# Patient Record
Sex: Female | Born: 1950
Health system: Southern US, Community
[De-identification: ages and names within clinical notes are randomized; demographics above are authoritative.]

## PROBLEM LIST (undated history)

## (undated) DIAGNOSIS — F419 Anxiety disorder, unspecified: Secondary | ICD-10-CM

## (undated) DIAGNOSIS — I10 Essential (primary) hypertension: Secondary | ICD-10-CM

## (undated) DIAGNOSIS — E785 Hyperlipidemia, unspecified: Secondary | ICD-10-CM

## (undated) DIAGNOSIS — F32A Depression, unspecified: Secondary | ICD-10-CM

## (undated) DIAGNOSIS — T7840XA Allergy, unspecified, initial encounter: Secondary | ICD-10-CM

## (undated) DIAGNOSIS — G5601 Carpal tunnel syndrome, right upper limb: Secondary | ICD-10-CM

## (undated) DIAGNOSIS — J302 Other seasonal allergic rhinitis: Secondary | ICD-10-CM

## (undated) DIAGNOSIS — M1990 Unspecified osteoarthritis, unspecified site: Secondary | ICD-10-CM

## (undated) DIAGNOSIS — K635 Polyp of colon: Secondary | ICD-10-CM

## (undated) DIAGNOSIS — Z86010 Personal history of colonic polyps: Secondary | ICD-10-CM

## (undated) DIAGNOSIS — K219 Gastro-esophageal reflux disease without esophagitis: Secondary | ICD-10-CM

## (undated) HISTORY — PX: OVARIAN CYST REMOVAL: SHX89

## (undated) HISTORY — PX: OOPHORECTOMY: SHX86

## (undated) HISTORY — DX: Personal history of colonic polyps: Z86.010

## (undated) HISTORY — DX: Polyp of colon: K63.5

## (undated) HISTORY — DX: Allergy, unspecified, initial encounter: T78.40XA

## (undated) HISTORY — PX: CHOLECYSTECTOMY: SHX55

## (undated) HISTORY — PX: COLONOSCOPY: SHX174

## (undated) HISTORY — PX: TONSILLECTOMY: SUR1361

## (undated) HISTORY — PX: TUBAL LIGATION: SHX77

## (undated) HISTORY — DX: Depression, unspecified: F32.A

## (undated) HISTORY — PX: ABDOMINAL HYSTERECTOMY: SHX81

## (undated) HISTORY — PX: COSMETIC SURGERY: SHX468

## (undated) HISTORY — PX: BREAST SURGERY: SHX581

## (undated) HISTORY — PX: EYE SURGERY: SHX253

## (undated) HISTORY — PX: POLYPECTOMY: SHX149

## (undated) HISTORY — DX: Essential (primary) hypertension: I10

## (undated) HISTORY — DX: Hyperlipidemia, unspecified: E78.5

## (undated) HISTORY — PX: WISDOM TOOTH EXTRACTION: SHX21

## (undated) HISTORY — DX: Gastro-esophageal reflux disease without esophagitis: K21.9

## (undated) HISTORY — PX: APPENDECTOMY: SHX54

---

## 1995-02-17 ENCOUNTER — Encounter: Payer: Self-pay | Admitting: Physician Assistant

## 1998-04-28 ENCOUNTER — Ambulatory Visit (HOSPITAL_COMMUNITY): Admission: RE | Admit: 1998-04-28 | Discharge: 1998-04-28 | Payer: Self-pay | Admitting: *Deleted

## 1998-07-30 ENCOUNTER — Other Ambulatory Visit: Admission: RE | Admit: 1998-07-30 | Discharge: 1998-07-30 | Payer: Self-pay | Admitting: *Deleted

## 1999-02-19 ENCOUNTER — Other Ambulatory Visit: Admission: RE | Admit: 1999-02-19 | Discharge: 1999-02-19 | Payer: Self-pay | Admitting: *Deleted

## 1999-04-07 ENCOUNTER — Inpatient Hospital Stay (HOSPITAL_COMMUNITY): Admission: RE | Admit: 1999-04-07 | Discharge: 1999-04-09 | Payer: Self-pay | Admitting: *Deleted

## 1999-05-20 ENCOUNTER — Encounter: Payer: Self-pay | Admitting: *Deleted

## 1999-05-20 ENCOUNTER — Ambulatory Visit (HOSPITAL_COMMUNITY): Admission: RE | Admit: 1999-05-20 | Discharge: 1999-05-20 | Payer: Self-pay | Admitting: *Deleted

## 1999-06-09 ENCOUNTER — Encounter: Payer: Self-pay | Admitting: *Deleted

## 1999-06-09 ENCOUNTER — Ambulatory Visit (HOSPITAL_COMMUNITY): Admission: RE | Admit: 1999-06-09 | Discharge: 1999-06-09 | Payer: Self-pay | Admitting: *Deleted

## 1999-09-08 ENCOUNTER — Other Ambulatory Visit: Admission: RE | Admit: 1999-09-08 | Discharge: 1999-09-08 | Payer: Self-pay | Admitting: *Deleted

## 2000-06-19 ENCOUNTER — Ambulatory Visit (HOSPITAL_COMMUNITY): Admission: RE | Admit: 2000-06-19 | Discharge: 2000-06-19 | Payer: Self-pay | Admitting: *Deleted

## 2000-06-19 ENCOUNTER — Encounter: Payer: Self-pay | Admitting: *Deleted

## 2000-09-12 ENCOUNTER — Other Ambulatory Visit: Admission: RE | Admit: 2000-09-12 | Discharge: 2000-09-12 | Payer: Self-pay | Admitting: *Deleted

## 2001-07-04 ENCOUNTER — Encounter: Payer: Self-pay | Admitting: *Deleted

## 2001-07-04 ENCOUNTER — Ambulatory Visit (HOSPITAL_COMMUNITY): Admission: RE | Admit: 2001-07-04 | Discharge: 2001-07-04 | Payer: Self-pay | Admitting: *Deleted

## 2001-09-10 ENCOUNTER — Other Ambulatory Visit: Admission: RE | Admit: 2001-09-10 | Discharge: 2001-09-10 | Payer: Self-pay | Admitting: *Deleted

## 2002-07-17 ENCOUNTER — Encounter: Payer: Self-pay | Admitting: *Deleted

## 2002-07-17 ENCOUNTER — Ambulatory Visit (HOSPITAL_COMMUNITY): Admission: RE | Admit: 2002-07-17 | Discharge: 2002-07-17 | Payer: Self-pay | Admitting: *Deleted

## 2002-09-09 ENCOUNTER — Other Ambulatory Visit: Admission: RE | Admit: 2002-09-09 | Discharge: 2002-09-09 | Payer: Self-pay | Admitting: *Deleted

## 2003-07-23 ENCOUNTER — Encounter: Payer: Self-pay | Admitting: *Deleted

## 2003-07-23 ENCOUNTER — Ambulatory Visit (HOSPITAL_COMMUNITY): Admission: RE | Admit: 2003-07-23 | Discharge: 2003-07-23 | Payer: Self-pay | Admitting: *Deleted

## 2003-09-22 ENCOUNTER — Other Ambulatory Visit: Admission: RE | Admit: 2003-09-22 | Discharge: 2003-09-22 | Payer: Self-pay | Admitting: *Deleted

## 2004-07-23 ENCOUNTER — Ambulatory Visit (HOSPITAL_COMMUNITY): Admission: RE | Admit: 2004-07-23 | Discharge: 2004-07-23 | Payer: Self-pay | Admitting: *Deleted

## 2004-09-22 ENCOUNTER — Other Ambulatory Visit: Admission: RE | Admit: 2004-09-22 | Discharge: 2004-09-22 | Payer: Self-pay | Admitting: *Deleted

## 2005-07-25 ENCOUNTER — Ambulatory Visit (HOSPITAL_COMMUNITY): Admission: RE | Admit: 2005-07-25 | Discharge: 2005-07-25 | Payer: Self-pay | Admitting: *Deleted

## 2005-09-22 ENCOUNTER — Other Ambulatory Visit: Admission: RE | Admit: 2005-09-22 | Discharge: 2005-09-22 | Payer: Self-pay | Admitting: *Deleted

## 2006-08-14 ENCOUNTER — Ambulatory Visit (HOSPITAL_COMMUNITY): Admission: RE | Admit: 2006-08-14 | Discharge: 2006-08-14 | Payer: Self-pay | Admitting: *Deleted

## 2006-09-26 ENCOUNTER — Other Ambulatory Visit: Admission: RE | Admit: 2006-09-26 | Discharge: 2006-09-26 | Payer: Self-pay | Admitting: *Deleted

## 2006-12-12 ENCOUNTER — Ambulatory Visit: Payer: Self-pay | Admitting: Internal Medicine

## 2006-12-13 LAB — CONVERTED CEMR LAB
ALT: 25 units/L (ref 0–40)
AST: 29 units/L (ref 0–37)
Basophils Relative: 0.3 % (ref 0.0–1.0)
Bilirubin Urine: NEGATIVE
Eosinophils Relative: 3.3 % (ref 0.0–5.0)
GFR calc Af Amer: 84 mL/min
HDL: 45 mg/dL (ref >39.0)
Hemoglobin, Urine: NEGATIVE
Hemoglobin: 14.3 g/dL (ref 12.0–15.0)
Leukocytes, UA: NEGATIVE
MCHC: 32.8 g/dL (ref 30.0–36.0)
Monocytes Absolute: 0.7 10*3/uL (ref 0.2–0.7)
Neutrophils Relative %: 41.7 % — ABNORMAL LOW (ref 43.0–77.0)
Potassium: 4 meq/L (ref 3.5–5.1)
RBC: 4.46 M/uL (ref 3.87–5.11)
RDW: 12.3 % (ref 11.5–14.6)
Sodium: 141 meq/L (ref 135–145)
Total Bilirubin: 1 mg/dL (ref 0.3–1.2)
Total Protein, Urine: NEGATIVE mg/dL
Total Protein: 7.1 g/dL (ref 6.0–8.3)
Urine Glucose: NEGATIVE mg/dL
Urobilinogen, UA: 0.2 (ref 0.0–1.0)
WBC: 5.6 10*3/uL (ref 4.5–10.5)
pH: 6.5 (ref 5.0–8.0)

## 2006-12-29 ENCOUNTER — Ambulatory Visit: Payer: Self-pay | Admitting: Internal Medicine

## 2007-02-05 ENCOUNTER — Ambulatory Visit: Payer: Self-pay | Admitting: Internal Medicine

## 2007-02-05 LAB — CONVERTED CEMR LAB
ALT: 25 units/L (ref 0–40)
BUN: 7 mg/dL (ref 6–23)
Bilirubin, Direct: 0.2 mg/dL (ref 0.0–0.3)
CO2: 29 meq/L (ref 19–32)
Cholesterol: 190 mg/dL (ref 0–200)
Direct LDL: 121.8 mg/dL
GFR calc non Af Amer: 92 mL/min
HDL: 47.7 mg/dL (ref >39.0)
Potassium: 3.9 meq/L (ref 3.5–5.1)
Total CK: 293 units/L (ref 7–177)
VLDL: 20 mg/dL (ref 0–40)

## 2007-02-09 ENCOUNTER — Ambulatory Visit: Payer: Self-pay | Admitting: Internal Medicine

## 2007-05-15 ENCOUNTER — Ambulatory Visit: Payer: Self-pay | Admitting: Internal Medicine

## 2007-05-15 LAB — CONVERTED CEMR LAB
Cholesterol: 240 mg/dL (ref 0–200)
VLDL: 27 mg/dL (ref 0–40)

## 2007-05-18 ENCOUNTER — Ambulatory Visit: Payer: Self-pay | Admitting: Internal Medicine

## 2007-08-17 ENCOUNTER — Ambulatory Visit (HOSPITAL_COMMUNITY): Admission: RE | Admit: 2007-08-17 | Discharge: 2007-08-17 | Payer: Self-pay | Admitting: *Deleted

## 2007-09-03 ENCOUNTER — Other Ambulatory Visit: Admission: RE | Admit: 2007-09-03 | Discharge: 2007-09-03 | Payer: Self-pay | Admitting: *Deleted

## 2007-11-06 IMAGING — MG MM DIGITAL SCREENING BILAT
3 series · 3 of 3 positions shown · non-contrast
Comparison: none

DG SCREEN MAMMOGRAM BILATERAL
Bilateral CC and MLO view(s) were taken.
Prior study comparison: July 23, 2004, bilateral screening mammogram.

DIGITAL SCREENING MAMMOGRAM WITH CAD:
The breast tissue is almost entirely fatty.  There is no dominant mass, architectural distortion or
calcification to suggest malignancy.

[R MLO]
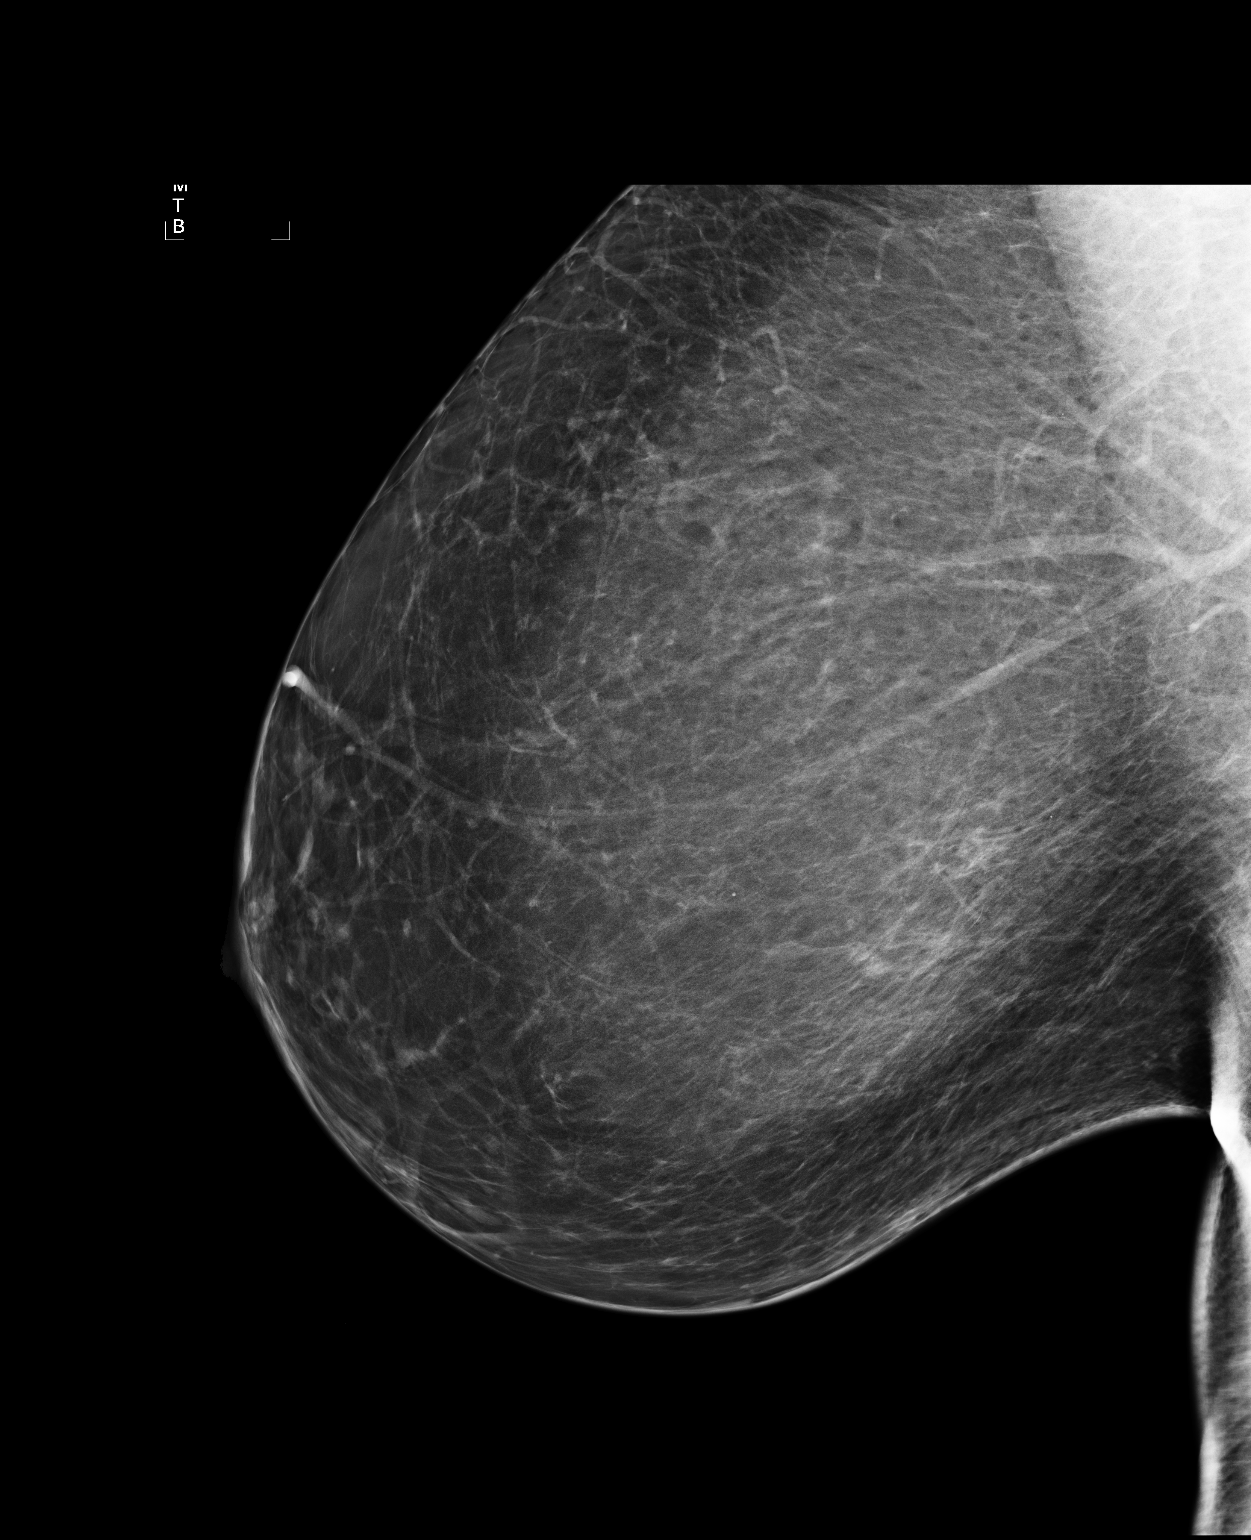

[L CC]
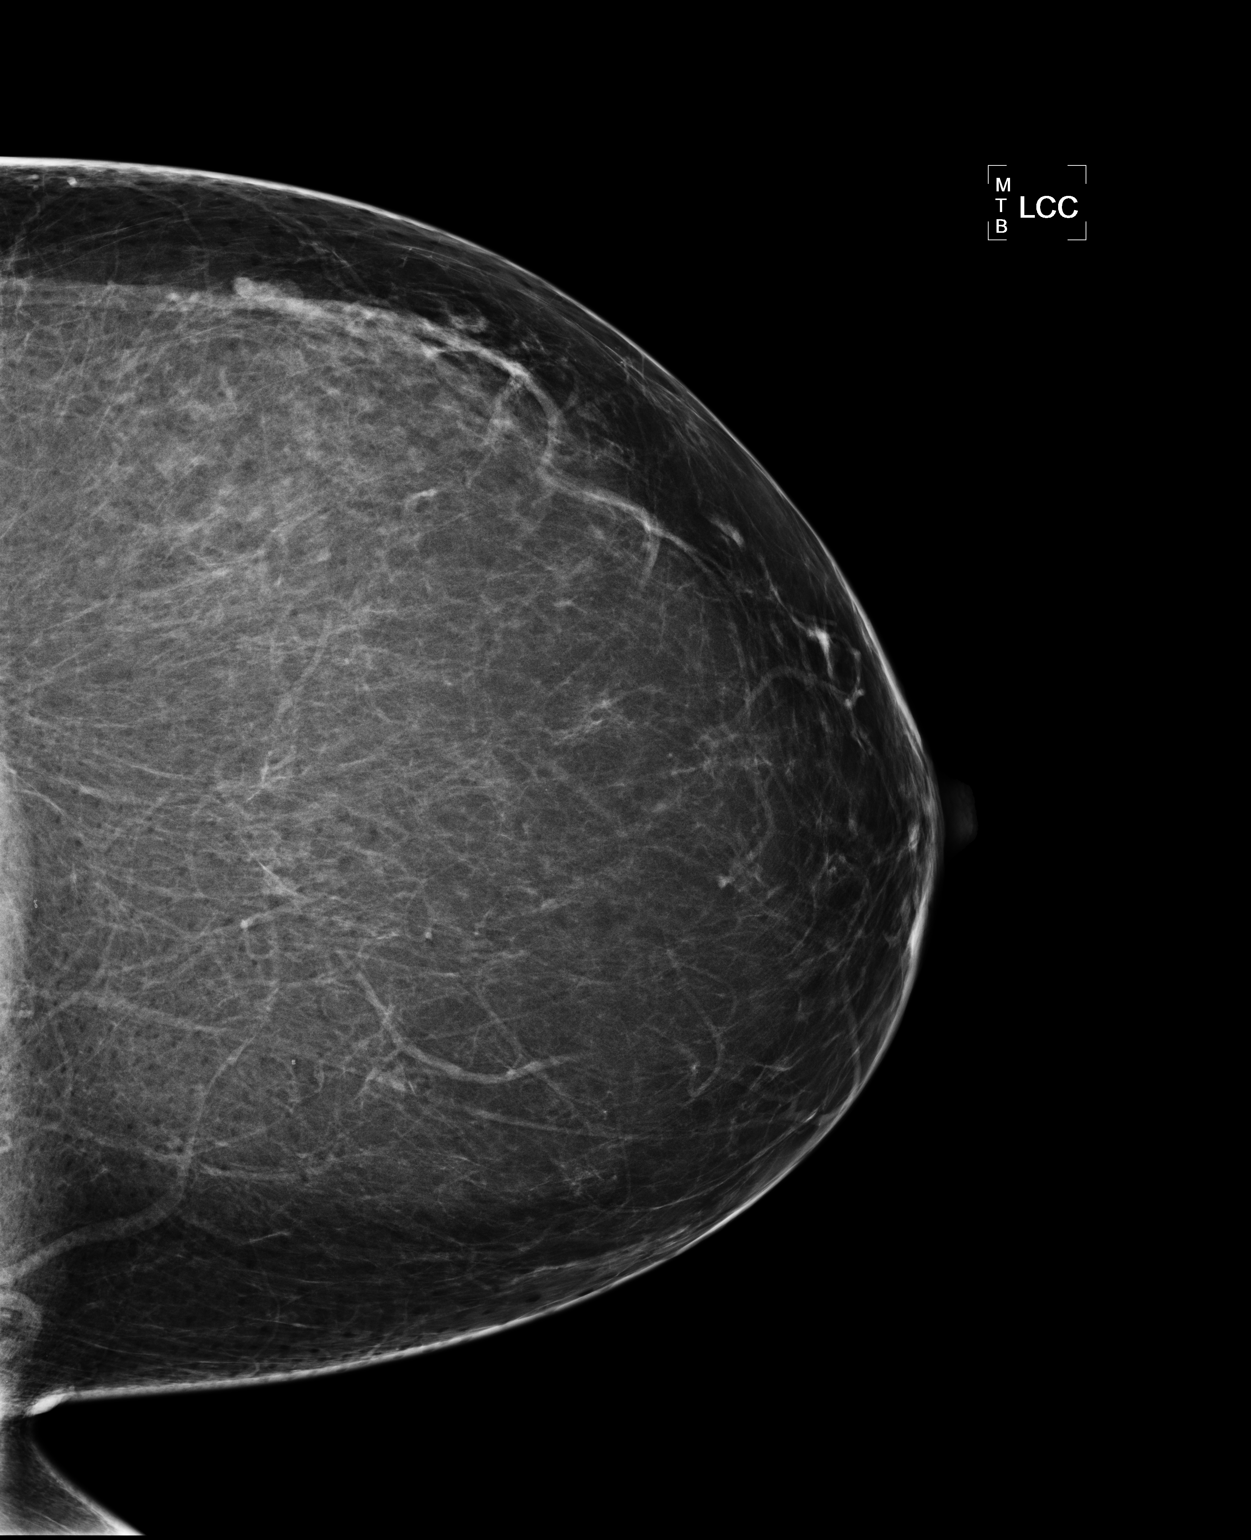

[L MLO]
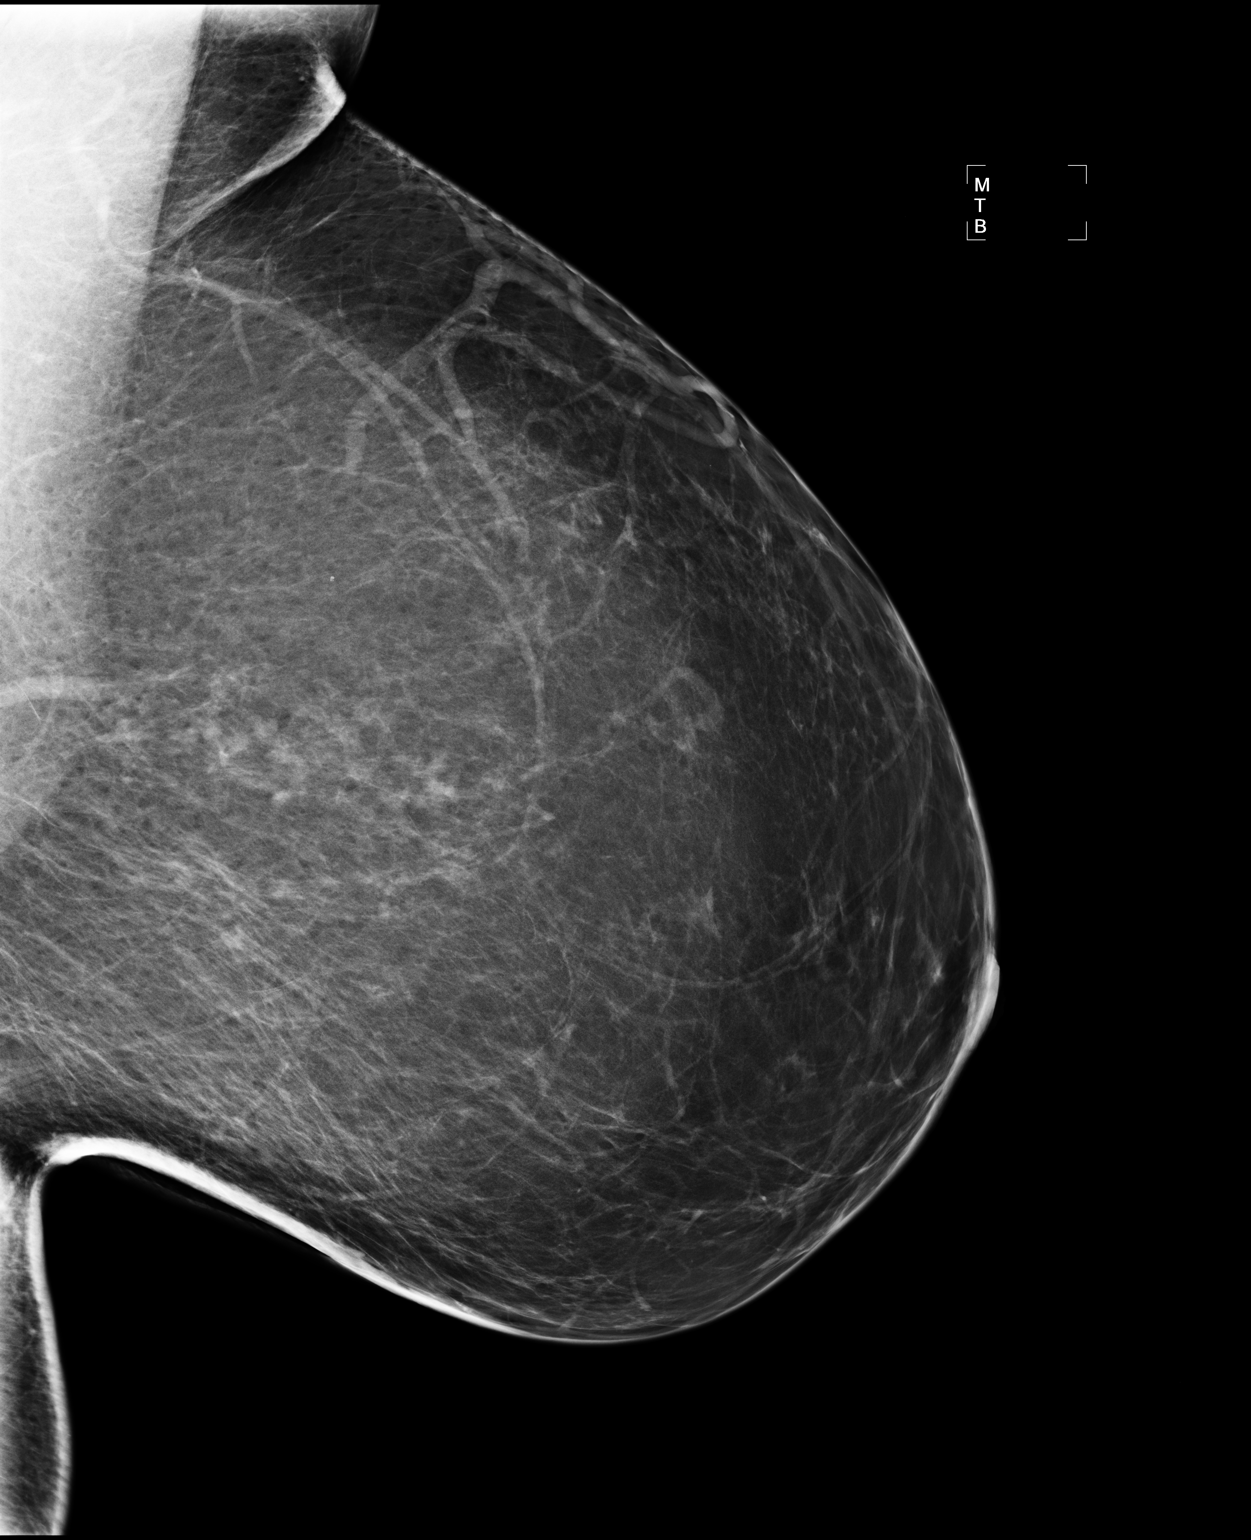

[3 of 3 positions shown; findings below may reference images not displayed]

IMPRESSION: No mammographic evidence of malignancy.  Suggest yearly screening mammography.

ASSESSMENT: Negative - BI-RADS 1

Screening mammogram in 1 year.
ANALYZED BY COMPUTER AIDED DETECTION. , THIS PROCEDURE WAS A DIGITAL MAMMOGRAM.

## 2008-02-08 ENCOUNTER — Encounter (INDEPENDENT_AMBULATORY_CARE_PROVIDER_SITE_OTHER): Payer: Self-pay | Admitting: *Deleted

## 2008-02-08 ENCOUNTER — Ambulatory Visit: Payer: Self-pay | Admitting: Internal Medicine

## 2008-04-22 ENCOUNTER — Encounter: Payer: Self-pay | Admitting: Internal Medicine

## 2008-05-09 ENCOUNTER — Encounter: Payer: Self-pay | Admitting: Internal Medicine

## 2008-06-13 ENCOUNTER — Ambulatory Visit (HOSPITAL_BASED_OUTPATIENT_CLINIC_OR_DEPARTMENT_OTHER): Admission: RE | Admit: 2008-06-13 | Discharge: 2008-06-13 | Payer: Self-pay | Admitting: Internal Medicine

## 2008-06-13 ENCOUNTER — Ambulatory Visit: Payer: Self-pay | Admitting: Internal Medicine

## 2008-08-07 ENCOUNTER — Telehealth: Payer: Self-pay | Admitting: Internal Medicine

## 2008-08-13 ENCOUNTER — Ambulatory Visit: Payer: Self-pay | Admitting: Internal Medicine

## 2008-08-19 ENCOUNTER — Ambulatory Visit (HOSPITAL_COMMUNITY): Admission: RE | Admit: 2008-08-19 | Discharge: 2008-08-19 | Payer: Self-pay | Admitting: Gynecology

## 2008-09-02 ENCOUNTER — Other Ambulatory Visit: Admission: RE | Admit: 2008-09-02 | Discharge: 2008-09-02 | Payer: Self-pay | Admitting: Gynecology

## 2008-09-12 ENCOUNTER — Ambulatory Visit: Payer: Self-pay | Admitting: Internal Medicine

## 2008-09-12 ENCOUNTER — Ambulatory Visit (HOSPITAL_BASED_OUTPATIENT_CLINIC_OR_DEPARTMENT_OTHER): Admission: RE | Admit: 2008-09-12 | Discharge: 2008-09-12 | Payer: Self-pay | Admitting: Internal Medicine

## 2008-09-12 DIAGNOSIS — E049 Nontoxic goiter, unspecified: Secondary | ICD-10-CM | POA: Insufficient documentation

## 2008-09-12 DIAGNOSIS — E010 Iodine-deficiency related diffuse (endemic) goiter: Secondary | ICD-10-CM | POA: Insufficient documentation

## 2008-09-12 LAB — CONVERTED CEMR LAB: TSH: 0.44 microintl units/mL (ref 0.35–5.50)

## 2008-09-14 ENCOUNTER — Telehealth: Payer: Self-pay | Admitting: Internal Medicine

## 2008-09-22 LAB — CONVERTED CEMR LAB: Pap Smear: NORMAL

## 2008-11-08 IMAGING — MG MM DIGITAL SCREENING BILAT
4 series · 4 of 4 positions shown · non-contrast
Comparison: none

DG SCREEN MAMMOGRAM BILATERAL
Bilateral CC and MLO view(s) were taken.

DIGITAL SCREENING MAMMOGRAM WITH CAD:
There are scattered fibroglandular densities.  No masses or malignant type calcifications are 
identified.  Compared with prior studies.

[R CC]
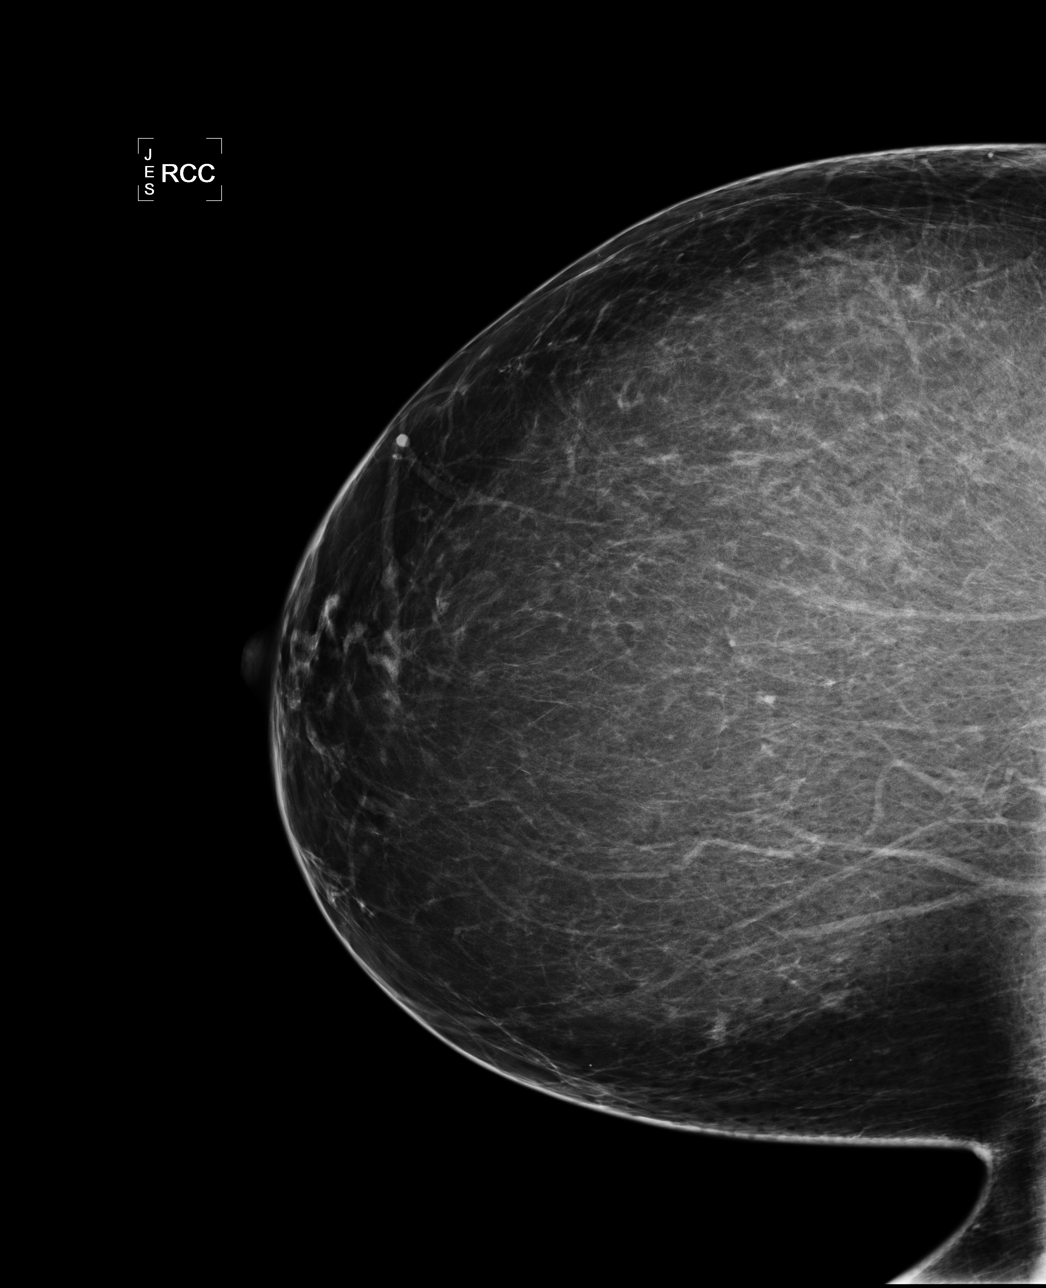

[R MLO]
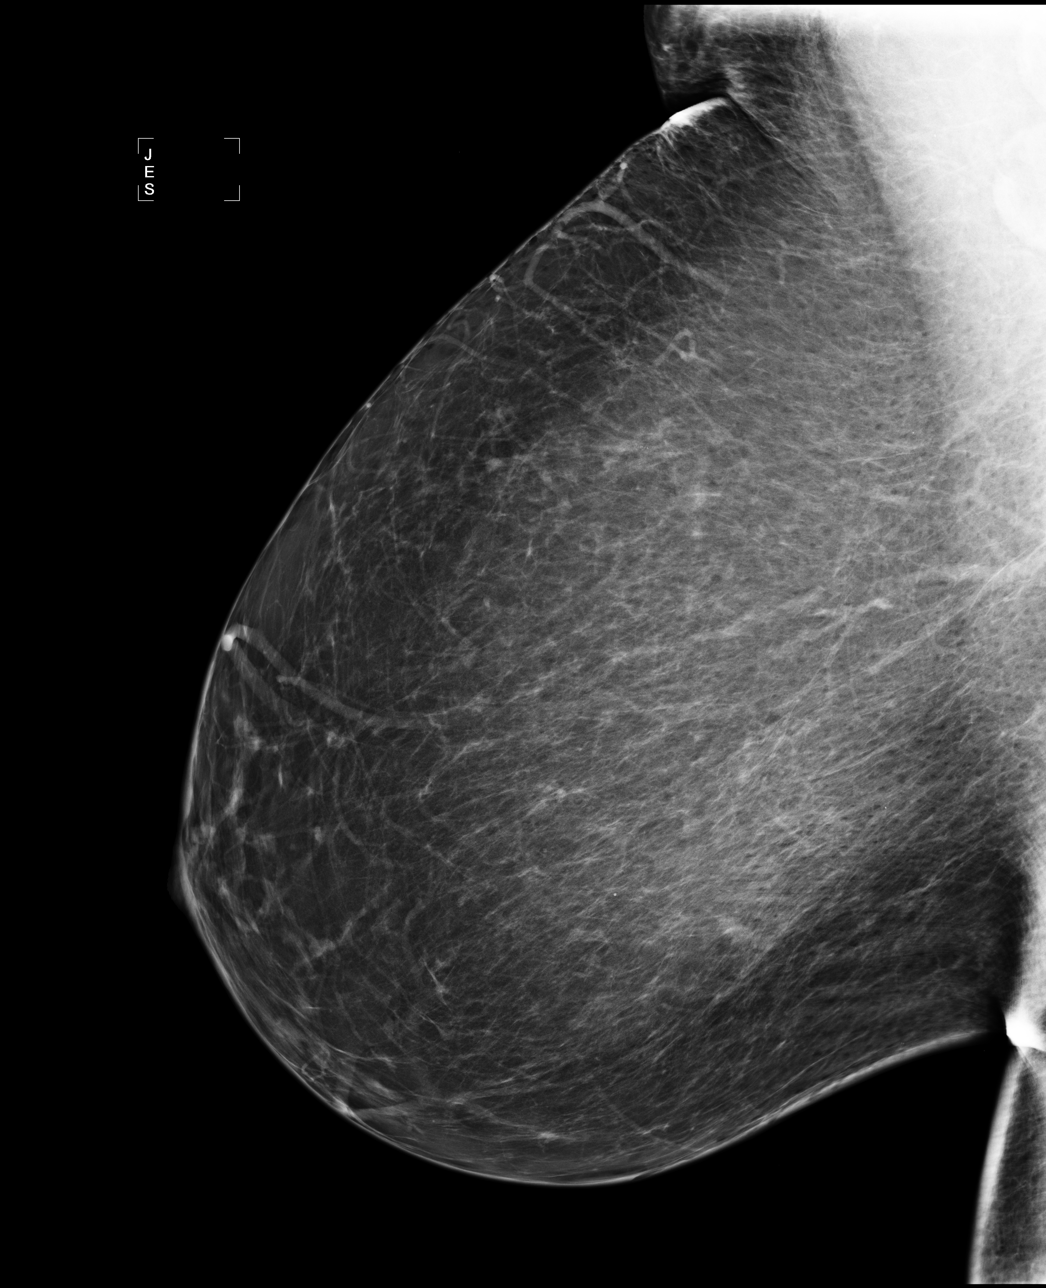

[L CC]
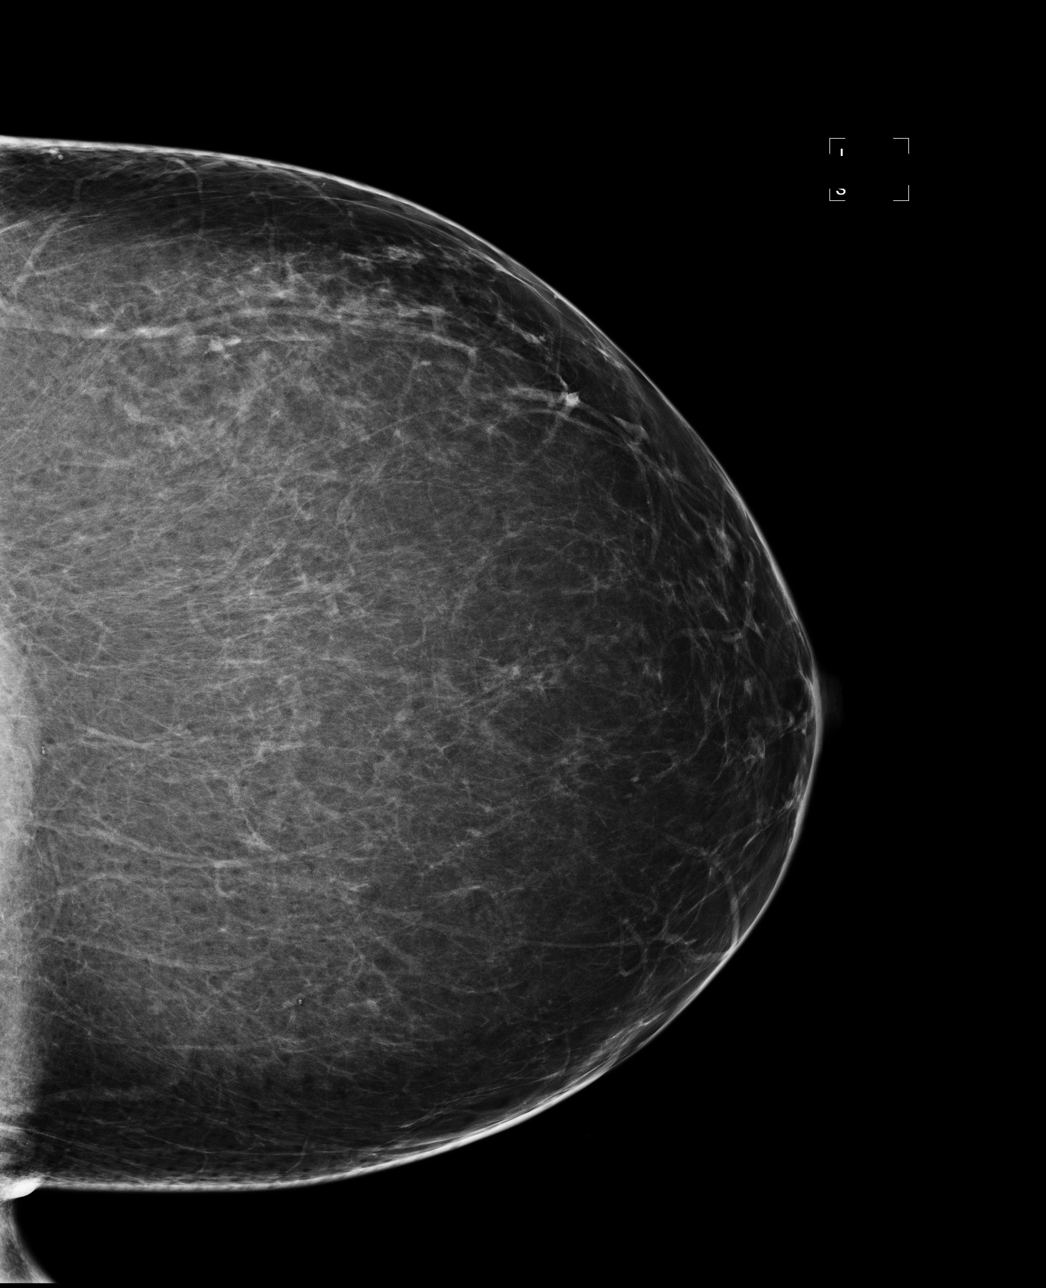

[L MLO]
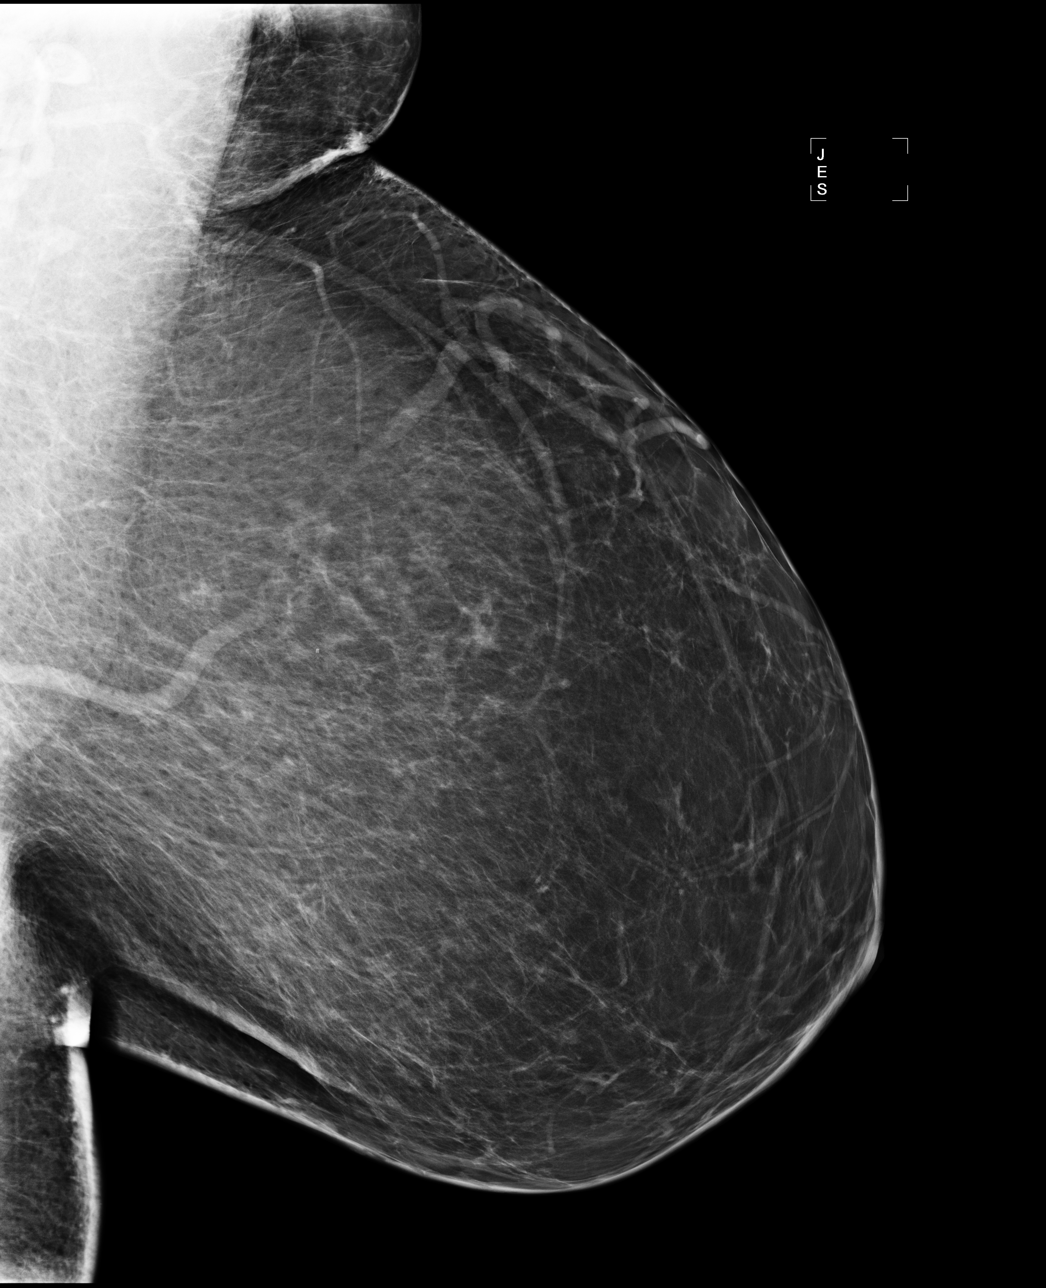

[4 of 4 positions shown; findings below may reference images not displayed]

IMPRESSION: No specific mammographic evidence of malignancy.  Next screening mammogram is recommended in one 
year.

ASSESSMENT: Negative - BI-RADS 1

Screening mammogram in 1 year.
ANALYZED BY COMPUTER AIDED DETECTION. , THIS PROCEDURE WAS A DIGITAL MAMMOGRAM.

## 2008-11-28 DIAGNOSIS — Z860101 Personal history of adenomatous and serrated colon polyps: Secondary | ICD-10-CM

## 2008-11-28 DIAGNOSIS — Z86010 Personal history of colonic polyps: Secondary | ICD-10-CM

## 2008-11-28 HISTORY — DX: Personal history of adenomatous and serrated colon polyps: Z86.0101

## 2008-11-28 HISTORY — DX: Personal history of colonic polyps: Z86.010

## 2008-12-08 ENCOUNTER — Ambulatory Visit: Payer: Self-pay | Admitting: Internal Medicine

## 2008-12-08 LAB — CONVERTED CEMR LAB
CO2: 29 meq/L (ref 19–32)
Creatinine, Ser: 0.7 mg/dL (ref 0.4–1.2)
GFR calc non Af Amer: 92 mL/min
Glucose, Bld: 94 mg/dL (ref 70–99)
Hgb A1c MFr Bld: 6.2 % — ABNORMAL HIGH (ref 4.6–6.0)
Sodium: 138 meq/L (ref 135–145)

## 2008-12-15 ENCOUNTER — Ambulatory Visit: Payer: Self-pay | Admitting: Internal Medicine

## 2008-12-15 DIAGNOSIS — N951 Menopausal and female climacteric states: Secondary | ICD-10-CM | POA: Insufficient documentation

## 2009-03-05 ENCOUNTER — Ambulatory Visit: Payer: Self-pay | Admitting: Internal Medicine

## 2009-03-05 LAB — CONVERTED CEMR LAB
Cholesterol: 224 mg/dL — ABNORMAL HIGH (ref 0–200)
HDL: 45.4 mg/dL (ref >39.00)
Total CHOL/HDL Ratio: 5

## 2009-03-13 ENCOUNTER — Ambulatory Visit: Payer: Self-pay | Admitting: Internal Medicine

## 2009-06-23 ENCOUNTER — Ambulatory Visit: Payer: Self-pay | Admitting: Gastroenterology

## 2009-06-23 DIAGNOSIS — E785 Hyperlipidemia, unspecified: Secondary | ICD-10-CM | POA: Insufficient documentation

## 2009-06-23 DIAGNOSIS — I10 Essential (primary) hypertension: Secondary | ICD-10-CM | POA: Insufficient documentation

## 2009-06-23 DIAGNOSIS — K219 Gastro-esophageal reflux disease without esophagitis: Secondary | ICD-10-CM | POA: Insufficient documentation

## 2009-06-25 LAB — CONVERTED CEMR LAB
AST: 27 units/L (ref 0–37)
Alkaline Phosphatase: 87 units/L (ref 39–117)
Basophils Absolute: 0 10*3/uL (ref 0.0–0.1)
Basophils Relative: 0.4 % (ref 0.0–3.0)
Calcium: 9.3 mg/dL (ref 8.4–10.5)
Creatinine, Ser: 0.7 mg/dL (ref 0.4–1.2)
Glucose, Bld: 93 mg/dL (ref 70–99)
Hemoglobin: 13.6 g/dL (ref 12.0–15.0)
Lymphocytes Relative: 23.8 % (ref 12.0–46.0)
Lymphs Abs: 2.7 10*3/uL (ref 0.7–4.0)
MCV: 95.8 fL (ref 78.0–100.0)
Monocytes Absolute: 0.4 10*3/uL (ref 0.1–1.0)
Monocytes Relative: 3.9 % (ref 3.0–12.0)
Neutro Abs: 6.9 10*3/uL (ref 1.4–7.7)
Neutrophils Relative %: 60.2 % (ref 43.0–77.0)
Platelets: 222 10*3/uL (ref 150.0–400.0)
Potassium: 3.4 meq/L — ABNORMAL LOW (ref 3.5–5.1)
Total Protein: 7.3 g/dL (ref 6.0–8.3)
WBC: 11.3 10*3/uL — ABNORMAL HIGH (ref 4.5–10.5)

## 2009-07-10 ENCOUNTER — Ambulatory Visit: Payer: Self-pay | Admitting: Internal Medicine

## 2009-07-10 LAB — CONVERTED CEMR LAB
BUN: 9 mg/dL (ref 6–23)
Calcium: 9.1 mg/dL (ref 8.4–10.5)
Cholesterol: 247 mg/dL — ABNORMAL HIGH (ref 0–200)
Glucose, Bld: 93 mg/dL (ref 70–99)
HDL: 42.5 mg/dL (ref >39.00)
Sodium: 142 meq/L (ref 135–145)
Total CHOL/HDL Ratio: 6
Triglycerides: 101 mg/dL (ref 0.0–149.0)
VLDL: 20.2 mg/dL (ref 0.0–40.0)

## 2009-07-15 ENCOUNTER — Encounter: Payer: Self-pay | Admitting: Internal Medicine

## 2009-07-17 ENCOUNTER — Telehealth: Payer: Self-pay | Admitting: Internal Medicine

## 2009-07-17 ENCOUNTER — Ambulatory Visit: Payer: Self-pay | Admitting: Internal Medicine

## 2009-07-17 DIAGNOSIS — R002 Palpitations: Secondary | ICD-10-CM | POA: Insufficient documentation

## 2009-07-17 LAB — CONVERTED CEMR LAB
Free T4: 0.86 ng/dL (ref 0.80–1.80)
TSH: 0.923 microintl units/mL (ref 0.350–4.500)

## 2009-07-20 ENCOUNTER — Ambulatory Visit (HOSPITAL_COMMUNITY): Admission: RE | Admit: 2009-07-20 | Discharge: 2009-07-20 | Payer: Self-pay | Admitting: Internal Medicine

## 2009-07-20 ENCOUNTER — Ambulatory Visit: Payer: Self-pay | Admitting: Internal Medicine

## 2009-07-20 ENCOUNTER — Encounter: Payer: Self-pay | Admitting: Internal Medicine

## 2009-07-20 DIAGNOSIS — Z86010 Personal history of colonic polyps: Secondary | ICD-10-CM | POA: Insufficient documentation

## 2009-07-20 DIAGNOSIS — Z860101 Personal history of adenomatous and serrated colon polyps: Secondary | ICD-10-CM | POA: Insufficient documentation

## 2009-07-26 ENCOUNTER — Encounter: Payer: Self-pay | Admitting: Internal Medicine

## 2009-08-19 ENCOUNTER — Telehealth: Payer: Self-pay | Admitting: Internal Medicine

## 2009-08-20 ENCOUNTER — Ambulatory Visit (HOSPITAL_COMMUNITY): Admission: RE | Admit: 2009-08-20 | Discharge: 2009-08-20 | Payer: Self-pay | Admitting: Gynecology

## 2009-09-05 IMAGING — CR DG CHEST 2V
2 series · 2 of 2 positions shown · non-contrast
Comparison: None

CLINICAL DATA: Cough

CHEST - 2 VIEW

[w chest pa]
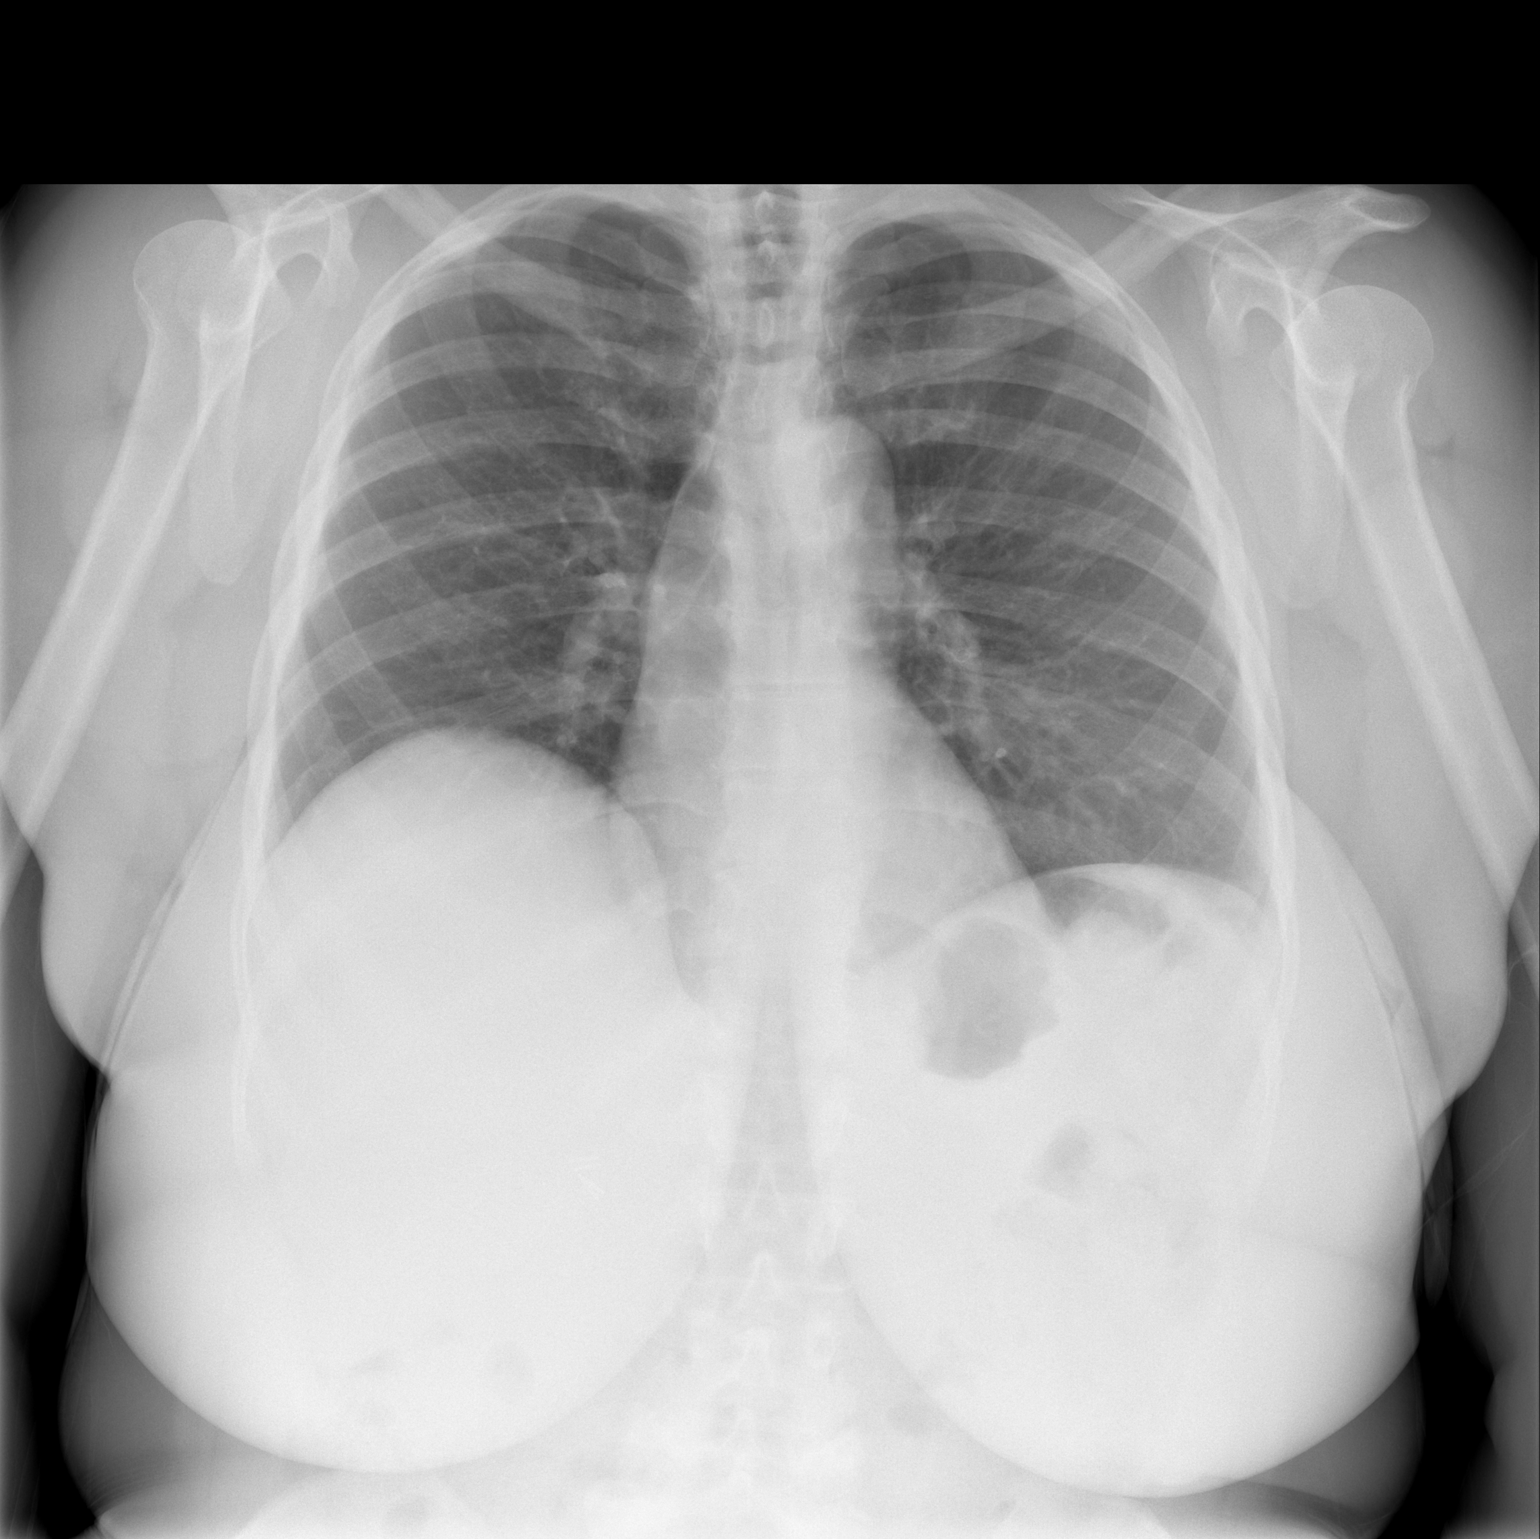

[w chest lat]
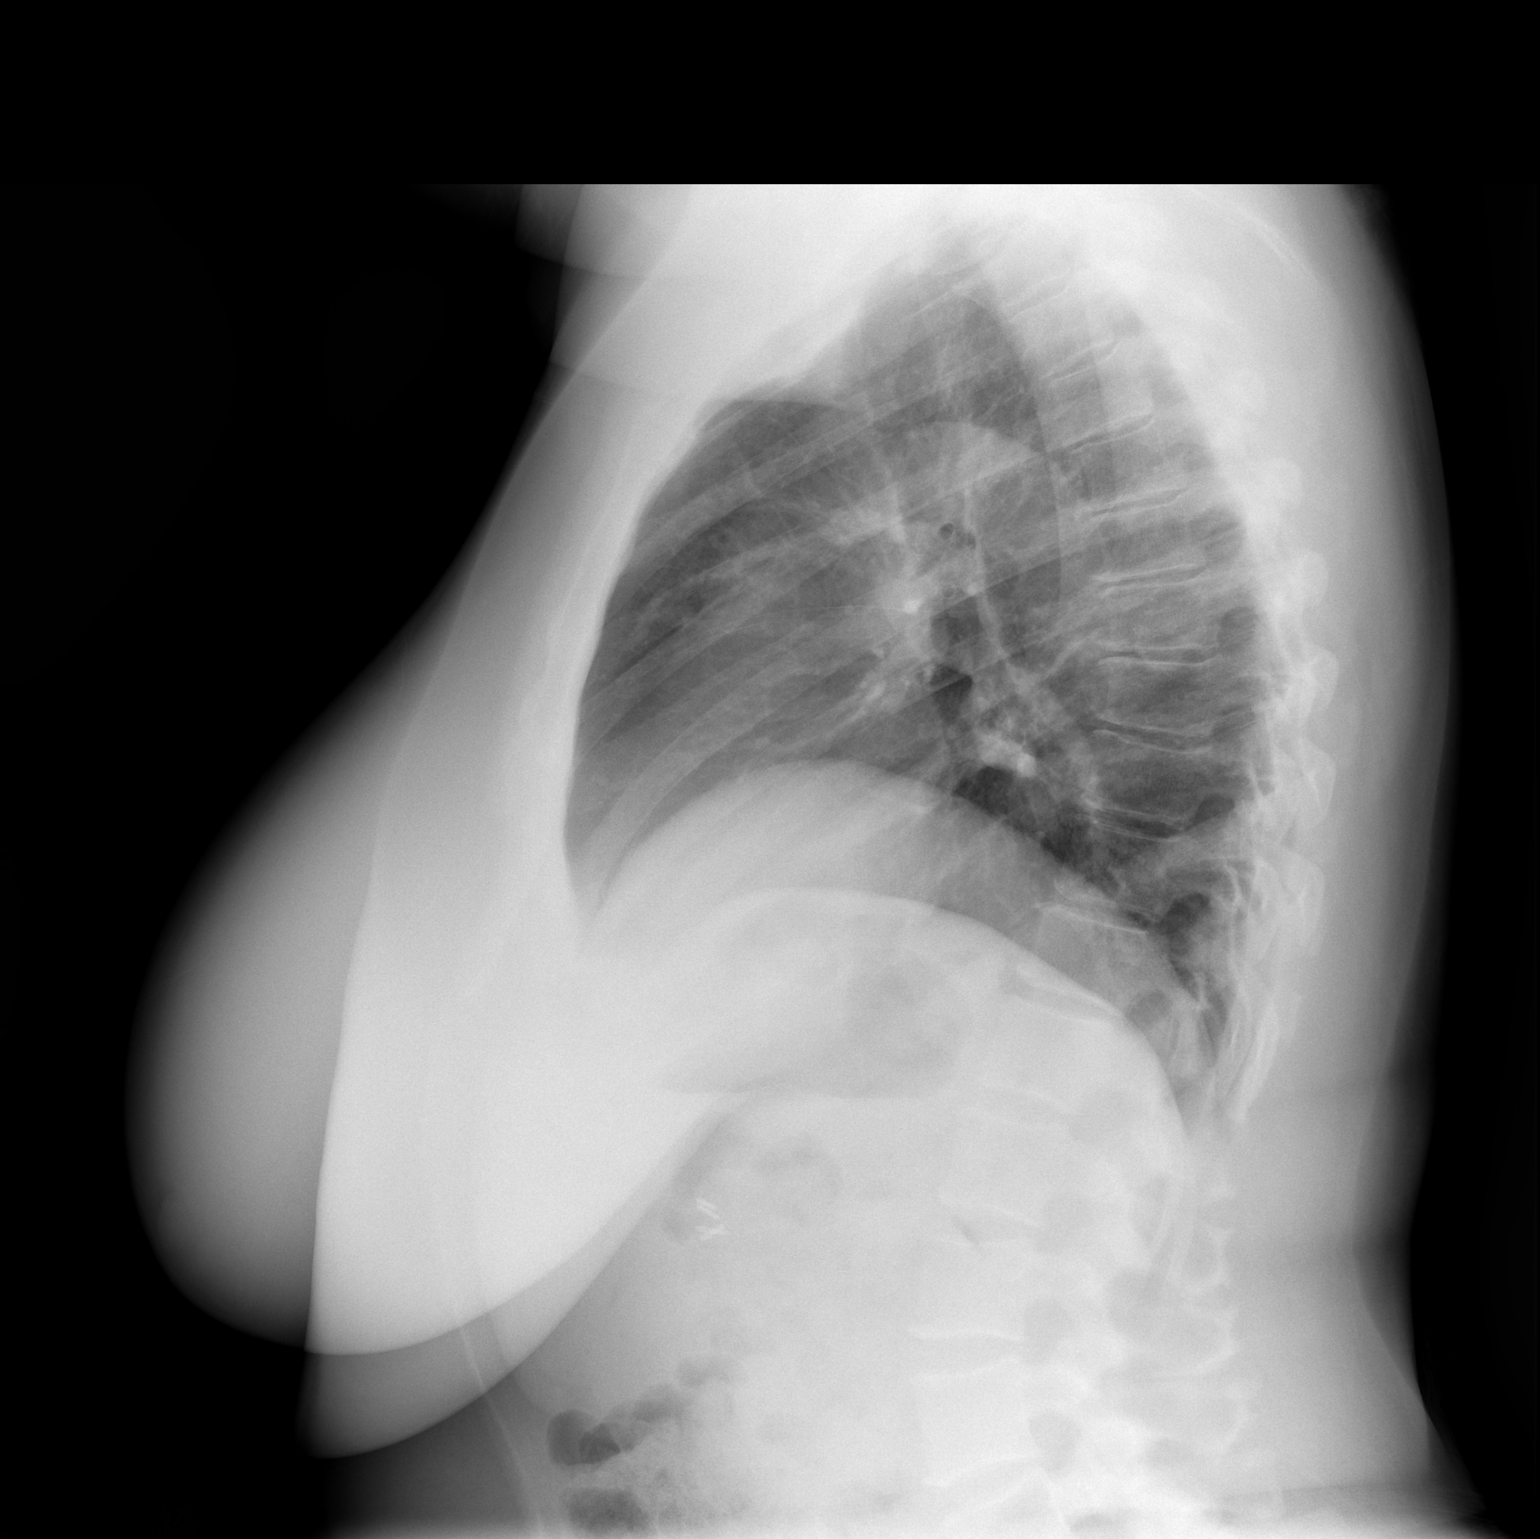

[2 of 2 positions shown; findings below may reference images not displayed]

FINDINGS: The cardiomediastinal silhouette is unremarkable.  No
acute infiltrate or pleural effusion.  No pulmonary edema.  Mild
elevation of the right hemidiaphragm.
IMPRESSION: No acute infiltrate or edema.  Mild elevation of the right
hemidiaphragm.

## 2009-10-07 ENCOUNTER — Ambulatory Visit: Payer: Self-pay | Admitting: Internal Medicine

## 2009-10-07 LAB — CONVERTED CEMR LAB
BUN: 7 mg/dL (ref 6–23)
CO2: 29 meq/L (ref 19–32)
Calcium: 9.4 mg/dL (ref 8.4–10.5)
GFR calc non Af Amer: 94.82 mL/min (ref >60)
Sodium: 142 meq/L (ref 135–145)

## 2009-10-16 ENCOUNTER — Ambulatory Visit: Payer: Self-pay | Admitting: Internal Medicine

## 2009-10-16 DIAGNOSIS — F4321 Adjustment disorder with depressed mood: Secondary | ICD-10-CM | POA: Insufficient documentation

## 2009-10-16 DIAGNOSIS — F4320 Adjustment disorder, unspecified: Secondary | ICD-10-CM | POA: Insufficient documentation

## 2009-11-11 IMAGING — MG MM DIGITAL SCREENING BILAT
4 series · 4 of 4 positions shown · non-contrast
Comparison: none

DG SCREEN MAMMOGRAM BILATERAL
Bilateral CC and MLO view(s) were taken.
Technologist: Arejola, Hengwa.([facility])(M)

DIGITAL SCREENING MAMMOGRAM WITH CAD:
The breast tissue is almost entirely fatty.  No masses or malignant type calcifications are 
identified.  Compared with prior studies.

[R CC]
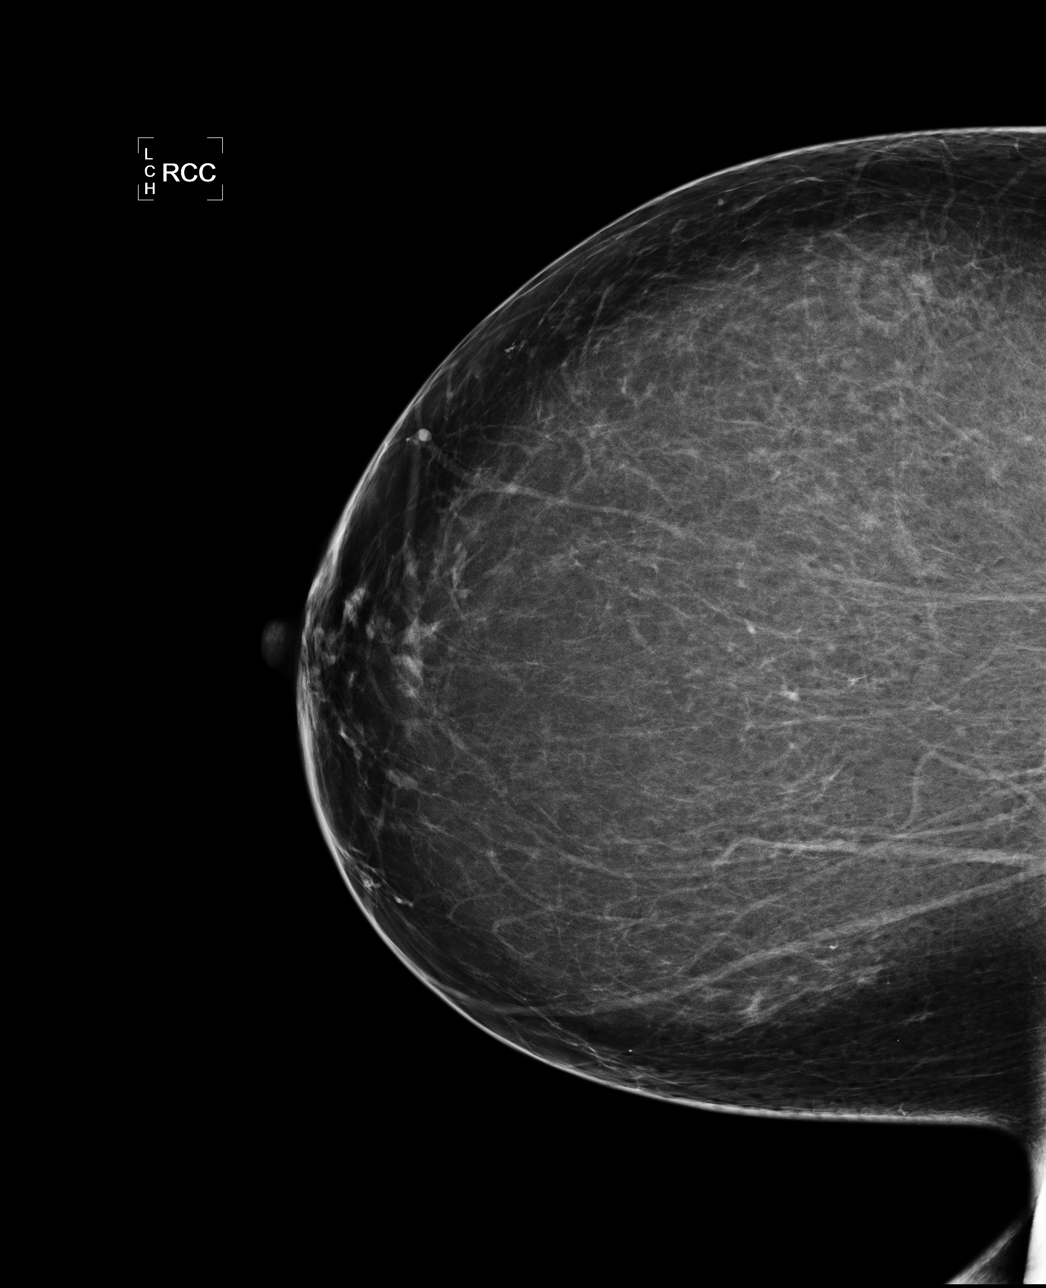

[R MLO]
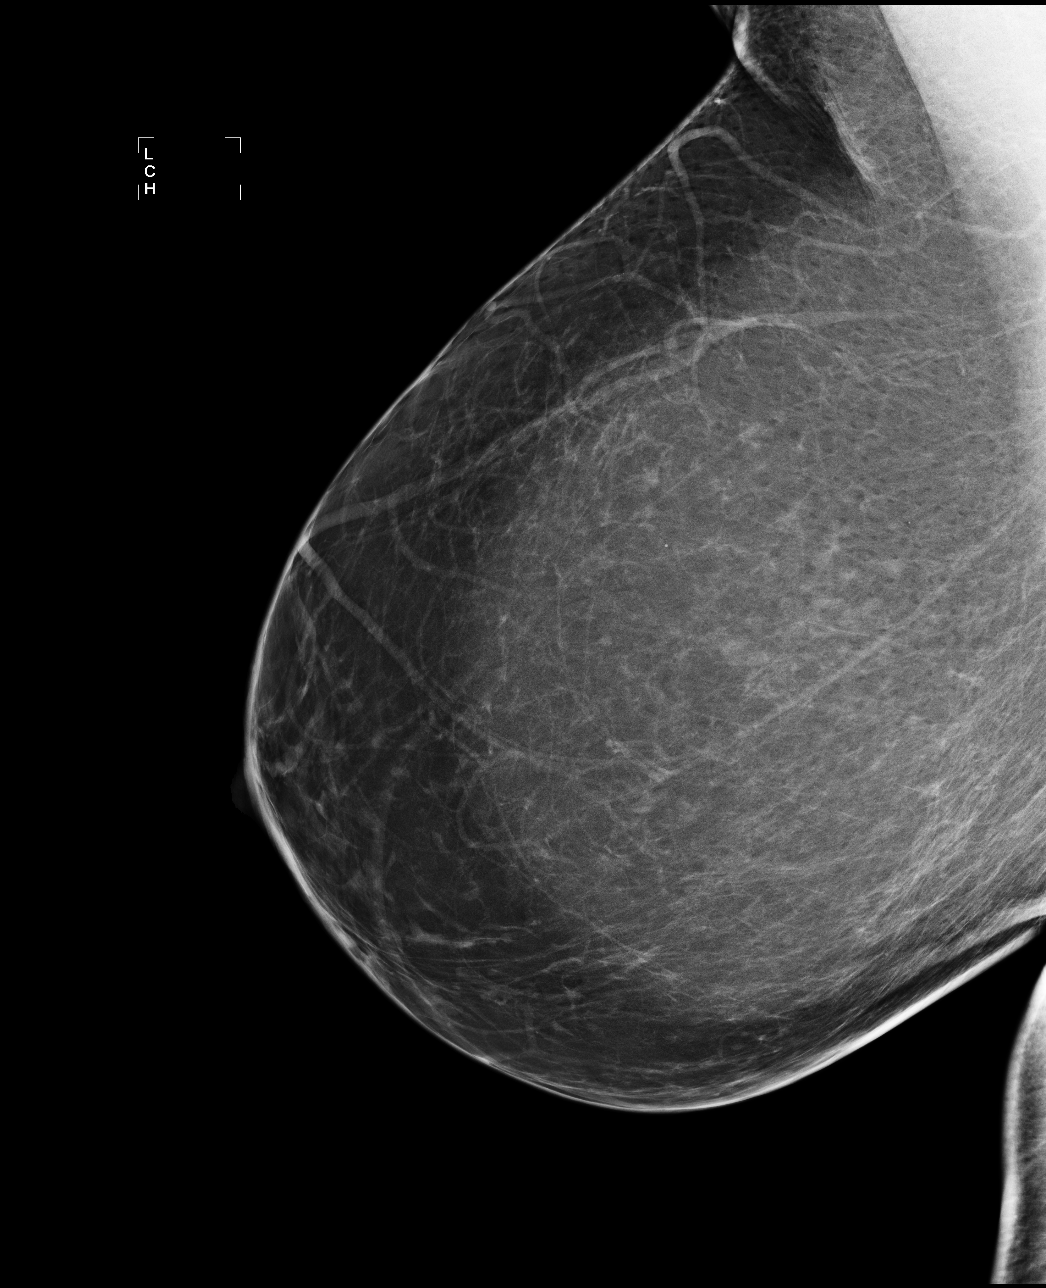

[L CC]
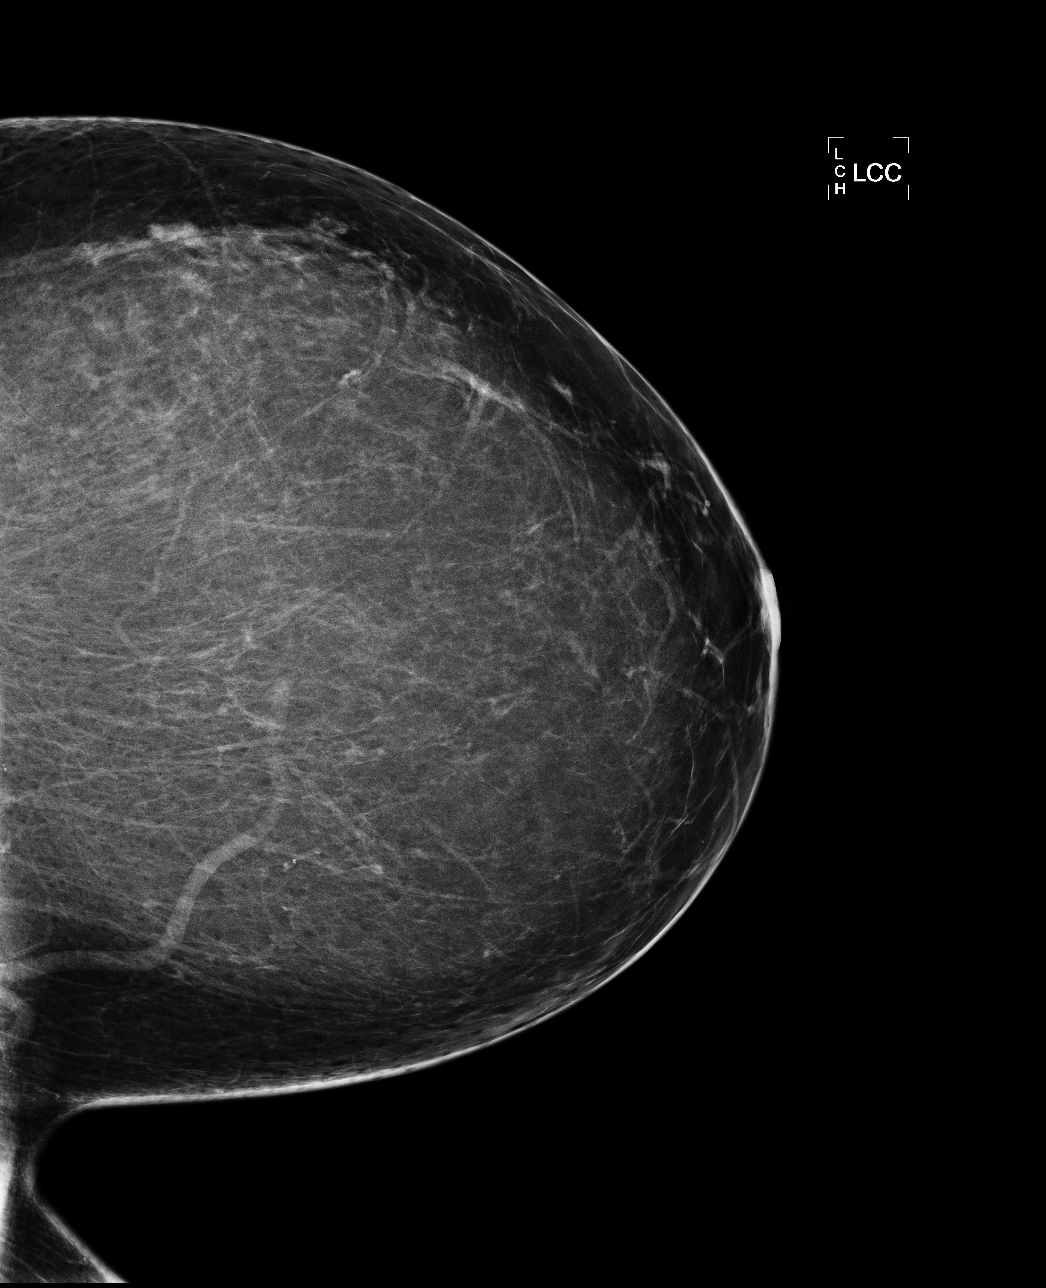

[L MLO]
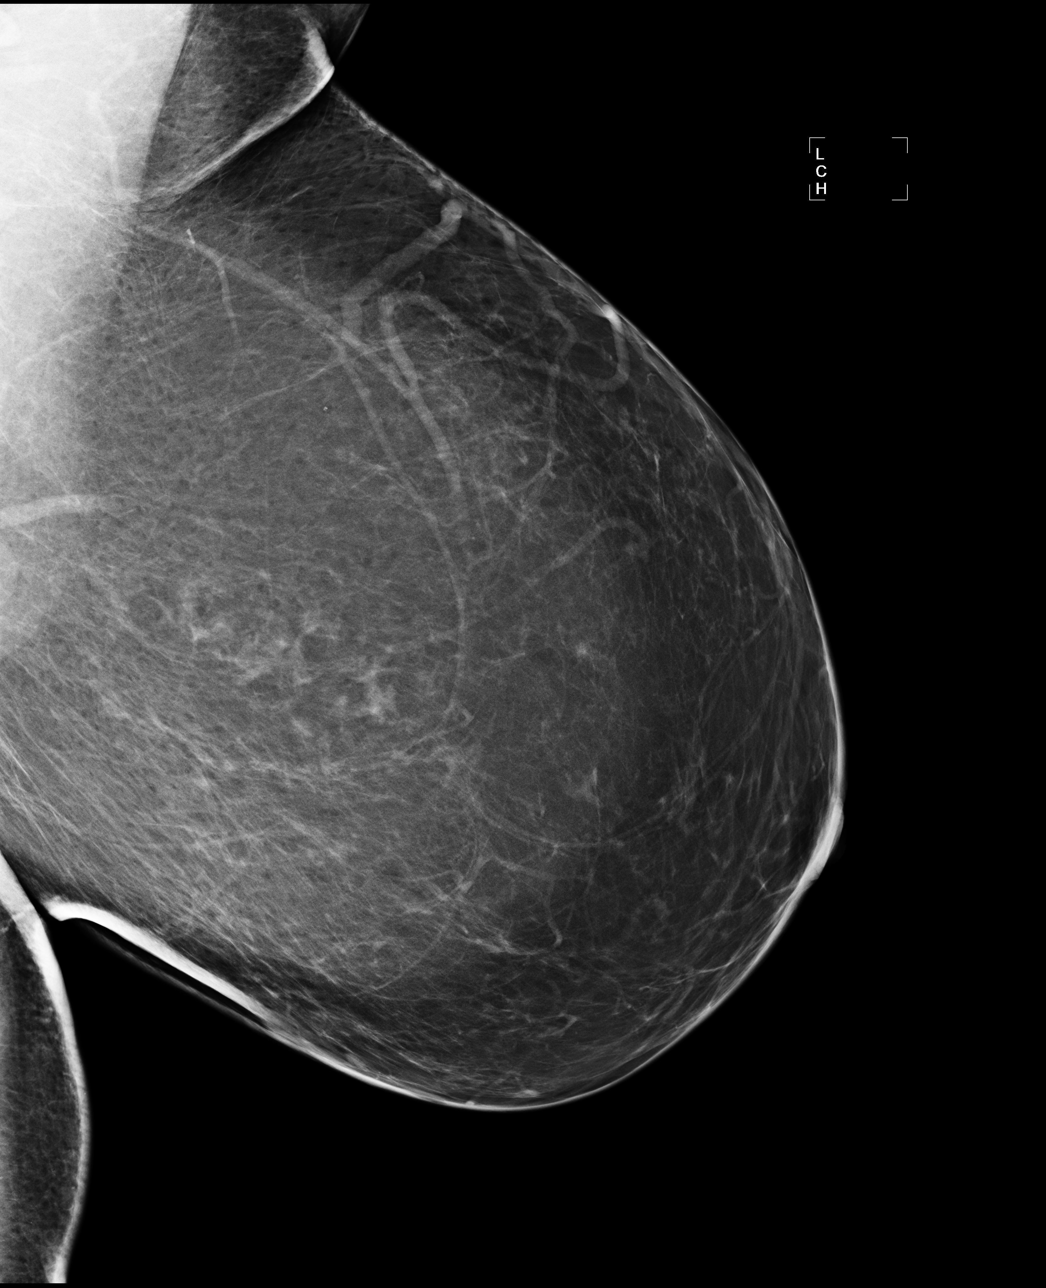

[4 of 4 positions shown; findings below may reference images not displayed]

IMPRESSION: No specific mammographic evidence of malignancy.  Next screening mammogram is recommended in one 
year.

ASSESSMENT: Negative - BI-RADS 1

Screening mammogram in 1 year.
ANALYZED BY COMPUTER AIDED DETECTION. , THIS PROCEDURE WAS A DIGITAL MAMMOGRAM.

## 2009-12-05 IMAGING — US US SOFT TISSUE HEAD/NECK
1 series · 14 of 25 positions shown · non-contrast
Comparison: None

CLINICAL DATA: Thyromegaly

THYROID ULTRASOUND
TECHNIQUE: Ultrasound examination of the thyroid gland and
adjacent soft tissues was performed.

[Series 1: us soft tissue head/neck · 0.07mm/px · 25 acquisitions, 14 frames shown]
[im 1/25]
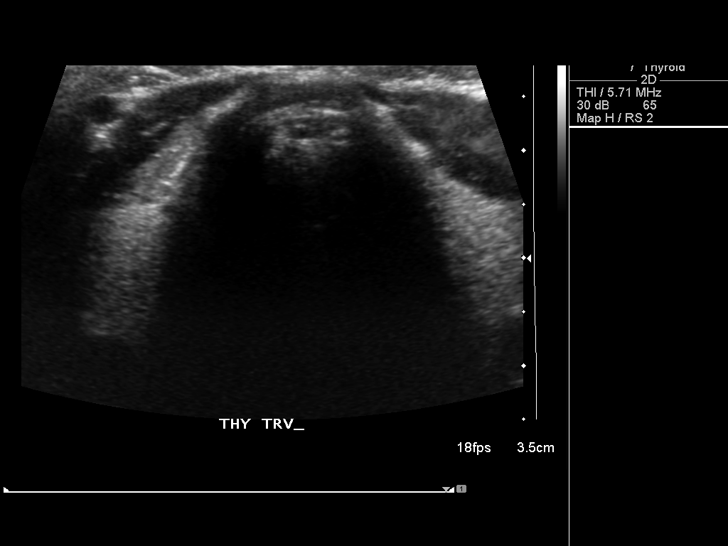
[im 3/25]
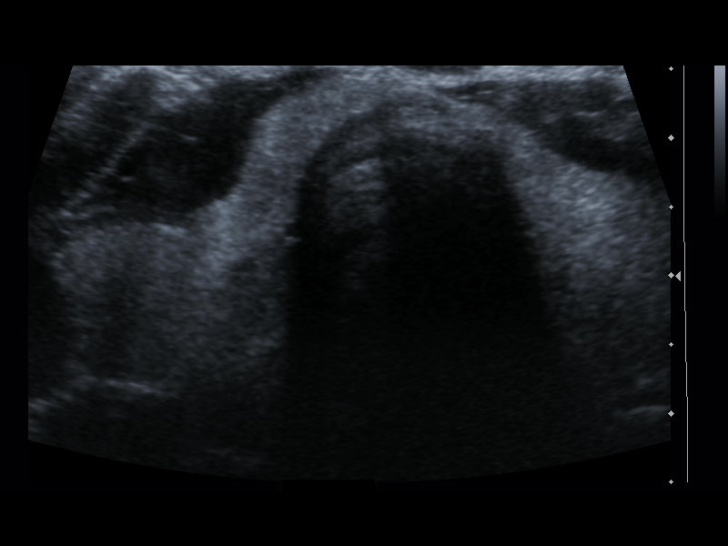
[im 5/25]
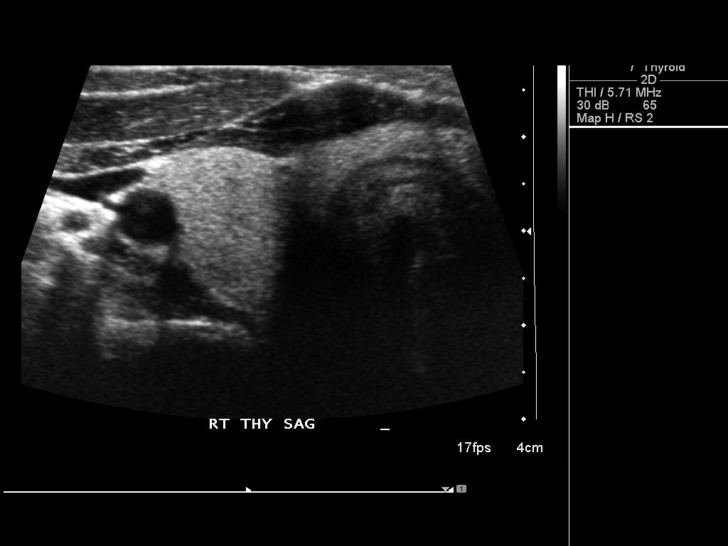
[im 7/25]
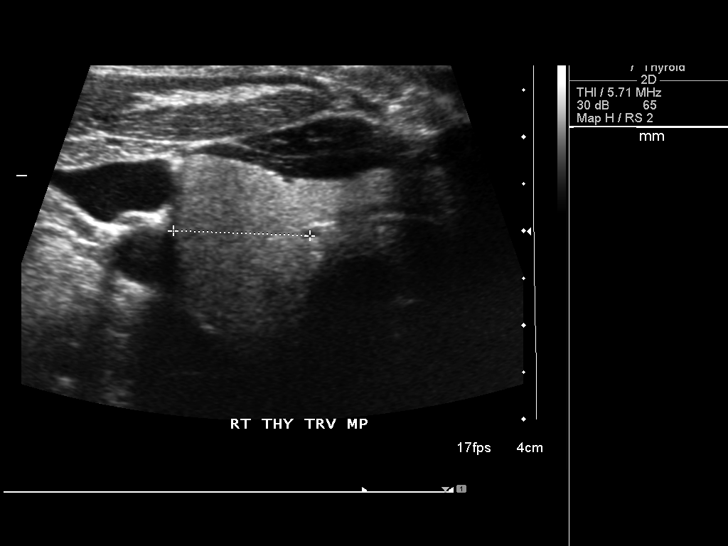
[im 9/25]
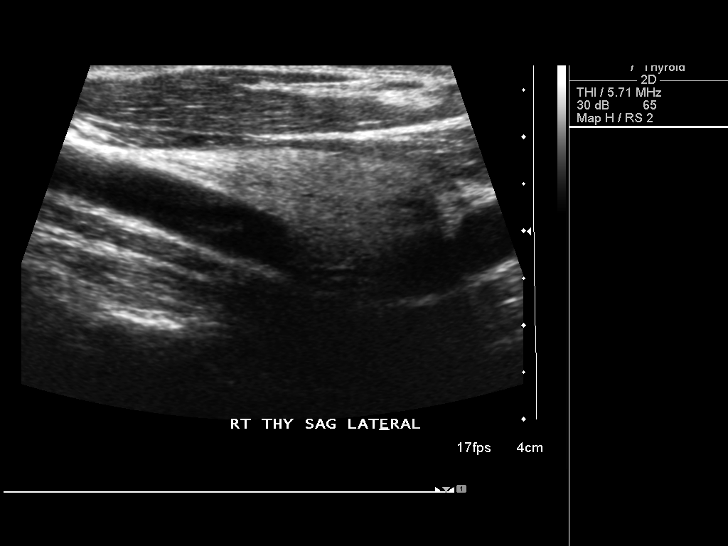
[im 10/25]
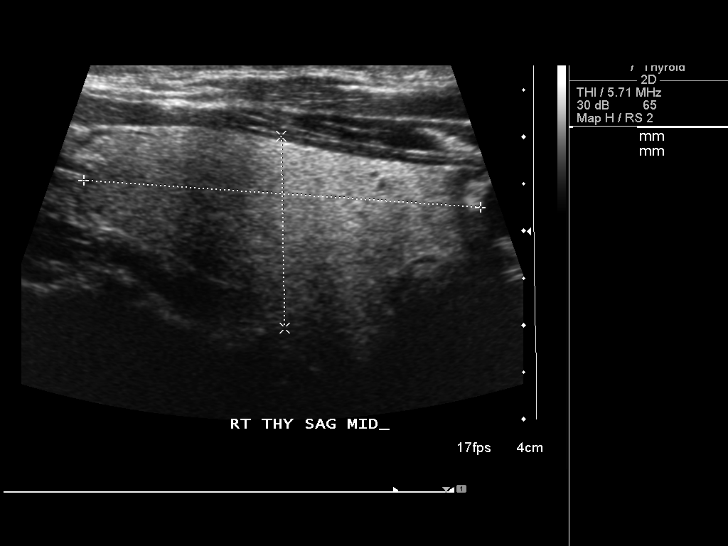
[im 12/25]
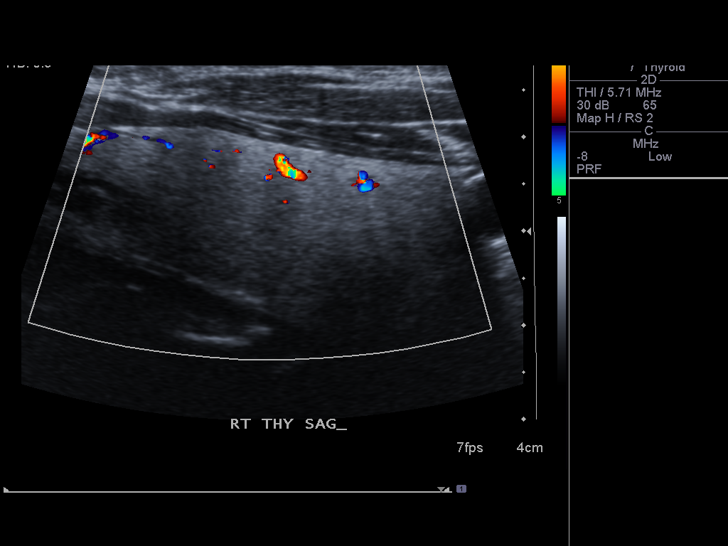
[im 14/25]
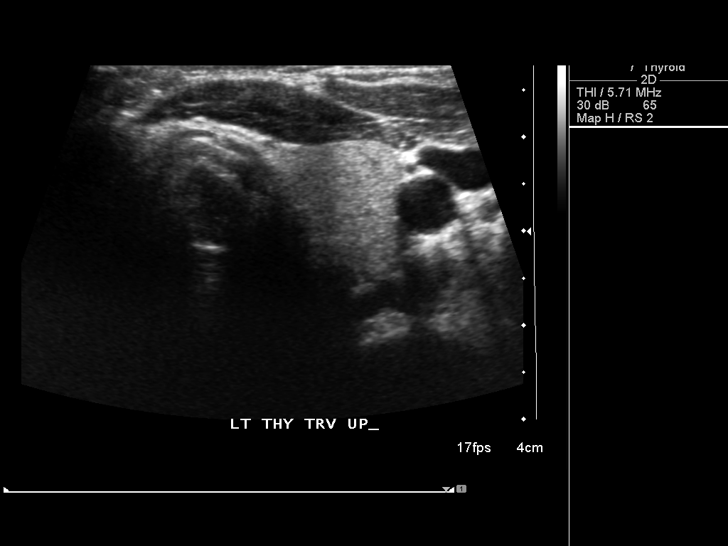
[im 16/25]
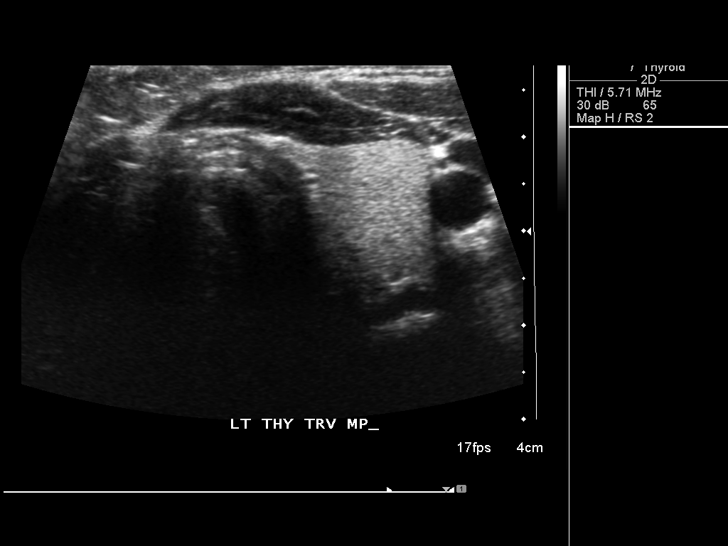
[im 17/25]
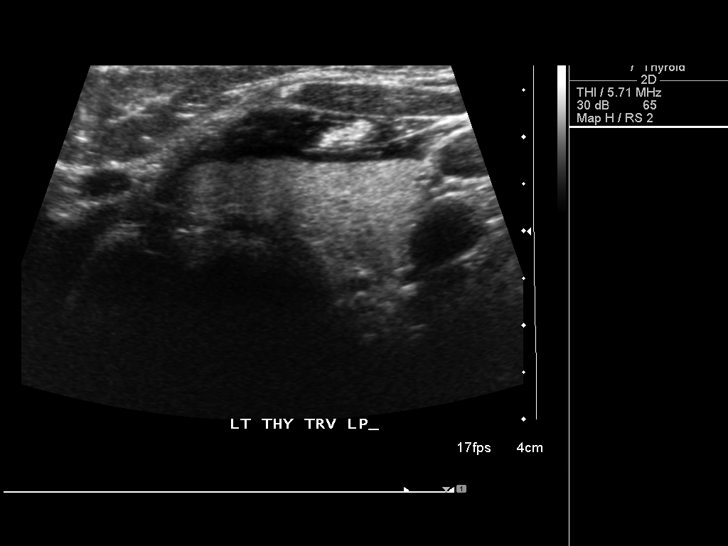
[im 19/25]
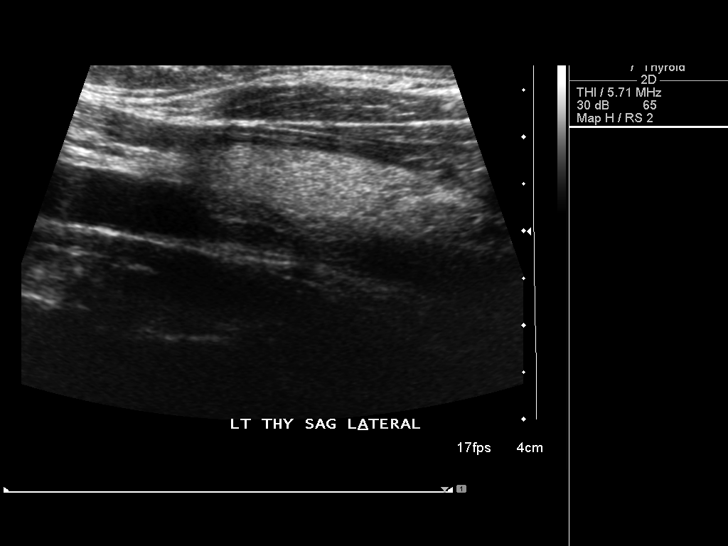
[im 21/25]
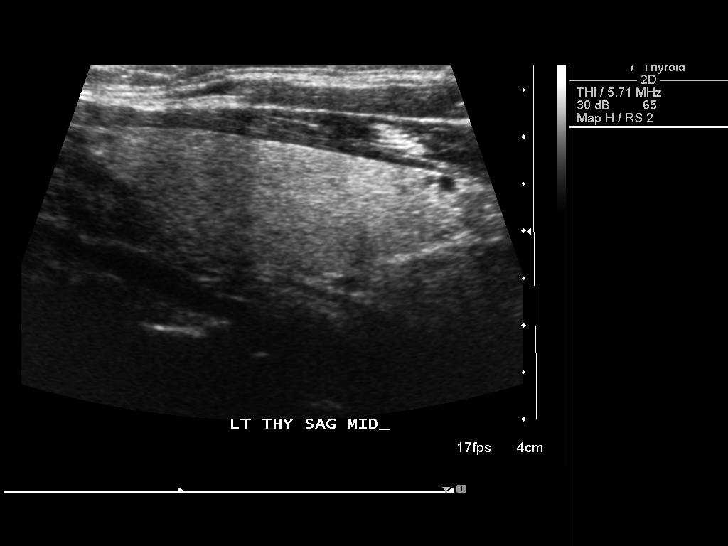
[im 23/25]
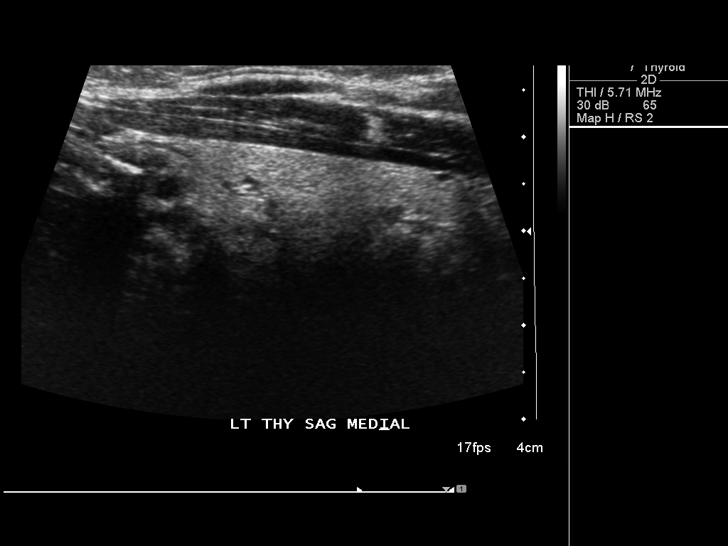
[im 25/25]
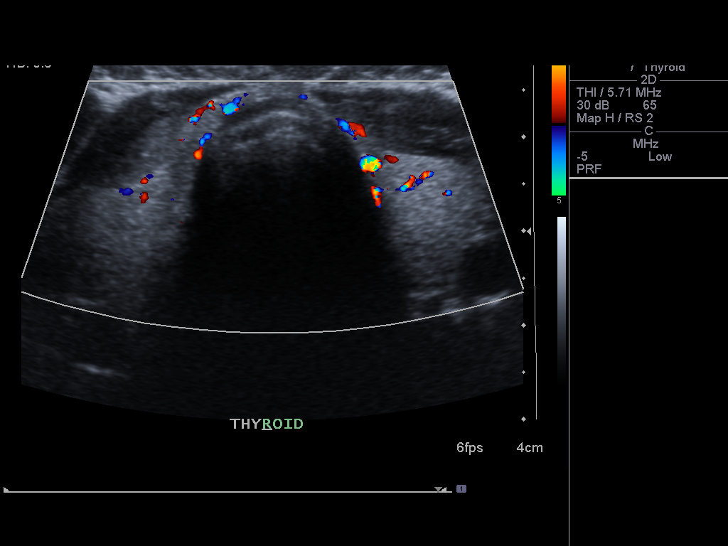

[14 of 25 positions shown; findings below may reference images not displayed]

FINDINGS: Right thyroid lobe measures 4.2 cm in length and 2.0 x
1.4 cm in transverse dimensions.  The left lobe measures 4.2 cm in
length and 1.4 x 1.2 cm transversely.  The thyroid isthmus measures
0.32 cm in thickness.  Diffusely homogeneous thyroid echotexture.
No focal lesions.  No nodules.
IMPRESSION: Normal thyroid ultrasound.

## 2010-04-02 ENCOUNTER — Ambulatory Visit: Payer: Self-pay | Admitting: Internal Medicine

## 2010-04-02 LAB — CONVERTED CEMR LAB
Calcium: 9.3 mg/dL (ref 8.4–10.5)
Potassium: 4.1 meq/L (ref 3.5–5.1)

## 2010-04-09 ENCOUNTER — Ambulatory Visit: Payer: Self-pay | Admitting: Internal Medicine

## 2010-08-26 ENCOUNTER — Ambulatory Visit: Payer: Self-pay | Admitting: Internal Medicine

## 2010-08-26 LAB — CONVERTED CEMR LAB: Sodium: 140 meq/L (ref 135–145)

## 2010-08-27 ENCOUNTER — Encounter: Payer: Self-pay | Admitting: Internal Medicine

## 2010-09-03 ENCOUNTER — Ambulatory Visit: Payer: Self-pay | Admitting: Licensed Clinical Social Worker

## 2010-09-07 ENCOUNTER — Telehealth: Payer: Self-pay | Admitting: Internal Medicine

## 2010-09-13 ENCOUNTER — Telehealth (INDEPENDENT_AMBULATORY_CARE_PROVIDER_SITE_OTHER): Payer: Self-pay

## 2010-09-24 ENCOUNTER — Ambulatory Visit: Payer: Self-pay | Admitting: Internal Medicine

## 2010-09-24 DIAGNOSIS — J309 Allergic rhinitis, unspecified: Secondary | ICD-10-CM | POA: Insufficient documentation

## 2010-10-11 ENCOUNTER — Ambulatory Visit: Payer: Self-pay | Admitting: Internal Medicine

## 2010-11-12 IMAGING — MG MM DIGITAL SCREENING BILAT
4 series · 4 of 4 positions shown · non-contrast
Comparison: none

DG SCREEN MAMMOGRAM BILATERAL
Bilateral CC and MLO view(s) were taken.

DIGITAL SCREENING MAMMOGRAM WITH CAD:
The breast tissue is almost entirely fatty.  No masses or malignant type calcifications are 
identified.  Compared with prior studies.
Images were processed with CAD.

[R CC]
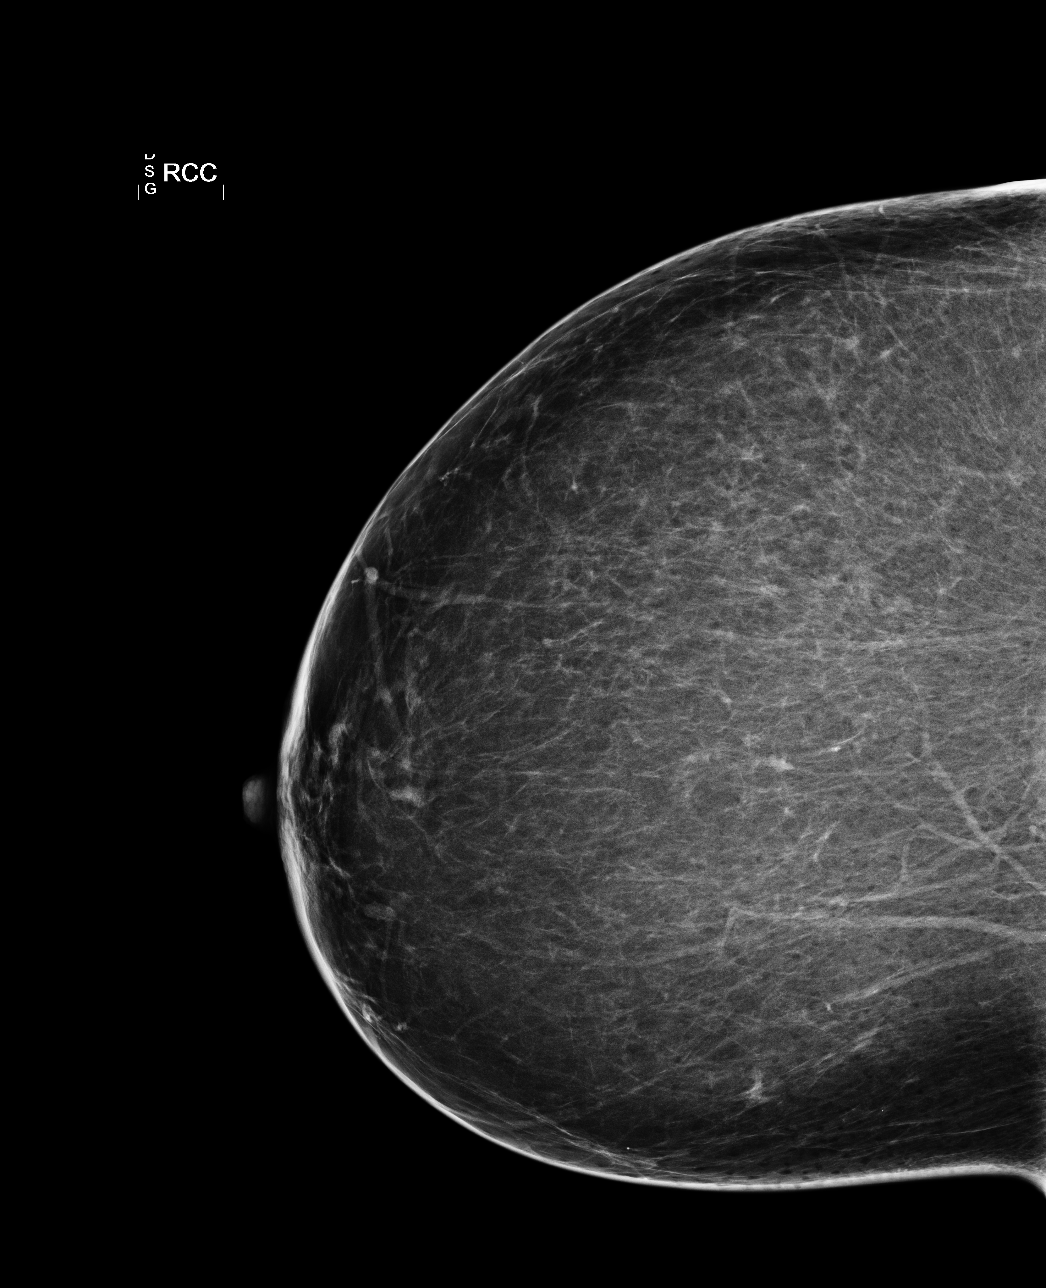

[R MLO]
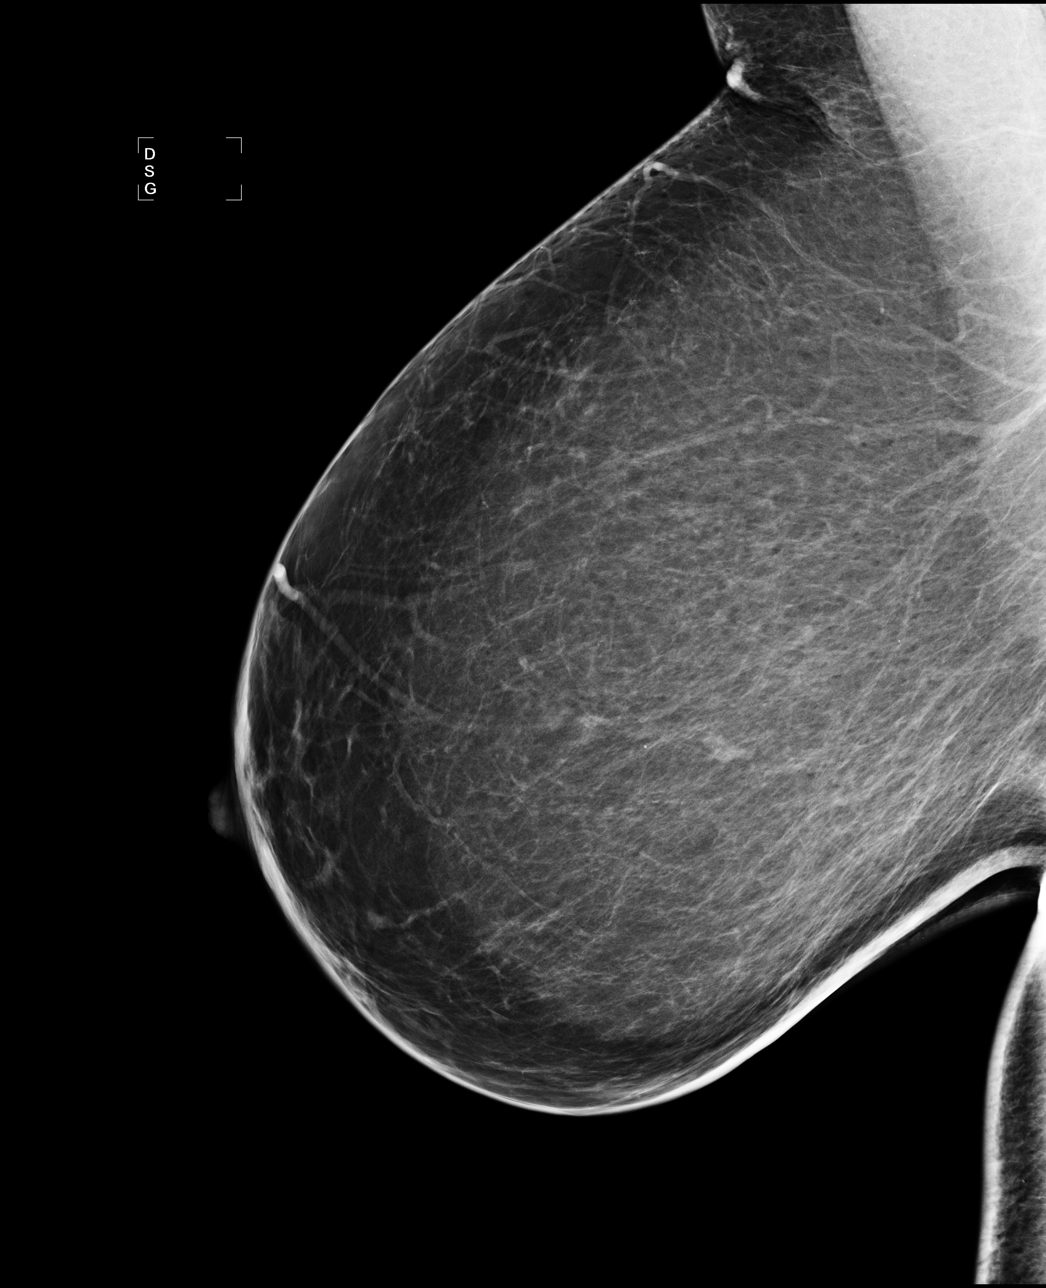

[L CC]
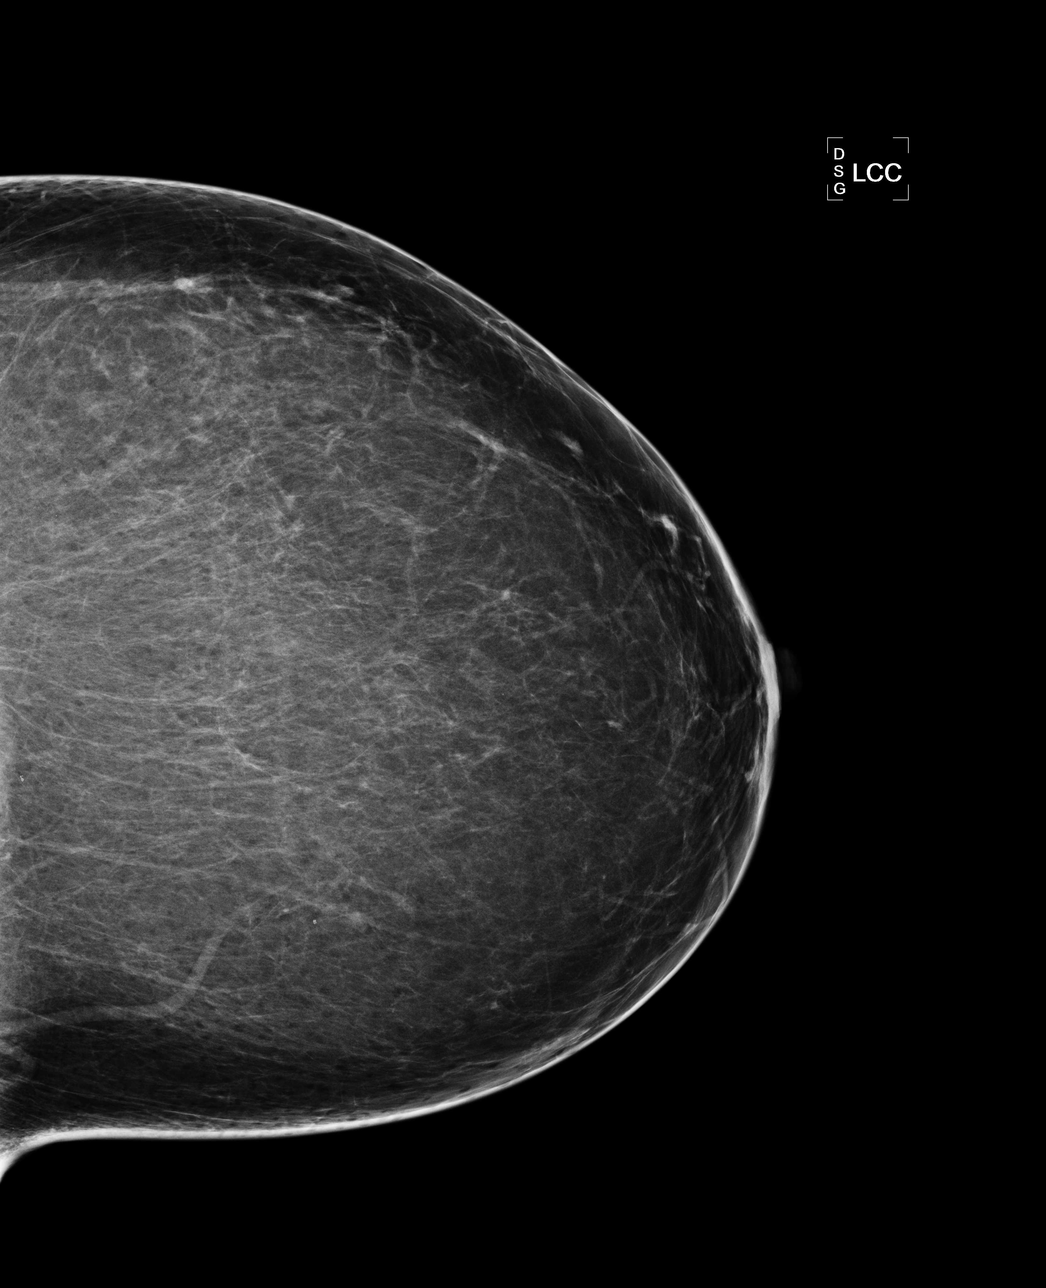

[L MLO]
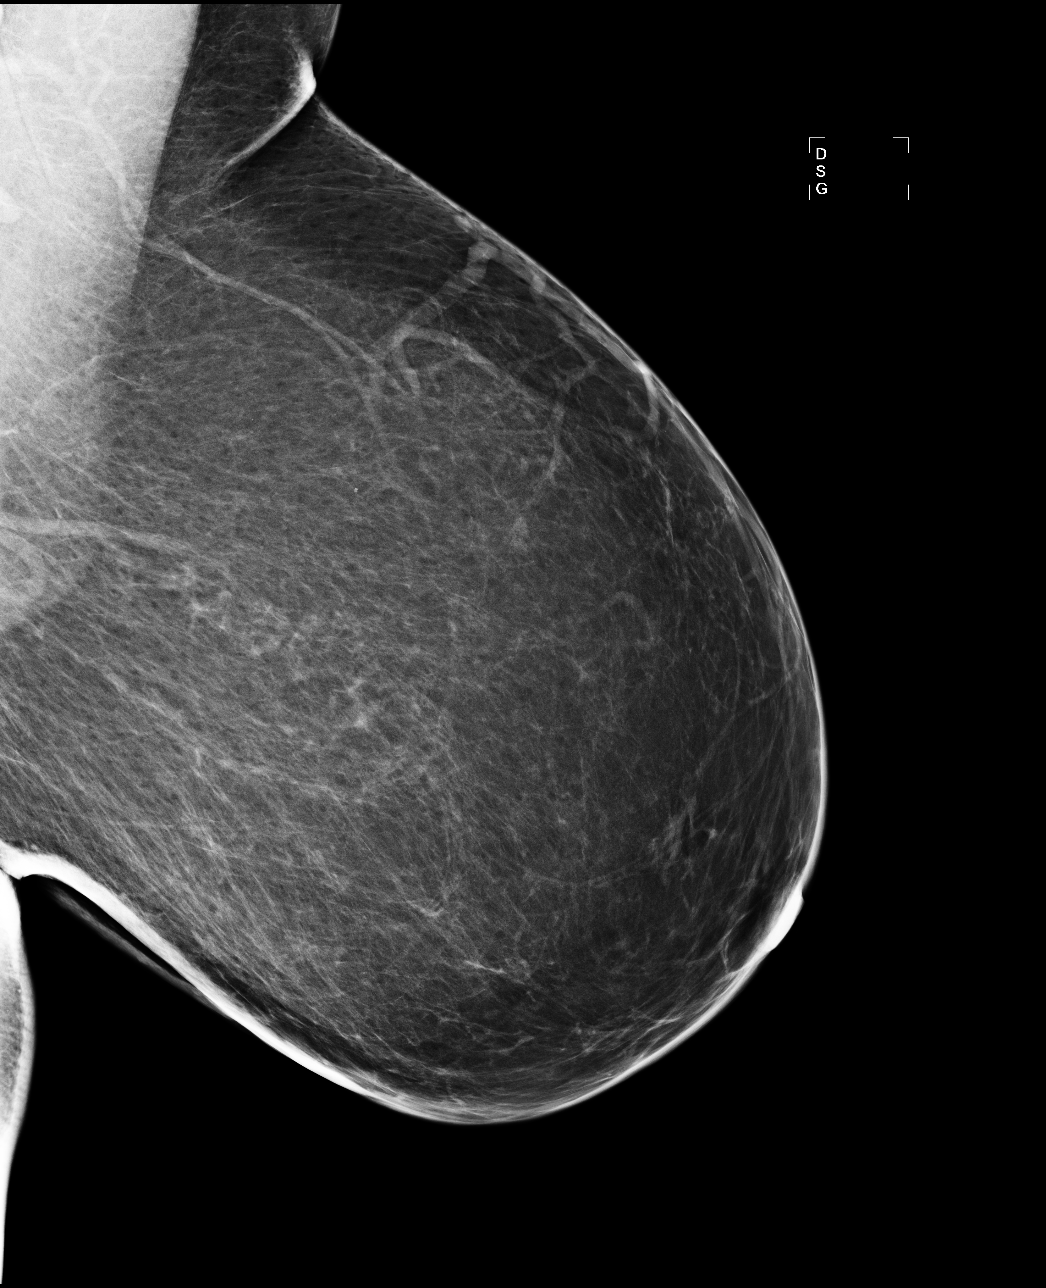

[4 of 4 positions shown; findings below may reference images not displayed]

IMPRESSION: No specific mammographic evidence of malignancy.  Next screening mammogram is recommended in one 
year.

A result letter of this screening mammogram will be mailed directly to the patient.

ASSESSMENT: Negative - BI-RADS 1

Screening mammogram in 1 year.
,

## 2010-12-28 NOTE — Letter (Signed)
   [place] at [facility] [address] [place], [place]  [address]  [place] Phone: [phone]      August 27, 2010   [facility] [address] [place], [place] [address]  RE:  LAB RESULTS  Dear  [name],  The following is an interpretation of your most recent lab tests.  Please take note of any instructions provided or changes to medications that have resulted from your lab work.  ELECTROLYTES:  Good - no changes needed  KIDNEY FUNCTION TESTS:  Good - no changes needed    DIABETIC STUDIES:  Good - no changes needed Blood Glucose: 85   HgbA1C: 5.8          Sincerely Yours,    Dr. Thomos Lemons  Appended Document:  mailed

## 2010-12-28 NOTE — Assessment & Plan Note (Signed)
Summary: upper respiratory issues lo grade fever/dt   Vital Signs:  Patient profile:   60 year old female Weight:      156 pounds BMI:     28.64 O2 Sat:      96 % on Room air Temp:     98.3 degrees F oral Pulse rate:   85 / minute Pulse rhythm:   regular Resp:     20 per minute BP sitting:   122 / 82  (right arm) Cuff size:   regular  Vitals Entered By: Glendell Docker CMA (August 26, 2010 3:44 PM)  O2 Flow:  Room air CC: URI Is Patient Diabetic? No Pain Assessment Patient in pain? no        Primary Care Provider:  Dondra Spry DO  CC:  URI.  History of Present Illness: 60 y/o AA female for f/u  sinus congestion x 1 week today woke up laryngitis no fever dry cough no shortness of breath sick contacts at work  several anxiety attacks since prev visit one every q weeks,  bp elevated,  short of breath no chest pain. feels like lungs are not getting enough air symptoms started before anniversary of son's death "dealing with it" taking ambien for sleep during the week tried melatonin - still poor sleep    Preventive Screening-Counseling & Management  Alcohol-Tobacco     Smoking Status: quit  Allergies: 1)  ! * Statins 2)  ! Pcn 3)  ! Codeine 4)  ! Metformin Hcl (Metformin Hcl)  Past History:  Past Medical History: Hypertension Hyperlipidemia  Elevated CPK and myalgia secondary to statins   (Lipitor, Crestor, Pravastatin) History of tobacco use       HX GERD  DM II borderline  Past Surgical History: Appendectomy Cholecystectomy Hysterectomy/bso   History of ovarian cyst        Social History: Smoking Status:  quit  Physical Exam  General:  alert, well-developed, and well-nourished.   Lungs:  normal respiratory effort.  slight coarse breath sounds Heart:  normal rate, regular rhythm, and no gallop.   Extremities:  No lower extremity edema  Psych:  normally interactive, good eye contact, not anxious appearing, and not depressed  appearing.     Impression & Recommendations:  Problem # 1:  ACUTE BRONCHITIS (ICD-466.0)  Her updated medication list for this problem includes:    Azithromycin 250 Mg Tabs (Azithromycin) .Marland Kitchen... 2 tabs on day one, then one by mouth once daily x 4 days  Take antibiotics and other medications as directed. Encouraged to push clear liquids, get enough rest, and take acetaminophen as needed. To be seen in 5-7 days if no improvement, sooner if worse.  Problem # 2:  DIABETES MELLITUS, TYPE II, BORDERLINE (ICD-790.29) Assessment: Unchanged  Her updated medication list for this problem includes:    Januvia 100 Mg Tabs (Sitagliptin phosphate) ..... One by mouth once daily  Orders: T-Basic Metabolic Panel [id number]) T- Hemoglobin A1C [phone])  Labs Reviewed: Creat: 0.8 (04/02/2010)     Problem # 3:  GRIEF REACTION (ICD-309.0) pt still having difficulty getting over son's death.  start sertraline.  refer for counseling Orders: Psychology Referral (Psychology)  Complete Medication List: 1)  Exforge 5-160 Mg Tabs (Amlodipine besylate-valsartan) .... Take 1 tablet by mouth once a day 2)  Fish Oil 1000 Mg Caps (Omega-3 fatty acids) .... Take 1 tablet by mouth once a day 3)  Black Cohosh 40 Mg Caps (Black cohosh) .... Take 1 capsule by mouth at  bedtime 4)  Januvia 100 Mg Tabs (Sitagliptin phosphate) .... One by mouth once daily 5)  Zolpidem Tartrate 10 Mg Tabs (Zolpidem tartrate) .... One by mouth at bedtime prn 6)  Multivitamins Tabs (Multiple vitamin) .... Take 1 tablet by mouth once a day 7)  Sertraline Hcl 25 Mg Tabs (Sertraline hcl) .... 1/2 by mouth once daily x 7 days, then one by mouth once daily 8)  Azithromycin 250 Mg Tabs (Azithromycin) .... 2 tabs on day one, then one by mouth once daily x 4 days  Patient Instructions: 1)  Please schedule a follow-up appointment in 1 month. Prescriptions: ZOLPIDEM TARTRATE 10 MG TABS (ZOLPIDEM TARTRATE) one by mouth at bedtime prn  #30 x  3   Entered and Authorized by:   D. Thomos Lemons DO   Signed by:   D. Thomos Lemons DO on 08/26/2010   Method used:   Print then Give to Patient   RxID:   [id number] AZITHROMYCIN 250 MG TABS (AZITHROMYCIN) 2 tabs on day one, then one by mouth once daily x 4 days  #6 x 0   Entered and Authorized by:   D. Thomos Lemons DO   Signed by:   D. Thomos Lemons DO on 08/26/2010   Method used:   Electronically to        [facility]* (retail)       [address]       [address] Shipping/mailing       [place], [place]  [address]       Ph: [phone]       Fax: [phone]   RxID:   [id number] SERTRALINE HCL 25 MG TABS (SERTRALINE HCL) 1/2 by mouth once daily x 7 days, then one by mouth once daily  #30 x 2   Entered and Authorized by:   D. Thomos Lemons DO   Signed by:   D. Thomos Lemons DO on 08/26/2010   Method used:   Electronically to        [facility]* (retail)       [address]       [address] Shipping/mailing       [place], [place]  [address]       Ph: [phone]       Fax: [phone]   RxID:   [id number]    Orders Added: 1)  T-Basic Metabolic Panel [phone] 2)  T- Hemoglobin A1C [[address]23375] 3)  Psychology Referral [Psychology] 4)  Est. Patient Level III [[address]]   Current Allergies (reviewed today): ! * STATINS ! PCN ! CODEINE ! METFORMIN HCL (METFORMIN HCL)

## 2010-12-28 NOTE — Progress Notes (Signed)
Summary: Thrush  Phone Note Call from Patient Call back at Work Phone [phone]   Caller: Patient Call For: D. Thomos Lemons DO Summary of Call: patient called and left voice message states she was seen by Dr Artist Pais and was given some antibiotics. She states she had Amy Esterwood look at her mouth. Del states she has devloped thrush on her tongue and would like to know if Dr Artist Pais would prescribe microstatin oral suspension for her. Initial call taken by: Glendell Docker CMA,  September 07, 2010 3:22 PM  Follow-up for Phone Call        call placed to patient at [phone], husband Greggory Stallion stated patient had not arrived home yet. He was asked to inform patient that rx has been sent to [facility] pharmacy. He has verblaized understanding and agrees as instructed Follow-up by: Glendell Docker CMA,  September 07, 2010 5:23 PM    New/Updated Medications: NYSTATIN 100000 UNIT/ML SUSP (NYSTATIN) 5 ml qid x 2 - 4 weeks Prescriptions: NYSTATIN 100000 UNIT/ML SUSP (NYSTATIN) 5 ml qid x 2 - 4 weeks  #140 ml x 1   Entered and Authorized by:   D. Thomos Lemons DO   Signed by:   D. Thomos Lemons DO on 09/07/2010   Method used:   Electronically to        [facility]* (retail)       [address]       [address] [place] Shipping[address]mailing       [place], [place]  [address]       Ph: [phone]       Fax: [phone]   RxID:   [id number]

## 2010-12-28 NOTE — Assessment & Plan Note (Signed)
Summary: 6 month follow up/mhf   Vital Signs:  Patient profile:   60 year old female Height:      62 inches Weight:      154.50 pounds BMI:     28.36 O2 Sat:      97 % on Room air Temp:     98.2 degrees F oral Pulse rate:   78 / minute Pulse rhythm:   regular Resp:     16 per minute BP sitting:   114 / 80  (right arm) Cuff size:   regular  Vitals Entered By: Glendell Docker CMA (Apr 09, 2010 9:24 AM)  O2 Flow:  Room air CC: Rm 2- 6 Month Follow up disease management Comments Ambien Refill   Primary Care Provider:  Dondra Spry DO  CC:  Rm 2- 6 Month Follow up disease management.  History of Present Illness:  Hypertension Follow-Up      This is a 60 year old woman who presents for Hypertension follow-up.  The patient denies lightheadedness, headaches, and edema.  The patient denies the following associated symptoms: chest pain.  Compliance with medications (by patient report) has been near 100%.  The patient reports that dietary compliance has been fair.    son murdered then father passed away in Dec 03, 2023 denies depressive symptoms but has trouble sleeping   Allergies: 1)  ! * Statins 2)  ! Pcn 3)  ! Codeine 4)  ! 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  she has not been able to walk over last several months. knee is feeling better.  she received cortisone injection   Physical Exam  General:  alert, well-developed, and well-nourished.   Lungs:  normal respiratory effort, no dullness, no crackles, and no wheezes.   Heart:  normal rate, regular rhythm, and no gallop.   Extremities:  No lower extremity edema  Neurologic:  cranial nerves II-XII intact and gait normal.     Impression & Recommendations:  Problem # 1:  HYPERTENSION (ICD-401.9) well controlled. Maintain current medication regimen.  Her updated medication list for this problem includes:    Exforge 5-160 Mg Tabs (Amlodipine besylate-valsartan) .Marland Kitchen... Take 1 tablet by mouth once a day  BP today: 114/80 Prior BP: 110/54 (10/16/2009)  Labs Reviewed: K+: 4.1 (04/02/2010) Creat: : 0.8 (04/02/2010)   Chol: 247 (07/10/2009)   HDL: 42.50 (07/10/2009)   LDL: DEL (05/15/2007)   TG: 101.0 (07/10/2009)  Problem # 2:  GRIEF REACTION (ICD-309.0) she denies depressive symptoms but is having chronic insomnia.  we discussed non pharmocologic ways to encourage sleep.  use ambien sparingly.  try to avoid long term  use  Complete Medication List: 1)  Exforge 5-160 Mg Tabs (Amlodipine besylate-valsartan) .... Take 1 tablet by mouth once a day 2)  Fish Oil 1000 Mg Caps (Omega-3 fatty acids) .... Take 1 tablet by mouth once a day 3)  Black Cohosh 40 Mg Caps (Black cohosh) .... Take 1 capsule by mouth at bedtime 4)  Januvia 100 Mg Tabs (Sitagliptin phosphate) .... One by mouth once daily 5)  Zolpidem Tartrate 10 Mg Tabs (Zolpidem tartrate) .... One by mouth at bedtime prn 6)  Multivitamins Tabs (Multiple vitamin) .... Take 1 tablet by mouth once a day  Patient Instructions: 1)  You can stop Januvia is you are able to  exercise daily 2)  Please schedule a follow-up appointment in 6 months. 3)  BMP prior to visit, ICD-9: 401.9 4)  HbgA1C prior to  visit, ICD-9: 790.29 5)  Please return for lab work one (1) week before your next appointment.  Prescriptions: EXFORGE 5-160 MG  TABS (AMLODIPINE BESYLATE-VALSARTAN) Take 1 tablet by mouth once a day  #90 x 3   Entered and Authorized by:   D. Thomos Lemons DO   Signed by:   D. Thomos Lemons DO on 04/09/2010   Method used:   Electronically to        [facility]* (retail)       [address]       [address] Shipping/mailing       [place], [place]  [address]       Ph: [phone]       Fax: [phone]   RxID:   [id number] EXFORGE 5-160 MG  TABS (AMLODIPINE BESYLATE-VALSARTAN) Take 1 tablet by mouth once a day  #90 x 3   Entered and Authorized by:   D. Thomos Lemons DO   Signed by:   D. Thomos Lemons DO on 04/09/2010   Method used:   Print then Give to Patient   RxID:   [id number] ZOLPIDEM TARTRATE 10 MG TABS (ZOLPIDEM TARTRATE) one by mouth at bedtime prn  #30 x 3   Entered and Authorized by:   D. Thomos Lemons DO   Signed by:   D. Thomos Lemons DO on 04/09/2010   Method used:   Print then Give to Patient   RxID:   [id number]   Current Allergies (reviewed today): ! * STATINS ! PCN ! CODEINE ! METFORMIN HCL (METFORMIN HCL)   Preventive Care Screening  Pap Smear:    Date:  09/24/2009    Results:  normal   Mammogram:    Date:  08/10/2009    Results:  normal

## 2010-12-28 NOTE — Assessment & Plan Note (Signed)
Summary: 1 month fu/dt   Vital Signs:  Patient profile:   59 year old female Height:      62 inches Weight:      156 pounds BMI:     28.64 O2 Sat:      96 % on Room air Temp:     98.0 degrees F oral Pulse rate:   78 / minute BP sitting:   122 / 84  (left arm) Cuff size:   regular  Vitals Entered By: Payton Spark CMA (September 24, 2010 9:11 AM)  O2 Flow:  Room air CC: 1 mo f/u    Primary Care Provider:  Dondra Spry DO  CC:  1 mo f/u .  History of Present Illness: 84 y/o AA female for f/u grief rxn - met with susan bond feeling better.  did not feel SSRI needed sleep also improved.  only used ambien 1 x this month  still has intermittent dry cough worse with laying some nasal congestion  htn - stable  Current Medications (verified): 1)  Exforge 5-160 Mg  Tabs (Amlodipine Besylate-Valsartan) .... Take 1 Tablet By Mouth Once A Day 2)  Fish Oil 1000 Mg  Caps (Omega-3 Fatty Acids) .... Take 1 Tablet By Mouth Once A Day 3)  Black Cohosh 40 Mg Caps (Black Cohosh) .... Take 1 Capsule By Mouth At Bedtime 4)  Januvia 100 Mg Tabs (Sitagliptin Phosphate) .... One By Mouth Once Daily 5)  Zolpidem Tartrate 10 Mg Tabs (Zolpidem Tartrate) .... One By Mouth At Bedtime Prn 6)  Multivitamins  Tabs (Multiple Vitamin) .... Take 1 Tablet By Mouth Once A Day 7)  Sertraline Hcl 25 Mg Tabs (Sertraline Hcl) .... 1/2 By Mouth Once Daily X 7 Days, Then One By Mouth Once Daily  Allergies (verified): 1)  ! * Statins 2)  ! Pcn 3)  ! Codeine 4)  ! Metformin Hcl (Metformin Hcl)  Past History:  Past Medical History: Hypertension Hyperlipidemia  Elevated CPK and myalgia secondary to statins   (Lipitor, Crestor, Pravastatin) History of tobacco use       HX GERD  DM II borderline   GYN - Dr. 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   Eyes:  pupils equal, pupils round, and pupils reactive to light.   Nose:  nasal dischargemucosal pallor and mucosal edema.  L > R Mouth:  pharynx pink and moist.   Lungs:  normal respiratory effort, normal breath sounds, no crackles, and no wheezes.   Heart:  normal rate, regular rhythm, and no gallop.     Impression & Recommendations:  Problem # 1:  GRIEF REACTION (ICD-309.0) Assessment Improved pt met with Judithe Modest. feeling better with counseling SSRI not needed using ambien less often  Problem # 2:  ALLERGIC RHINITIS (ICD-477.9) pt notes intermittent dry cough.  worse with laying I suspect cough from post nasal gtt. chest is clear use otc zyrtec and generic flonase if cough does not improve, we discussed obtainging CXR  Problem # 3:  HYPERTENSION (ICD-401.9) Assessment: Unchanged  Her updated medication list for this problem includes:    Exforge 5-160 Mg Tabs (Amlodipine besylate-valsartan) .Marland Kitchen... Take 1 tablet by mouth once a day  BP today: 122/84 Prior BP: 122/82 (08/26/2010)  Labs Reviewed: K+: 4.3 (08/26/2010) Creat: : 0.85 (08/26/2010)   Chol: 247 (07/10/2009)   HDL: 42.50 (07/10/2009)   LDL: DEL (05/15/2007)   TG: 101.0 (07/10/2009)  Complete Medication List: 1)  Exforge 5-160 Mg Tabs (Amlodipine besylate-valsartan) .... Take 1 tablet by mouth once a day 2)  Fish Oil 1000 Mg Caps (Omega-3 fatty acids) .... Take 1 tablet by mouth once a day 3)  Black Cohosh 40 Mg Caps (Black cohosh) .... Take 1 capsule by mouth at bedtime 4)  Januvia 100 Mg Tabs (Sitagliptin phosphate) .... One by mouth once daily 5)  Zolpidem Tartrate 10 Mg Tabs (Zolpidem tartrate) .... One by mouth at bedtime  prn 6)  Multivitamins Tabs (Multiple vitamin) .... Take 1 tablet by mouth once a day  Patient Instructions: 1)  Please schedule a follow-up appointment in 4 months. 2)  BMP prior to visit, ICD-9:  401.9 3)  Hepatic Panel prior to visit, ICD-9:  272.4 4)  HbgA1C prior to visit, ICD-9:  790.29 5)  Please return for lab work one (1) week before your next appointment.  Prescriptions: JANUVIA 100 MG TABS (SITAGLIPTIN PHOSPHATE) one by mouth once daily  #90 Tablet x 1   Entered and Authorized by:   D. Thomos Lemons DO   Signed by:   D. Thomos Lemons DO on 09/24/2010   Method used:   Electronically to        [facility]* (retail)       [address]       [address] Shipping/mailing       [place], [place]  [address]       Ph: [phone]       Fax: [phone]   RxID:   [id number]    Orders Added: 1)  Est. Patient Level III [84132]  Appended Document: 1 month fu/dt

## 2010-12-28 NOTE — Progress Notes (Signed)
Summary: Thrush  Medications Added DIFLUCAN 100 MG TABS (FLUCONAZOLE) 1 by mouth once daily       Phone Note Other Incoming Call back at [facility] Phone [phone]   Caller: [place] Summary of Call: Per Mike Gip PA [facility], patient with oral thrush.  Needs rx for Diflucan 100 mg by mouth once daily X10 Initial call taken by: Darcey Nora RN, CGRN,  September 13, 2010 12:14 PM    New/Updated Medications: DIFLUCAN 100 MG TABS (FLUCONAZOLE) 1 by mouth once daily Prescriptions: DIFLUCAN 100 MG TABS (FLUCONAZOLE) 1 by mouth once daily  #10 x 0   Entered by:   Darcey Nora RN, CGRN   Authorized by:   Sammuel Cooper PA-c   Signed by:   Darcey Nora RN, CGRN on 09/13/2010   Method used:   Electronically to        [facility]* (retail)       [address]       [address] Shipping/mailing       [place], [place]  [address]       Ph: [phone]       Fax: [phone]   RxID:   [id number]

## 2011-01-14 ENCOUNTER — Encounter (INDEPENDENT_AMBULATORY_CARE_PROVIDER_SITE_OTHER): Payer: Self-pay | Admitting: *Deleted

## 2011-01-14 ENCOUNTER — Other Ambulatory Visit: Payer: Self-pay

## 2011-01-14 ENCOUNTER — Other Ambulatory Visit: Payer: Self-pay | Admitting: Internal Medicine

## 2011-01-14 DIAGNOSIS — I10 Essential (primary) hypertension: Secondary | ICD-10-CM

## 2011-01-14 DIAGNOSIS — R7309 Other abnormal glucose: Secondary | ICD-10-CM

## 2011-01-14 DIAGNOSIS — E785 Hyperlipidemia, unspecified: Secondary | ICD-10-CM

## 2011-01-14 LAB — HEPATIC FUNCTION PANEL
AST: 27 U/L (ref 0–37)
Albumin: 3.6 g/dL (ref 3.5–5.2)
Alkaline Phosphatase: 75 U/L (ref 39–117)
Total Bilirubin: 0.8 mg/dL (ref 0.3–1.2)
Total Protein: 6.6 g/dL (ref 6.0–8.3)

## 2011-01-14 LAB — BASIC METABOLIC PANEL
CO2: 29 mEq/L (ref 19–32)
Calcium: 9.1 mg/dL (ref 8.4–10.5)
Chloride: 107 mEq/L (ref 96–112)
Creatinine, Ser: 0.7 mg/dL (ref 0.4–1.2)
GFR: 108.35 mL/min (ref >60.00)
Glucose, Bld: 79 mg/dL (ref 70–99)
Sodium: 141 mEq/L (ref 135–145)

## 2011-01-21 ENCOUNTER — Ambulatory Visit: Payer: Self-pay | Admitting: Internal Medicine

## 2011-01-21 ENCOUNTER — Ambulatory Visit (INDEPENDENT_AMBULATORY_CARE_PROVIDER_SITE_OTHER): Payer: Commercial Managed Care - PPO | Admitting: Internal Medicine

## 2011-01-21 ENCOUNTER — Encounter: Payer: Self-pay | Admitting: Internal Medicine

## 2011-01-21 DIAGNOSIS — R7301 Impaired fasting glucose: Secondary | ICD-10-CM | POA: Insufficient documentation

## 2011-01-21 DIAGNOSIS — I10 Essential (primary) hypertension: Secondary | ICD-10-CM

## 2011-01-21 DIAGNOSIS — F4321 Adjustment disorder with depressed mood: Secondary | ICD-10-CM

## 2011-01-21 DIAGNOSIS — E119 Type 2 diabetes mellitus without complications: Secondary | ICD-10-CM

## 2011-01-21 DIAGNOSIS — E785 Hyperlipidemia, unspecified: Secondary | ICD-10-CM

## 2011-01-25 NOTE — Assessment & Plan Note (Signed)
Summary: NEW/ UMR /SWITCH FROM DR Artist Pais Natale Milch #   Vital Signs:  Patient profile:   60 year old female Weight:      157.12 pounds (71.42 kg) O2 Sat:      95 % on Room air Temp:     98.8 degrees F (37.11 degrees C) oral Pulse rate:   84 / minute BP sitting:   122 / 72  (left arm) Cuff size:   regular  Vitals Entered By: Orlan Leavens RMA (January 21, 2011 9:49 AM)  O2 Flow:  Room air CC: New patient- Transferring from Dr. Artist Pais Is Patient Diabetic? Yes Did you bring your meter with you today? No Pain Assessment Patient in pain? no        Primary Care Provider:  Dondra Spry DO  CC:  New patient- Transferring from Dr. Artist Pais.  History of Present Illness: pt known to our practice and division -  prev followed with dr. Artist Pais, now closer to home desired (works at [employer] office)  reviewed chronic med issues: hx grief rxn  summer 2010 due to unexpected death of son met with [facility] feeling better.  did not feel daily SSRI needed but pt uses "as needed" sleep also improved.  only used ambien 1 x this month  htn - reports compliance with ongoing medical treatment and no changes in medication dose or frequency. denies adverse side effects related to current therapy.   dm2 - hx intol to metformin (gasrtic upset)- reports compliance with ongoing medical treatment and no changes in medication dose or frequency. denies adverse side effects related to current therapy. does not check cbgs regualry  dyslipidemia - intol of statin tx in past - uses fishoil and otc tx - working on diet and exercise  Preventive Screening-Counseling & Management  Alcohol-Tobacco     Alcohol drinks/day: 0     Alcohol Counseling: not indicated; patient does not drink     Smoking Status: quit     Year Started: 1982     Year Quit: 2005     Tobacco Counseling: to remain off tobacco products  Caffeine-Diet-Exercise     Caffeine use/day: 1-2  beverages daily     Caffeine Counseling: decrease use of  caffeine     Does Patient Exercise: yes     Type of exercise: walking     Times/week: 5  Safety-Violence-Falls     Firearm Counseling: not applicable  Clinical Review Panels:  Prevention   Last Mammogram:  normal (08/10/2009)   Last Pap Smear:  normal (09/24/2009)   Last Colonoscopy:  1) Mild diverticulosis in the sigmoid colon 2) 4 mm diminutive polyp in the mid transverse colon, removed TUBULAR ADENOMA 3) Otherwise normal examination, excellent prep (07/20/2009)  Immunizations   Last Tetanus Booster:  given (01/17/2005)   Last Flu Vaccine:  Historical (09/28/2010)   Last Pneumovax:  given (01/16/2006)  Lipid Management   Cholesterol:  247 (07/10/2009)   LDL (bad choesterol):  DEL (05/15/2007)   HDL (good cholesterol):  42.50 (07/10/2009)  Diabetes Management   HgBA1C:  5.8 (01/14/2011)   Creatinine:  0.7 (01/14/2011)   Last Flu Vaccine:  Historical (09/28/2010)   Last Pneumovax:  given (01/16/2006)  CBC   WBC:  11.3 (06/23/2009)   RBC:  4.05 (06/23/2009)   Hgb:  13.6 (06/23/2009)   Hct:  38.8 (06/23/2009)   Platelets:  222.0 (06/23/2009)   MCV  95.8 (06/23/2009)   MCHC  35.0 (06/23/2009)   RDW  12.5 (06/23/2009)  PMN:  60.2 (06/23/2009)   Lymphs:  23.8 (06/23/2009)   Monos:  3.9 (06/23/2009)   Eosinophils:  11.7 (06/23/2009)   Basophil:  0.4 (06/23/2009)  Complete Metabolic Panel   Glucose:  79 (01/14/2011)   Sodium:  141 (01/14/2011)   Potassium:  3.9 (01/14/2011)   Chloride:  107 (01/14/2011)   CO2:  29 (01/14/2011)   BUN:  15 (01/14/2011)   Creatinine:  0.7 (01/14/2011)   Albumin:  3.6 (01/14/2011)   Total Protein:  6.6 (01/14/2011)   Calcium:  9.1 (01/14/2011)   Total Bili:  0.8 (01/14/2011)   Alk Phos:  75 (01/14/2011)   SGPT (ALT):  26 (01/14/2011)   SGOT (AST):  27 (01/14/2011)   Current Medications (verified): 1)  Exforge 5-160 Mg  Tabs (Amlodipine Besylate-Valsartan) .... Take 1 Tablet By Mouth Once A Day 2)  Fish Oil 1000 Mg  Caps  (Omega-3 Fatty Acids) .... Take 1 Tablet By Mouth Once A Day 3)  Black Cohosh 40 Mg Caps (Black Cohosh) .... Take 1 Capsule By Mouth At Bedtime 4)  Januvia 100 Mg Tabs (Sitagliptin Phosphate) .... One By Mouth Once Daily 5)  Zolpidem Tartrate 10 Mg Tabs (Zolpidem Tartrate) .... One By Mouth At Bedtime Prn 6)  Multivitamins  Tabs (Multiple Vitamin) .... Take 1 Tablet By Mouth Once A Day 7)  Sertraline Hcl 25 Mg Tabs (Sertraline Hcl) .... Take 1 As Needed 8)  Biotin 1000 Mcg Tabs (Biotin) .... Take 1 By Mouth Once Daily 9)  Aleve 220 Mg Tabs (Naproxen Sodium) .... Take 2 By Mouth Once Daily  Allergies (verified): 1)  ! * Statins 2)  ! Pcn 3)  ! Codeine 4)  ! 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 Perlie Gold [facility]o - [facility]  Past Surgical History: Appendectomy Cholecystectomy Hysterectomy/bso    History of ovarian cyst        Family History: Mother deceased age 58 - CHF, DM Type II  Father expired age 67 - history of mini strokes and Alzheimers  Family History of Stomach Cancer:grandfather maternal No FH of Colon Cancer:      Social History: Occupation:  CMA for [employer]  Married, lives with spouse son murdered age 38y summer 2010 - one grown child remains Former Smoker quit 2005 (light smoker for 20 years) Alcohol use-no    Daily Caffeine Use  1 cup per day (Tea)     Review of Systems  The patient denies weight loss, chest pain, syncope, headaches, abdominal pain, muscle weakness, and suspicious skin lesions.    Physical Exam  General:  overweight-appearing.  alert, well-developed, well-nourished, and cooperative to examination.    Lungs:  normal respiratory effort, no intercostal retractions or use of accessory  muscles; normal breath sounds bilaterally - no crackles and no wheezes.    Heart:  normal rate, regular rhythm, no murmur, and no rub. BLE without edema.  Abdomen:  soft, non-tender, and normal bowel sounds.   Psych:  normally interactive, good eye contact, not anxious appearing, and not depressed appearing.     Impression & Recommendations:  Problem # 1:  DIABETES MELLITUS, TYPE II (ICD-250.00)  mild dz - a1c prev 6.3 at time of dx -  intol metformin due to GI upset now well controlled - tol januvia well discussed option of dc med but concern that pt would not be as well controlled advised weight loss and improved exercise/diet efforts (with better contol of arthritis -ongoing with voytek) reviewed recent labs done prior to OV  Her updated medication list for this problem includes:    Exforge 5-160 Mg Tabs (Amlodipine besylate-valsartan) .Marland Kitchen... Take 1 tablet by mouth once a day    Januvia 100 Mg Tabs (Sitagliptin phosphate) ..... One by mouth once daily  Labs Reviewed: Creat: 0.7 (01/14/2011)    Reviewed HgBA1c results: 5.8 (01/14/2011)  5.8 (08/26/2010)  Problem # 2:  HYPERTENSION (ICD-401.9)  Her updated medication list for this problem includes:    Exforge 5-160 Mg Tabs (Amlodipine besylate-valsartan) .Marland Kitchen... 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   continue to work on diet and exercise control of same  Labs Reviewed: SGOT: 27 (01/14/2011)   SGPT: 26 (01/14/2011)   HDL:42.50 (07/10/2009), 45.40 (03/05/2009)  LDL:DEL (05/15/2007), DEL (02/05/2007)  Chol:247 (07/10/2009), 224 (03/05/2009)  Trig:101.0 (07/10/2009), 108.0  (03/05/2009)  Problem # 4:  GRIEF REACTION (ICD-309.0) precipitated by unexpected death of son summer 2010 pt met with Judithe Modest feels better with counseling SSRI not needed using ambien less often  Time spent with patient 35 minutes, more than 50% of this time was spent counseling patient on diabetes and other medications, need for weight control and review of other medical hx and providers  Complete Medication List: 1)  Exforge 5-160 Mg Tabs (Amlodipine besylate-valsartan) .... Take 1 tablet by mouth once a day 2)  Fish Oil 1000 Mg Caps (Omega-3 fatty acids) .... Take 1 tablet by mouth once a day 3)  Black Cohosh 40 Mg Caps (Black cohosh) .... Take 1 capsule by mouth at bedtime 4)  Januvia 100 Mg Tabs (Sitagliptin phosphate) .... One by mouth once daily 5)  Zolpidem Tartrate 10 Mg Tabs (Zolpidem tartrate) .... One by mouth at bedtime prn 6)  Multivitamins Tabs (Multiple vitamin) .... Take 1 tablet by mouth once a day 7)  Sertraline Hcl 25 Mg Tabs (Sertraline hcl) .... Take 1 as needed 8)  Biotin 1000 Mcg Tabs (Biotin) .... Take 1 by mouth once daily 9)  Aleve 220 Mg Tabs (Naproxen sodium) .... Take 2 by mouth once daily  Patient Instructions: 1)  it was good to see you today. 2)  medications and history reviewed and labs today reviewed - no medication changes 3)  call if and when refiils needed 4)  work on weight loss and exercise so we can consider taking you off all diabetes medications! 5)  Please schedule a follow-up appointment in 6 months to monitor DM, blood pressure and other issues; call sooner if problems.    Orders Added: 1)  Est. Patient Level IV [81191]   Immunization History:  Influenza Immunization History:    Influenza:  historical (09/28/2010)   Immunization History:  Influenza Immunization History:    Influenza:  Historical (09/28/2010)

## 2011-01-26 ENCOUNTER — Other Ambulatory Visit (HOSPITAL_COMMUNITY): Payer: Self-pay | Admitting: Orthopedic Surgery

## 2011-01-26 DIAGNOSIS — M79601 Pain in right arm: Secondary | ICD-10-CM

## 2011-01-26 DIAGNOSIS — M542 Cervicalgia: Secondary | ICD-10-CM

## 2011-02-01 ENCOUNTER — Ambulatory Visit: Payer: Self-pay | Admitting: Internal Medicine

## 2011-02-02 ENCOUNTER — Other Ambulatory Visit (HOSPITAL_COMMUNITY): Payer: Commercial Managed Care - PPO

## 2011-02-07 ENCOUNTER — Other Ambulatory Visit (HOSPITAL_COMMUNITY): Payer: Commercial Managed Care - PPO

## 2011-02-07 ENCOUNTER — Ambulatory Visit (HOSPITAL_COMMUNITY)
Admission: RE | Admit: 2011-02-07 | Discharge: 2011-02-07 | Disposition: A | Discharge status: Home / Self Care | Payer: Commercial Managed Care - PPO | Source: Ambulatory Visit | Attending: Orthopedic Surgery | Admitting: Orthopedic Surgery

## 2011-02-07 DIAGNOSIS — M542 Cervicalgia: Secondary | ICD-10-CM | POA: Insufficient documentation

## 2011-02-07 DIAGNOSIS — M47812 Spondylosis without myelopathy or radiculopathy, cervical region: Secondary | ICD-10-CM | POA: Insufficient documentation

## 2011-02-07 DIAGNOSIS — R209 Unspecified disturbances of skin sensation: Secondary | ICD-10-CM | POA: Insufficient documentation

## 2011-02-07 DIAGNOSIS — M79609 Pain in unspecified limb: Secondary | ICD-10-CM | POA: Insufficient documentation

## 2011-02-07 DIAGNOSIS — M5030 Other cervical disc degeneration, unspecified cervical region: Secondary | ICD-10-CM | POA: Insufficient documentation

## 2011-02-07 DIAGNOSIS — M79601 Pain in right arm: Secondary | ICD-10-CM

## 2011-02-07 DIAGNOSIS — M5124 Other intervertebral disc displacement, thoracic region: Secondary | ICD-10-CM | POA: Insufficient documentation

## 2011-02-15 ENCOUNTER — Other Ambulatory Visit: Payer: Self-pay | Admitting: Orthopedic Surgery

## 2011-02-15 DIAGNOSIS — M542 Cervicalgia: Secondary | ICD-10-CM

## 2011-02-15 DIAGNOSIS — R200 Anesthesia of skin: Secondary | ICD-10-CM

## 2011-02-15 DIAGNOSIS — M79603 Pain in arm, unspecified: Secondary | ICD-10-CM

## 2011-02-18 ENCOUNTER — Ambulatory Visit
Admission: RE | Admit: 2011-02-18 | Discharge: 2011-02-18 | Disposition: A | Discharge status: Home / Self Care | Payer: Commercial Managed Care - PPO | Source: Ambulatory Visit | Attending: Orthopedic Surgery | Admitting: Orthopedic Surgery

## 2011-02-18 ENCOUNTER — Other Ambulatory Visit: Payer: Self-pay | Admitting: Orthopedic Surgery

## 2011-02-18 DIAGNOSIS — M542 Cervicalgia: Secondary | ICD-10-CM

## 2011-02-18 DIAGNOSIS — R200 Anesthesia of skin: Secondary | ICD-10-CM

## 2011-02-18 DIAGNOSIS — M79601 Pain in right arm: Secondary | ICD-10-CM

## 2011-02-18 DIAGNOSIS — M79603 Pain in arm, unspecified: Secondary | ICD-10-CM

## 2011-03-04 ENCOUNTER — Ambulatory Visit
Admission: RE | Admit: 2011-03-04 | Discharge: 2011-03-04 | Disposition: A | Discharge status: Home / Self Care | Payer: Commercial Managed Care - PPO | Source: Ambulatory Visit | Attending: Orthopedic Surgery | Admitting: Orthopedic Surgery

## 2011-03-04 DIAGNOSIS — M79601 Pain in right arm: Secondary | ICD-10-CM

## 2011-03-04 DIAGNOSIS — M542 Cervicalgia: Secondary | ICD-10-CM

## 2011-04-15 NOTE — Assessment & Plan Note (Signed)
[facility]                           PRIMARY CARE OFFICE NOTE   NAME:Gina Jefferson, Gina Jefferson                      MRN:          [id number]  DATE:12/12/2006                            DOB:          Jan 18, 1951    CHIEF COMPLAINT:  New patient to practice.   HISTORY OF PRESENT ILLNESS:  The patient is a 60 year old African-  American female, here to establish primary care.  She was formerly  followed by Dr. Clovis Riley at [facility].  She has been followed for  hypertension.  This has been ongoing for at least ten years.  She states  that she was previously on hydrochlorothiazide, now well-controlled and  switched to Diovan.  She does check her blood pressure on a regular  basis.  She works as a Clinical biochemist at [employer].  Her blood pressures at home  are 120s to 130s systolic and less than 80 diastolic.   She also has a history of high cholesterol, has been on two different  statins in the past, that ultimately were discontinued secondary to  complaints of myalgias, especially in the upper leg/thigh.  Currently,  she is on an over-the-counter supplement called CholestOff.  The patient  states that her cholesterol has dropped 30 points since taking this over-  the-counter supplement.  She also is noted to take flax seed oil 1000 mg  once a day, as well as Co-Enzyme Q.   She denies any history of stroke, of heart disease or peripheral  vascular disease.   CURRENT MEDICATIONS:  1. Diovan 160 mg once a day.  2. CholestOff OTC once a day.  3. Co-Enzyme Q10/30 one a day.  4. Flax seed oil 1000 mg once a day.  5. Vivelle-Dot transdermal 0.1 mg two times weekly.   ALLERGIES TO MEDICATIONS:  Include PENICILLIN, which causes hives, and  CODEINE, which causes hallucinations.   PAST MEDICAL HISTORY SUMMARY:  1. Hypertension.  2. Hypercholesterolemia.  3. Status post cholecystectomy in 1993.  4. Status post appendectomy in 1987.  5. Status post hysterectomy, year  2000.  6. History of ovarian cyst, 1996.   SOCIAL HISTORY:  The patient is married, has two children, ages 26 and  53, works as a Clinical biochemist.   FAMILY HISTORY:  Mother deceased at age 85, secondary to congestive  heart failure, type 2 diabetes.  Father is alive at age 73 and has had  mini strokes and also has Alzheimer's dementia.   HABITS:  She does not drink.  She quit tobacco two years ago.  She  smoked 20 years, approximately ten cigarettes a day.   PREVENTATIVE CARE HISTORY:  Her last colonoscopy was 2005.  No  abnormalities were noted.  She had her last Pap in November of 2007.  Her last mammogram was in August of 2007.   REVIEW OF SYSTEMS:  No fevers or chills.  No HEENT symptoms.  No history  of chest pain, shortness of breath.  Occasional heartburn.  No nausea or  vomiting, constipation or diarrhea.  No dark stools or blood in her  stool.  All other systems  negative.   PHYSICAL EXAMINATION:  VITAL SIGNS:  Height is 5 feet 2, weight is 174  pounds.  Temperature is 97.3, pulse is 84, BP is 126/88 in the left arm  in seated position with a manual cuff.  IN GENERAL:  The patient is a very pleasant, well-developed, well-  nourished, 60 year old African-American female, in no apparent distress.  HEENT:  Normocephalic, atraumatic.  Pupils were equal and reactive to  light bilaterally.  Extraocular motility was intact.  The patient was  anicteric.  Conjunctiva was within normal limits.  External auditory  canals and tympanic membranes were clear bilaterally.  Oropharyngeal  exam was unremarkable.  NECK:  The neck was supple, no adenopathy, carotid bruit or thyromegaly.  CHEST EXAM:  Normal respiratory effort.  Chest was clear to auscultation  bilaterally, no rhonchi, rales or wheezing.  CARDIOVASCULAR:  Regular rate and rhythm, no significant murmurs, rubs  or gallops appreciated.  ABDOMEN:  Protuberant, nontender, positive bowel sounds, no  organomegaly.  MUSCULOSKELETAL EXAM:  No  clubbing, cyanosis or edema.  The patient had  intact pedis dorsalis pulses bilaterally, equal and symmetric.  NEUROLOGICALLY:  Cranial nerves II through XII were grossly intact.  She  was nonfocal.   IMPRESSION/RECOMMENDATIONS:  1. Hypertension, controlled.  2. History of hyperlipidemia with intolerance to Lipitor and Vytorin.  3. Obesity and family history of type 2 diabetes.  4. Health maintenance.   RECOMMENDATIONS:  The patient seems to be well-controlled on Diovan.  We  will repeat basic metabolic profile, check on BUN and creatinine.  Certainly, the patient is not high risk, as she does not have history of  coronary artery disease or peripheral vascular disease or stroke, and as  long as her LDL is less than 130, this may acceptable.  However, we will  repeat her lipids.  Although she did not tolerate Lipitor and Vytorin,  if her LDL is suboptimal, pravastatin may be alternative.   In terms of her general health status, I recommend starting a weight-  loss program and losing approximately 10% of her body weight in the next  six months to a year.  She has not tried Weight Watchers in the past.  Followup time is in approximately two to three months.     Barbette Hair. Artist Pais, DO  Electronically Signed    RDY/MedQ  DD: 12/12/2006  DT: 12/13/2006  Job #: [id number]

## 2011-04-26 ENCOUNTER — Telehealth: Payer: Self-pay | Admitting: Internal Medicine

## 2011-04-26 NOTE — Telephone Encounter (Signed)
Refill- zolpidem tartrate 10mg  tab. Take 1 tablet by mouth at bedtime as needed. Qty 30. Last fill 3.28.12

## 2011-04-28 ENCOUNTER — Other Ambulatory Visit: Payer: Self-pay | Admitting: *Deleted

## 2011-04-28 MED ORDER — ZOLPIDEM TARTRATE 10 MG PO TABS: 10.0000 mg | ORAL_TABLET | Freq: Every evening | ORAL | Status: DC | PRN

## 2011-04-28 NOTE — Telephone Encounter (Signed)
Faxed script back to [facility] pharmacy @ [phone].Marland KitchenMarland Kitchen5/31/12@1 :24pm/LMB

## 2011-05-12 ENCOUNTER — Other Ambulatory Visit: Payer: Self-pay | Admitting: Internal Medicine

## 2011-06-06 ENCOUNTER — Ambulatory Visit (INDEPENDENT_AMBULATORY_CARE_PROVIDER_SITE_OTHER): Payer: Commercial Managed Care - PPO | Admitting: Internal Medicine

## 2011-06-06 ENCOUNTER — Encounter: Payer: Self-pay | Admitting: Internal Medicine

## 2011-06-06 VITALS — BP 112/80 | HR 93 | Temp 98.0°F | Ht 62.0 in | Wt 162.4 lb

## 2011-06-06 DIAGNOSIS — Z0000 Encounter for general adult medical examination without abnormal findings: Secondary | ICD-10-CM | POA: Insufficient documentation

## 2011-06-06 DIAGNOSIS — E119 Type 2 diabetes mellitus without complications: Secondary | ICD-10-CM

## 2011-06-06 DIAGNOSIS — J029 Acute pharyngitis, unspecified: Secondary | ICD-10-CM

## 2011-06-06 DIAGNOSIS — I10 Essential (primary) hypertension: Secondary | ICD-10-CM

## 2011-06-06 MED ORDER — NYSTATIN 100000 UNIT/ML MT SUSP
500000.0000 [IU] | Freq: Four times a day (QID) | OROMUCOSAL | Status: DC
Start: 2011-06-06 — End: 2011-06-13

## 2011-06-06 MED ORDER — AZITHROMYCIN 250 MG PO TABS: ORAL_TABLET | ORAL | Status: DC

## 2011-06-06 NOTE — Assessment & Plan Note (Signed)
Mild to mod, for antibx course,  to f/u any worsening symptoms or concerns 

## 2011-06-06 NOTE — Assessment & Plan Note (Addendum)
stable overall by hx and exam, most recent data reviewed with pt, and pt to continue medical treatment as before, to let us know if onset any polys  Lab Results  Component Value Date   HGBA1C 5.8 01/14/2011

## 2011-06-06 NOTE — Progress Notes (Signed)
  Subjective:    Patient ID: Gina Jefferson, female    DOB: [birth year], 60 y.o.   MRN: [id number]  HPI  Here with 3 days acute onset fever, Severe ST, pressure, general weakness and malaise, and mostly non cough and Pt denies chest pain, increased sob or doe, wheezing, orthopnea, PND, increased LE swelling, palpitations, dizziness or syncope.  Pt denies new neurological symptoms such as new headache, or facial or extremity weakness or numbness   Pt denies polydipsia, polyuria, or low sugar symptoms such as weakness or confusion improved with po intake.  Overall good compliance with treatment, and good medicine tolerability. Past Medical History  Diagnosis Date  . ALLERGIC RHINITIS 09/24/2010  . DIABETES MELLITUS, TYPE II 01/21/2011  . GERD 06/23/2009  . HYPERLIPIDEMIA 06/23/2009  . HYPERTENSION 06/23/2009  . 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  . PALPITATIONS, OCCASIONAL 07/17/2009   No past surgical history on file.  reports that she has never smoked. She does not have any smokeless tobacco history on file. Her alcohol and drug histories not on file. family history is not on file. Allergies  Allergen Reactions  . Penicillins Rash  . Metformin     REACTION: vomiting and diarrhea  . Statins     REACTION: Myositis   Current Outpatient Prescriptions on File Prior to Visit  Medication Sig Dispense Refill  . EXFORGE 5-160 MG per tablet TAKE 1 TABLET BY MOUTH ONCE A DAY  90 tablet  1  . JANUVIA 100 MG tablet TAKE 1 TABLET BY MOUTH DAILY  90 tablet  1  . zolpidem (AMBIEN) 10 MG tablet Take 1 tablet (10 mg total) by mouth at bedtime as needed.  30 tablet  1   Review of Systems Review of Systems  Constitutional: Negative for diaphoresis and unexpected weight change.  HENT: Negative for drooling and tinnitus.   Eyes: Negative for photophobia and visual disturbance.  Respiratory: Negative for choking and stridor.   Gastrointestinal: Negative for vomiting and blood in stool.       Objective:   Physical  Exam BP 112/80  Pulse 93  Temp(Src) 98 F (36.7 C) (Oral)  Ht 5\' 2"  (1.575 m)  Wt 162 lb 6 oz (73.653 kg)  BMI 29.70 kg/m2  SpO2 92% Physical Exam  VS noted, mild ill appearing Constitutional: Pt appears well-developed and well-nourished.  HENT: Head: Normocephalic.  Right Ear: External ear normal.  Left Ear: External ear normal.  Bilat tm's mild erythema.  Sinus nontender.  Pharynx severe erythema with exudate Eyes: Conjunctivae and EOM are normal. Pupils are equal, round, and reactive to light.  Neck: Normal range of motion. Neck supple. Bilat submandib LA marked tender noted Cardiovascular: Normal rate and regular rhythm.   Pulmonary/Chest: Effort normal and breath sounds normal.  Abd:  Soft, NT, non-distended, + BS Neurological: Pt is alert. No cranial nerve deficit.  Skin: Skin is warm. No erythema.  Psychiatric: Pt behavior is normal. Thought content normal.         Assessment & Plan:

## 2011-06-06 NOTE — Patient Instructions (Addendum)
Take all new medications as prescribed Continue all other medications as before You can also take Delsym OTC for cough, and/or Mucinex (or it's generic off brand) for congestion Please monitor for any symptoms to suggest high blood sugar such as drinking more fluids and frequent urination Please return August 2012 as planned with Dr Felicity Coyer, or sooner if needed

## 2011-06-06 NOTE — Assessment & Plan Note (Signed)
stable overall by hx and exam, most recent data reviewed with pt, and pt to continue medical treatment as before  Lab Results  Component Value Date   WBC 11.3* 06/23/2009   HGB 13.6 06/23/2009   HCT 38.8 06/23/2009   PLT 222.0 06/23/2009   CHOL 247* 07/10/2009   TRIG 101.0 07/10/2009   HDL 42.50 07/10/2009   LDLDIRECT 177.8 07/10/2009   ALT 26 01/14/2011   AST 27 01/14/2011   NA 141 01/14/2011   K 3.9 01/14/2011   CL 107 01/14/2011   CREATININE 0.7 01/14/2011   BUN 15 01/14/2011   CO2 29 01/14/2011   TSH 0.923 07/17/2009   HGBA1C 5.8 01/14/2011

## 2011-06-09 ENCOUNTER — Telehealth: Payer: Self-pay

## 2011-06-09 MED ORDER — DOXYCYCLINE HYCLATE 100 MG PO TABS: 100.0000 mg | ORAL_TABLET | Freq: Two times a day (BID) | ORAL | Status: AC

## 2011-06-09 NOTE — Telephone Encounter (Signed)
Pt called stating she is on the last day of Z pak but hoarseness has not improved and she now has a low grade temperature. Pt is requesting MD's advisement.

## 2011-06-09 NOTE — Telephone Encounter (Signed)
Med changed to doxy course

## 2011-06-09 NOTE — Telephone Encounter (Signed)
Pt advised of Rx/pharmacy 

## 2011-06-13 ENCOUNTER — Telehealth: Payer: Self-pay | Admitting: Internal Medicine

## 2011-06-13 ENCOUNTER — Ambulatory Visit: Payer: Commercial Managed Care - PPO | Admitting: Internal Medicine

## 2011-06-13 ENCOUNTER — Telehealth: Payer: Self-pay | Admitting: *Deleted

## 2011-06-13 DIAGNOSIS — B370 Candidal stomatitis: Secondary | ICD-10-CM | POA: Insufficient documentation

## 2011-06-13 MED ORDER — MAGIC MOUTHWASH: 5.0000 mL | Freq: Two times a day (BID) | ORAL | Status: DC | PRN

## 2011-06-13 MED ORDER — FLUCONAZOLE 100 MG PO TABS: ORAL_TABLET | ORAL | Status: DC

## 2011-06-13 NOTE — Telephone Encounter (Signed)
Ok to try magic mouthwash instead  Done per emr to pharmacy

## 2011-06-13 NOTE — Telephone Encounter (Signed)
Pt states that the Nystatin is not working on her oral thrush & is requesting a new Rx from either Dr Jonny Ruiz or Dr Felicity Coyer.

## 2011-06-13 NOTE — Telephone Encounter (Signed)
Pt informed [and states that Dr Leone Payor called in Rx for Diflucan]

## 2011-06-13 NOTE — Telephone Encounter (Signed)
Persistent oral thrush with yellow-tan coat on tongue despite nystatin - oral cavity and posterior pharynx examined - has been on 2 antibiotics. Nystatin x 7 days. Does have some soreness. Will rx fluconazole x 1 week. Stop the nystatin.

## 2011-06-14 ENCOUNTER — Encounter: Payer: Self-pay | Admitting: Internal Medicine

## 2011-06-15 ENCOUNTER — Ambulatory Visit (INDEPENDENT_AMBULATORY_CARE_PROVIDER_SITE_OTHER): Payer: Commercial Managed Care - PPO | Admitting: Internal Medicine

## 2011-06-15 ENCOUNTER — Encounter: Payer: Self-pay | Admitting: Internal Medicine

## 2011-06-15 DIAGNOSIS — J029 Acute pharyngitis, unspecified: Secondary | ICD-10-CM

## 2011-06-15 DIAGNOSIS — J040 Acute laryngitis: Secondary | ICD-10-CM

## 2011-06-15 DIAGNOSIS — B370 Candidal stomatitis: Secondary | ICD-10-CM

## 2011-06-15 MED ORDER — OMEPRAZOLE 20 MG PO CPDR: 20.0000 mg | DELAYED_RELEASE_CAPSULE | Freq: Every day | ORAL | Status: DC

## 2011-06-15 MED ORDER — BENZONATATE 200 MG PO CAPS: 200.0000 mg | ORAL_CAPSULE | Freq: Every day | ORAL | Status: AC

## 2011-06-15 NOTE — Patient Instructions (Signed)
It was good to see you today. Rapid strep test today negative - If you develop worsening symptoms or fever, call and we can reconsider antibiotics, but it does not appear necessary to use antibiotics again at this time. Use Prilosec for acid suppression and tessalon at night for throat/cough x 2 weeks as discussed - then as needed Continue diflucan as per Dr. Leone Payor If continued problems after 2 weeks on this treatment, call and we will refer to ENT as discussed

## 2011-06-15 NOTE — Progress Notes (Signed)
  Subjective:     Gina Jefferson is a 60 y.o. female who presents for evaluation of sore throat. Associated symptoms include enlarged tonsils, hoarseness, pain while swallowing, post nasal drip, sinus and nasal congestion, sore throat and swollen glands. Onset of symptoms was 3 weeks ago, and have been unchanged since that time. She is drinking plenty of fluids. She has not had a recent close exposure to someone with proven streptococcal pharyngitis. Seen by my partner JWJ 7/9 for same - tx Zpak and nystatin soln - not improved - now on diflucan for thrush symptoms by GI ([facility]) x last 3 days  The following portions of the patient's history were reviewed and updated as appropriate: allergies, current medications, past family history, past medical history, past social history, past surgical history and problem list.  Review of Systems Pertinent items are noted in HPI.    Objective:    BP 126/82  Pulse 75  Temp(Src) 98.3 F (36.8 C) (Oral)  Ht 5\' 2"  (1.575 m)  SpO2 95% General appearance: alert, cooperative, no distress and hoarse Eyes: conjunctivae/corneas clear. PERRL, EOM's intact. Fundi benign. Ears: normal TM's and external ear canals both ears Throat: abnormal findings: mild oropharyngeal erythema and thrush Neck: mild anterior cervical adenopathy, no carotid bruit, no JVD, supple, symmetrical, trachea midline and thyroid not enlarged, symmetric, no tenderness/mass/nodules Lungs: clear to auscultation bilaterally Heart: regular rate and rhythm, S1, S2 normal, no murmur, click, rub or gallop  Laboratory Strep test done. Results:negative.    Assessment:    Acute pharyngitis, likely  Viral pharyngitis.  post infectious laryngitis   Plan:    Use of OTC analgesics recommended as well as salt water gargles. Use of decongestant recommended. continue diflucan as per GI and add PPI + tessalon qhs for next 2 weeks, then prn

## 2011-06-23 ENCOUNTER — Telehealth: Payer: Self-pay

## 2011-06-23 NOTE — Telephone Encounter (Signed)
Patient reports continued thrush she has tried the diflucan and nystatin with no success.  She reports a dry scratchy throat, she has a white/yellow coating on her tongue.  Dr Leone Payor please advise.

## 2011-06-24 MED ORDER — DIPHENHYD-HYDROCORT-NYSTATIN MT SUSP: OROMUCOSAL | Status: DC

## 2011-06-24 NOTE — Telephone Encounter (Signed)
Discussed with Dr Leone Payor patient to try magic mouthwash for 10 days Patient advised

## 2011-06-26 NOTE — Telephone Encounter (Signed)
She said she tried Pharmacist, community Mouthwash already Since that and fluconazole did not fix this would not rx any medicine She should scrub her tongue with toothbrush at least twice a day Eventually should resolve

## 2011-07-22 ENCOUNTER — Other Ambulatory Visit (INDEPENDENT_AMBULATORY_CARE_PROVIDER_SITE_OTHER): Payer: 59

## 2011-07-22 ENCOUNTER — Ambulatory Visit (INDEPENDENT_AMBULATORY_CARE_PROVIDER_SITE_OTHER): Payer: 59 | Admitting: Internal Medicine

## 2011-07-22 ENCOUNTER — Encounter: Payer: Self-pay | Admitting: Internal Medicine

## 2011-07-22 DIAGNOSIS — E119 Type 2 diabetes mellitus without complications: Secondary | ICD-10-CM

## 2011-07-22 DIAGNOSIS — G4700 Insomnia, unspecified: Secondary | ICD-10-CM

## 2011-07-22 DIAGNOSIS — R490 Dysphonia: Secondary | ICD-10-CM

## 2011-07-22 DIAGNOSIS — I10 Essential (primary) hypertension: Secondary | ICD-10-CM

## 2011-07-22 LAB — HEMOGLOBIN A1C: Hgb A1c MFr Bld: 5.9 % (ref 4.6–6.5)

## 2011-07-22 MED ORDER — ESOMEPRAZOLE MAGNESIUM 40 MG PO CPDR: 40.0000 mg | DELAYED_RELEASE_CAPSULE | Freq: Every day | ORAL | Status: DC

## 2011-07-22 MED ORDER — AMITRIPTYLINE HCL 10 MG PO TABS: 10.0000 mg | ORAL_TABLET | Freq: Every day | ORAL | Status: DC

## 2011-07-22 NOTE — Patient Instructions (Signed)
It was good to see you today. Medications reviewed, use amitriptyline to help with sleep and continue nexium - Your prescription(s) have been submitted to your pharmacy. Please take as directed and contact our office if you believe you are having problem(s) with the medication(s). Test(s) ordered today. Your results will be called to you after review (48-72hours after test completion). If any changes need to be made, you will be notified at that time. we'll make referral to ENT for your hoarseness. Our office will contact you regarding appointment(s) once made.  Please schedule followup in 4 months, call sooner if problems.

## 2011-07-22 NOTE — Assessment & Plan Note (Signed)
stable overall by hx and exam,  The current medical regimen is effective;  continue present plan and medications.   Lab Results  Component Value Date   HGBA1C 5.8 01/14/2011

## 2011-07-22 NOTE — Progress Notes (Signed)
  Subjective:    Patient ID: Gina Jefferson, female    DOB: June 27, 1951, 60 y.o.   MRN: [id number]  HPI Here for follow up - reviewed chronic medical issues:  Continued hoarseness - onset 2 months ago No sore throat   hx grief rxn summer 2010 due to unexpected death of son  met with [employer] [facility]  feeling better. did not feel daily SSRI needed but pt uses "as needed"  sleep not good as 2 year anniversary coming up - ?other med for sleep  htn - reports compliance with ongoing medical treatment and no changes in medication dose or frequency. denies adverse side effects related to current therapy.   dm2 - hx intol to metformin (gasrtic upset)- reports compliance with ongoing medical treatment and no changes in medication dose or frequency. denies adverse side effects related to current therapy. does not check cbgs regualry   dyslipidemia - intol of statin tx in past - uses fishoil and otc tx - working on diet and exercise   Past Medical History  Diagnosis Date  . ALLERGIC RHINITIS 09/24/2010  . DIABETES MELLITUS, TYPE II 01/21/2011  . GERD 06/23/2009  . HYPERLIPIDEMIA 06/23/2009  . HYPERTENSION 06/23/2009  . 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  . DDD (degenerative disc disease)     cervical spine with right hand numbness   Review of Systems  Cardiovascular: Negative for chest pain.  Neurological: Negative for headaches.       Objective:   Physical Exam BP 124/82  Pulse 81  Temp(Src) 98.4 F (36.9 C) (Oral)  Ht 5\' 2"  (1.575 m)  Wt 159 lb 12.8 oz (72.485 kg)  BMI 29.23 kg/m2  SpO2 97% Constitutional: She is oriented to person, place, and time. She appears well-developed and well-nourished. No distress.  Cardiovascular: Normal rate, regular rhythm and normal heart sounds.  No murmur heard. No BLE edema. Pulmonary/Chest: Effort normal and breath sounds normal. No respiratory distress. She has no wheezes.  Psychiatric: She has a normal mood and affect. Her behavior is normal. Judgment  and thought content normal.   Lab Results  Component Value Date   WBC 11.3* 06/23/2009   HGB 13.6 06/23/2009   HCT 38.8 06/23/2009   PLT 222.0 06/23/2009   CHOL 247* 07/10/2009   TRIG 101.0 07/10/2009   HDL 42.50 07/10/2009   LDLDIRECT 177.8 07/10/2009   ALT 26 01/14/2011   AST 27 01/14/2011   NA 141 01/14/2011   K 3.9 01/14/2011   CL 107 01/14/2011   CREATININE 0.7 01/14/2011   BUN 15 01/14/2011   CO2 29 01/14/2011   TSH 0.923 07/17/2009   HGBA1C 5.8 01/14/2011        Assessment & Plan:  Hoarseness - ongoing > 2 mo, hx tobacco - refer to ENT - continue PPI and antihist  Insomnia - add prn amitriptyline  Also See problem list. Medications and labs reviewed today.

## 2011-07-22 NOTE — Assessment & Plan Note (Signed)
The current medical regimen is effective;  continue present plan and medications. BP Readings from Last 3 Encounters:  07/22/11 124/82  06/15/11 126/82  06/06/11 112/80

## 2011-08-05 ENCOUNTER — Telehealth: Payer: Self-pay

## 2011-08-05 MED ORDER — ZOLPIDEM TARTRATE 10 MG PO TABS: 10.0000 mg | ORAL_TABLET | Freq: Every evening | ORAL | Status: DC | PRN

## 2011-08-05 NOTE — Telephone Encounter (Signed)
Ok done

## 2011-08-05 NOTE — Telephone Encounter (Signed)
Pt called stating Amitriptyline is not helping. Pt is requesting to have her previous medication, Ambien refilled, please advise.

## 2011-08-05 NOTE — Telephone Encounter (Signed)
Pt informed, Rx faxed to pharmacy 

## 2011-09-29 ENCOUNTER — Other Ambulatory Visit: Payer: Self-pay | Admitting: Obstetrics and Gynecology

## 2011-09-29 DIAGNOSIS — R928 Other abnormal and inconclusive findings on diagnostic imaging of breast: Secondary | ICD-10-CM

## 2011-10-12 ENCOUNTER — Ambulatory Visit
Admission: RE | Admit: 2011-10-12 | Discharge: 2011-10-12 | Disposition: A | Discharge status: Home / Self Care | Payer: 59 | Source: Ambulatory Visit | Attending: Obstetrics and Gynecology | Admitting: Obstetrics and Gynecology

## 2011-10-12 DIAGNOSIS — R928 Other abnormal and inconclusive findings on diagnostic imaging of breast: Secondary | ICD-10-CM

## 2011-10-29 DIAGNOSIS — G5601 Carpal tunnel syndrome, right upper limb: Secondary | ICD-10-CM

## 2011-10-29 HISTORY — DX: Carpal tunnel syndrome, right upper limb: G56.01

## 2011-11-16 ENCOUNTER — Other Ambulatory Visit: Payer: Self-pay | Admitting: *Deleted

## 2011-11-16 MED ORDER — AMLODIPINE BESYLATE-VALSARTAN 5-160 MG PO TABS: 1.0000 | ORAL_TABLET | Freq: Every day | ORAL | Status: DC

## 2011-11-16 MED ORDER — SITAGLIPTIN PHOSPHATE 100 MG PO TABS: 100.0000 mg | ORAL_TABLET | Freq: Every day | ORAL | Status: DC

## 2011-11-17 ENCOUNTER — Encounter (HOSPITAL_BASED_OUTPATIENT_CLINIC_OR_DEPARTMENT_OTHER): Payer: Self-pay | Admitting: *Deleted

## 2011-11-17 NOTE — Pre-Procedure Instructions (Signed)
To come for BMET and EKG 

## 2011-11-23 ENCOUNTER — Encounter (HOSPITAL_BASED_OUTPATIENT_CLINIC_OR_DEPARTMENT_OTHER)
Admission: RE | Admit: 2011-11-23 | Discharge: 2011-11-23 | Disposition: A | Discharge status: Home / Self Care | Payer: 59 | Source: Ambulatory Visit | Attending: Orthopaedic Surgery | Admitting: Orthopaedic Surgery

## 2011-11-23 ENCOUNTER — Other Ambulatory Visit: Payer: Self-pay

## 2011-11-23 LAB — BASIC METABOLIC PANEL
CO2: 27 mEq/L (ref 19–32)
Calcium: 9.5 mg/dL (ref 8.4–10.5)
Creatinine, Ser: 0.76 mg/dL (ref 0.50–1.10)
GFR calc Af Amer: >90 mL/min (ref >90)
Sodium: 142 mEq/L (ref 135–145)

## 2011-11-24 ENCOUNTER — Encounter (HOSPITAL_BASED_OUTPATIENT_CLINIC_OR_DEPARTMENT_OTHER)
Admission: RE | Disposition: A | Discharge status: Home / Self Care | Payer: Self-pay | Source: Ambulatory Visit | Attending: Orthopaedic Surgery

## 2011-11-24 ENCOUNTER — Encounter (HOSPITAL_BASED_OUTPATIENT_CLINIC_OR_DEPARTMENT_OTHER): Payer: Self-pay | Admitting: *Deleted

## 2011-11-24 ENCOUNTER — Ambulatory Visit (HOSPITAL_BASED_OUTPATIENT_CLINIC_OR_DEPARTMENT_OTHER): Payer: 59 | Admitting: *Deleted

## 2011-11-24 ENCOUNTER — Ambulatory Visit (HOSPITAL_BASED_OUTPATIENT_CLINIC_OR_DEPARTMENT_OTHER)
Admission: RE | Admit: 2011-11-24 | Discharge: 2011-11-24 | Disposition: A | Discharge status: Home / Self Care | Payer: 59 | Source: Ambulatory Visit | Attending: Orthopaedic Surgery | Admitting: Orthopaedic Surgery

## 2011-11-24 DIAGNOSIS — G5601 Carpal tunnel syndrome, right upper limb: Secondary | ICD-10-CM | POA: Diagnosis present

## 2011-11-24 DIAGNOSIS — Z01812 Encounter for preprocedural laboratory examination: Secondary | ICD-10-CM | POA: Insufficient documentation

## 2011-11-24 DIAGNOSIS — Z01810 Encounter for preprocedural cardiovascular examination: Secondary | ICD-10-CM | POA: Insufficient documentation

## 2011-11-24 DIAGNOSIS — I10 Essential (primary) hypertension: Secondary | ICD-10-CM | POA: Insufficient documentation

## 2011-11-24 DIAGNOSIS — K219 Gastro-esophageal reflux disease without esophagitis: Secondary | ICD-10-CM | POA: Insufficient documentation

## 2011-11-24 DIAGNOSIS — E119 Type 2 diabetes mellitus without complications: Secondary | ICD-10-CM | POA: Insufficient documentation

## 2011-11-24 DIAGNOSIS — G5600 Carpal tunnel syndrome, unspecified upper limb: Secondary | ICD-10-CM | POA: Insufficient documentation

## 2011-11-24 HISTORY — DX: Other seasonal allergic rhinitis: J30.2

## 2011-11-24 HISTORY — DX: Anxiety disorder, unspecified: F41.9

## 2011-11-24 HISTORY — DX: Unspecified osteoarthritis, unspecified site: M19.90

## 2011-11-24 HISTORY — DX: Carpal tunnel syndrome, right upper limb: G56.01

## 2011-11-24 HISTORY — PX: CARPAL TUNNEL RELEASE: SHX101

## 2011-11-24 LAB — GLUCOSE, CAPILLARY: Glucose-Capillary: 90 mg/dL (ref 70–99)

## 2011-11-24 LAB — POCT HEMOGLOBIN-HEMACUE: Hemoglobin: 13.7 g/dL (ref 12.0–15.0)

## 2011-11-24 SURGERY — CARPAL TUNNEL RELEASE
Anesthesia: General | Laterality: Right | Wound class: Clean

## 2011-11-24 MED ORDER — MORPHINE SULFATE 2 MG/ML IJ SOLN: 0.0500 mg/kg | INTRAMUSCULAR | Status: DC | PRN

## 2011-11-24 MED ORDER — BUPIVACAINE HCL (PF) 0.25 % IJ SOLN
INTRAMUSCULAR | Status: DC | PRN
  Administered 2011-11-24: 5 mL

## 2011-11-24 MED ORDER — SODIUM CHLORIDE 0.9 % IV SOLN: INTRAVENOUS | Status: DC

## 2011-11-24 MED ORDER — LIDOCAINE HCL (CARDIAC) 20 MG/ML IV SOLN
INTRAVENOUS | Status: DC | PRN
  Administered 2011-11-24: 40 mg via INTRAVENOUS

## 2011-11-24 MED ORDER — FENTANYL CITRATE 0.05 MG/ML IJ SOLN: 50.0000 ug | INTRAMUSCULAR | Status: DC | PRN

## 2011-11-24 MED ORDER — MIDAZOLAM HCL 2 MG/2ML IJ SOLN: 0.5000 mg | INTRAMUSCULAR | Status: DC | PRN

## 2011-11-24 MED ORDER — FENTANYL CITRATE 0.05 MG/ML IJ SOLN
25.0000 ug | INTRAMUSCULAR | Status: DC | PRN
  Administered 2011-11-24 (×3): 25 ug via INTRAVENOUS

## 2011-11-24 MED ORDER — METOCLOPRAMIDE HCL 5 MG/ML IJ SOLN: 10.0000 mg | Freq: Once | INTRAMUSCULAR | Status: DC | PRN

## 2011-11-24 MED ORDER — MIDAZOLAM HCL 5 MG/5ML IJ SOLN
INTRAMUSCULAR | Status: DC | PRN
  Administered 2011-11-24: 0.5 mg via INTRAVENOUS

## 2011-11-24 MED ORDER — LACTATED RINGERS IV SOLN
INTRAVENOUS | Status: DC
  Administered 2011-11-24 (×2): via INTRAVENOUS

## 2011-11-24 MED ORDER — ONDANSETRON HCL 4 MG/2ML IJ SOLN
INTRAMUSCULAR | Status: DC | PRN
  Administered 2011-11-24 (×2): 4 mg via INTRAVENOUS

## 2011-11-24 MED ORDER — METOCLOPRAMIDE HCL 5 MG/ML IJ SOLN
INTRAMUSCULAR | Status: DC | PRN
  Administered 2011-11-24 (×2): 10 mg via INTRAVENOUS

## 2011-11-24 MED ORDER — FENTANYL CITRATE 0.05 MG/ML IJ SOLN
INTRAMUSCULAR | Status: DC | PRN
  Administered 2011-11-24 (×2): 25 ug via INTRAVENOUS
  Administered 2011-11-24: 50 ug via INTRAVENOUS

## 2011-11-24 MED ORDER — PROPOFOL 10 MG/ML IV EMUL
INTRAVENOUS | Status: DC | PRN
  Administered 2011-11-24: 200 mg via INTRAVENOUS

## 2011-11-24 MED ORDER — HYDROCODONE-ACETAMINOPHEN 5-325 MG PO TABS: ORAL_TABLET | ORAL | Status: DC

## 2011-11-24 MED ORDER — IBUPROFEN 200 MG PO TABS: 600.0000 mg | ORAL_TABLET | Freq: Three times a day (TID) | ORAL | Status: AC | PRN

## 2011-11-24 SURGICAL SUPPLY — 49 items
BANDAGE COBAN STERILE 2 (GAUZE/BANDAGES/DRESSINGS) IMPLANT
BANDAGE ELASTIC 3 VELCRO ST LF (GAUZE/BANDAGES/DRESSINGS) ×1 IMPLANT
BLADE SURG 15 STRL LF DISP TIS (BLADE) ×1 IMPLANT
BLADE SURG 15 STRL SS (BLADE) ×4
BNDG CMPR 9X4 STRL LF SNTH (GAUZE/BANDAGES/DRESSINGS) ×1
BNDG ESMARK 4X9 LF (GAUZE/BANDAGES/DRESSINGS) ×1 IMPLANT
CANISTER SUCTION 1200CC (MISCELLANEOUS) IMPLANT
COVER MAYO STAND STRL (DRAPES) ×2 IMPLANT
COVER TABLE BACK 60X90 (DRAPES) ×2 IMPLANT
DRAPE EXTREMITY T 121X128X90 (DRAPE) ×2 IMPLANT
DRSG EMULSION OIL 3X3 NADH (GAUZE/BANDAGES/DRESSINGS) ×1 IMPLANT
DRSG PAD ABDOMINAL 8X10 ST (GAUZE/BANDAGES/DRESSINGS) ×2 IMPLANT
DURAPREP 26ML APPLICATOR (WOUND CARE) ×2 IMPLANT
ELECT NDL TIP 2.8 STRL (NEEDLE) ×1 IMPLANT
ELECT NEEDLE TIP 2.8 STRL (NEEDLE) ×2 IMPLANT
ELECT REM PT RETURN 9FT ADLT (ELECTROSURGICAL) ×2
ELECTRODE REM PT RTRN 9FT ADLT (ELECTROSURGICAL) ×1 IMPLANT
GAUZE SPONGE 4X4 12PLY STRL LF (GAUZE/BANDAGES/DRESSINGS) ×1 IMPLANT
GLOVE BIO SURGEON STRL SZ8.5 (GLOVE) ×1 IMPLANT
GLOVE BIOGEL PI IND STRL 8 (GLOVE) ×1 IMPLANT
GLOVE BIOGEL PI INDICATOR 8 (GLOVE) ×2
GLOVE ECLIPSE 8.0 STRL XLNG CF (GLOVE) ×2 IMPLANT
GLOVE INDICATOR 8.0 STRL GRN (GLOVE) ×1 IMPLANT
GOWN PREVENTION PLUS XLARGE (GOWN DISPOSABLE) ×2 IMPLANT
GOWN PREVENTION PLUS XXLARGE (GOWN DISPOSABLE) ×1 IMPLANT
GOWN STRL REIN 2XL XLG LVL4 (GOWN DISPOSABLE) ×1 IMPLANT
NDL HYPO 25X1 1.5 SAFETY (NEEDLE) IMPLANT
NEEDLE HYPO 25X1 1.5 SAFETY (NEEDLE) ×2 IMPLANT
NS IRRIG 1000ML POUR BTL (IV SOLUTION) ×2 IMPLANT
PACK BASIN DAY SURGERY FS (CUSTOM PROCEDURE TRAY) ×2 IMPLANT
PAD CAST 3X4 CTTN HI CHSV (CAST SUPPLIES) ×1 IMPLANT
PADDING CAST ABS 3INX4YD NS (CAST SUPPLIES) ×1
PADDING CAST ABS 4INX4YD NS (CAST SUPPLIES) ×1
PADDING CAST ABS COTTON 3X4 (CAST SUPPLIES) ×1 IMPLANT
PADDING CAST ABS COTTON 4X4 ST (CAST SUPPLIES) ×1 IMPLANT
PADDING CAST COTTON 3X4 STRL (CAST SUPPLIES) ×2
PADDING WEBRIL 3 STERILE (GAUZE/BANDAGES/DRESSINGS) ×1 IMPLANT
PENCIL BUTTON HOLSTER BLD 10FT (ELECTRODE) ×2 IMPLANT
SPLINT PLASTER 3X15 (CAST SUPPLIES) ×8 IMPLANT
SPLINT PLASTER CAST XFAST 3X15 (CAST SUPPLIES) IMPLANT
SPLINT PLASTER XTRA FASTSET 3X (CAST SUPPLIES)
STOCKINETTE 4X48 STRL (DRAPES) ×2 IMPLANT
SUCTION FRAZIER TIP 10 FR DISP (SUCTIONS) IMPLANT
SUT ETHILON 4 0 PS 2 18 (SUTURE) IMPLANT
SYR BULB 3OZ (MISCELLANEOUS) ×2 IMPLANT
SYR CONTROL 10ML LL (SYRINGE) ×1 IMPLANT
TOWEL OR 17X24 6PK STRL BLUE (TOWEL DISPOSABLE) ×2 IMPLANT
TUBE CONNECTING 20X1/4 (TUBING) IMPLANT
UNDERPAD 30X30 INCONTINENT (UNDERPADS AND DIAPERS) ×2 IMPLANT

## 2011-11-24 NOTE — Addendum Note (Signed)
Addendum  created 11/24/11 1637 by Raheel Kunkle Wolfe Cypress Fanfan   Modules edited:Anesthesia Medication Administration    

## 2011-11-24 NOTE — Anesthesia Procedure Notes (Addendum)
Procedure Name: LMA Insertion Date/Time: 11/24/2011 1:43 PM Performed by: Meyer Russel Pre-anesthesia Checklist: Patient identified, Emergency Drugs available, Suction available, Patient being monitored and Timeout performed Patient Re-evaluated:Patient Re-evaluated prior to inductionOxygen Delivery Method: Circle System Utilized Preoxygenation: Pre-oxygenation with 100% oxygen Intubation Type: IV induction Ventilation: Mask ventilation without difficulty LMA: LMA inserted LMA Size: 4.0 Number of attempts: 1 Airway Equipment and Method: bite block Placement Confirmation: positive ETCO2 and breath sounds checked- equal and bilateral Tube secured with: Tape Dental Injury: Teeth and Oropharynx as per pre-operative assessment

## 2011-11-24 NOTE — Brief Op Note (Signed)
11/24/2011  2:22 PM  PATIENT:  Gina Jefferson  60 y.o. female  PRE-OPERATIVE DIAGNOSIS:  C.T.S. ON RIGHT   POST-OPERATIVE DIAGNOSIS:  C.T.S. ON RIGHT   PROCEDURE:  Procedure(s): CARPAL TUNNEL RELEASE  SURGEON:  Surgeon(s): Valeria Batman, MD  PHYSICIAN ASSISTANT: Jacqualine Code, PAC ASSISTANTS: none   ANESTHESIA:   general  EBL:  Total I/O In: 1200 [I.V.:1200] Out: -   BLOOD ADMINISTERED:none  DRAINS: none   LOCAL MEDICATIONS USED:  XYLOCAINE 5CC  SPECIMEN:  No Specimen  DISPOSITION OF SPECIMEN:  N/A  COUNTS:  YES  TOURNIQUET:   Total Tourniquet Time Documented: Upper Arm (Right) - 28 minutes  DICTATION: .Other Dictation: Dictation Number [id number]  PLAN OF CARE: Discharge to home after PACU  PATIENT DISPOSITION:  PACU - hemodynamically stable.   Delay start of Pharmacological VTE agent (>24hrs) due to surgical blood loss or risk of bleeding:  {YES/NO/NOT APPLICABLE:20182

## 2011-11-24 NOTE — H&P (Signed)
Gina Jefferson MRN:  [id number] DOB/SEX:  1951-06-20/female  CHIEF COMPLAINT:  RIGHT HAND NUMBNESS  HISTORY: Patient is a 60 y.o. female presented with a history of numbness in her right hand.  Had EMG's consistent with moderate to severe carpal tunnel syndrome.  Indicated for right CTR.  PAST MEDICAL HISTORY: Patient Active Problem List  Diagnoses Date Noted  . Oral thrush 06/13/2011  . DIABETES MELLITUS, TYPE II 01/21/2011  . ALLERGIC RHINITIS 09/24/2010  . GRIEF REACTION 10/16/2009  . PALPITATIONS, OCCASIONAL 07/17/2009  . GERD 06/23/2009  . HYPERLIPIDEMIA 06/23/2009  . HYPERTENSION 06/23/2009  . HOT FLASHES 12/15/2008  . 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   Past Medical History  Diagnosis Date  . Anxiety   . HYPERTENSION     under control; has been on med. x 15 yrs.  . Seasonal allergies   . Arthritis     knee, c-spine  . Diabetes mellitus     NIDDM  . GERD   . Carpal tunnel syndrome of right wrist 10/2011   Past Surgical History  Procedure Date  . Appendectomy   . Cholecystectomy   . Abdominal hysterectomy     complete  . Oophorectomy   . Tonsillectomy   . Wisdom tooth extraction   . Ovarian cyst removal      MEDICATIONS:   Prescriptions prior to admission  Medication Sig Dispense Refill  . amLODipine-valsartan (EXFORGE) 5-160 MG per tablet Take 1 tablet by mouth daily.  90 tablet  1  . Biotin 1000 MCG tablet Take 1,000 mcg by mouth daily.        . Black Cohosh 540 MG CAPS Take by mouth daily.        . cetirizine (ZYRTEC) 10 MG tablet Take 10 mg by mouth daily.        . diclofenac (VOLTAREN) 75 MG EC tablet Take 75 mg by mouth 2 (two) times daily.        Marland Kitchen esomeprazole (NEXIUM) 40 MG capsule Take 1 capsule (40 mg total) by mouth daily before breakfast.  30 capsule  3  . Multiple Vitamin (MULTIVITAMIN) tablet Take 1 tablet by mouth daily.        . sertraline (ZOLOFT) 25 MG tablet Take 12.5 mg by mouth as needed.       . sitaGLIPtin (JANUVIA) 100 MG tablet Take 1  tablet (100 mg total) by mouth daily.  90 tablet  1  . zolpidem (AMBIEN) 10 MG tablet Take 1 tablet (10 mg total) by mouth at bedtime as needed.  30 tablet  1    ALLERGIES:   Allergies  Allergen Reactions  . Penicillins Rash  . Statins Other (See Comments)    Muscle aches  . Metformin Diarrhea and Other (See Comments)    Vomiting     REVIEW OF SYSTEMS:  postive for HTN, GERD, NIDDM.   FAMILY HISTORY:   Family History  Problem Relation Age of Onset  . Diabetes Mother   . Stroke Father   . Alzheimer's disease Father   . Cancer Maternal Grandfather     stomach cancer    SOCIAL HISTORY:   History  Substance Use Topics  . Smoking status: Former Smoker    Types: Cigarettes    Quit date: 11/29/2003  . Smokeless tobacco: Never Used  . Alcohol Use: Yes     occasionally     EXAMINATION:  Vital signs in last 24 hours: Temp:  [98.5 F (36.9 C)] 98.5 F (36.9 C) (12/27 1244) Pulse Rate:  [  89] 89  (12/27 1244) Resp:  [16] 16  (12/27 1244) BP: (134)/(87) 134/87 mmHg (12/27 1244) SpO2:  [97 %] 97 % (12/27 1244)  General Appearance: Alert, cooperative, no distress, appears stated age    Head: Normocephalic, without obvious abnormality, atraumatic    Eyes: PERRL, conjunctiva/corneas clear, EOM's intact    Ears: Normal TM's and external ear canals, both ears    Nose:Nares normal, septum midline, mucosa normal    Throat:  Lips, mucosa, and tongue normal  ;  Neck: Neck:  Supple, symmetrical, trachea midline, no adenopathy  Lungs: Clear to auscultation bilaterally, respirations unlabored   Heart: Regular rate and rhythm, S1 and S2 normal, no murmur, rub or gallop   Abdomen:  Soft, non-tender, bowels sounds present  Genitalia, Breasts, Rectal: Not indicated for Orthopaedic exam  CNS:  Oriented X 3, Cranial nerves II- XII grossly intact     Assessment/Plan: Right CTS  Past Medical History  Diagnosis Date  . Anxiety   . HYPERTENSION     under control; has been  on med. x 15 yrs.  . Seasonal allergies   . Arthritis     knee, c-spine  . Diabetes mellitus     NIDDM  . GERD   . Carpal tunnel syndrome of right wrist 10/2011    Plan for R CTR   PETRARCA,BRIAN 11/24/2011, 1:27 PM   agree

## 2011-11-24 NOTE — Transfer of Care (Signed)
Immediate Anesthesia Transfer of Care Note  Patient: Gina Jefferson  Procedure(s) Performed:  CARPAL TUNNEL RELEASE - CARPAL TUNNEL REL ON RIGHT   Patient Location: PACU  Anesthesia Type: General  Level of Consciousness: awake, oriented and patient cooperative  Airway & Oxygen Therapy: Patient Spontanous Breathing and Patient connected to face mask oxygen  Post-op Assessment: Report given to PACU RN, Post -op Vital signs reviewed and stable and Patient moving all extremities  Post vital signs: Reviewed and stable  Complications: No apparent anesthesia complications

## 2011-11-24 NOTE — Interval H&P Note (Signed)
History and Physical Interval Note:  11/24/2011 1:35 PM  Gina Jefferson  has presented today for surgery, with the diagnosis of C.T.S. ON RIGHT   The various methods of treatment have been discussed with the patient and family. After consideration of risks, benefits and other options for treatment, the patient has consented to  Procedure(s): CARPAL TUNNEL RELEASE as a surgical intervention .  The patients' history has been reviewed, patient examined, no change in status, stable for surgery.  I have reviewed the patients' chart and labs.  Questions were answered to the patient's satisfaction.     Norlene Campbell W

## 2011-11-24 NOTE — Anesthesia Postprocedure Evaluation (Signed)
Anesthesia Post Note  Patient: Gina Jefferson  Procedure(s) Performed:  CARPAL TUNNEL RELEASE - CARPAL TUNNEL REL ON RIGHT   Anesthesia type: General  Patient location: PACU  Post pain: Pain level controlled  Post assessment: Patient's Cardiovascular Status Stable  Last Vitals:  Filed Vitals:   11/24/11 1545  BP: 128/80  Pulse: 86  Temp:   Resp: 17    Post vital signs: Reviewed and stable  Level of consciousness: alert  Complications: No apparent anesthesia complications

## 2011-11-24 NOTE — Addendum Note (Signed)
Addendum  created 11/24/11 1637 by Erling Conte Son Barkan   Modules edited:Anesthesia Medication Administration

## 2011-11-24 NOTE — Anesthesia Preprocedure Evaluation (Addendum)
Anesthesia Evaluation  Patient identified by MRN, date of birth, ID band Patient awake    Reviewed: Allergy & Precautions, H&P , NPO status , Patient's Chart, lab work & pertinent test results, reviewed documented beta blocker date and time   Airway Mallampati: II TM Distance: >3 FB Neck ROM: full    Dental   Pulmonary neg pulmonary ROS,          Cardiovascular hypertension,     Neuro/Psych PSYCHIATRIC DISORDERS Negative Neurological ROS     GI/Hepatic negative GI ROS, Neg liver ROS, GERD-  Medicated and Controlled,  Endo/Other  Diabetes mellitus-, Well Controlled, Type 2  Renal/GU negative Renal ROS  Genitourinary negative   Musculoskeletal   Abdominal   Peds  Hematology negative hematology ROS (+)   Anesthesia Other Findings See surgeon's H&P   Reproductive/Obstetrics negative OB ROS                           Anesthesia Physical Anesthesia Plan  ASA: II  Anesthesia Plan: General   Post-op Pain Management:    Induction: Intravenous  Airway Management Planned: LMA  Additional Equipment:   Intra-op Plan:   Post-operative Plan: Extubation in OR  Informed Consent: I have reviewed the patients History and Physical, chart, labs and discussed the procedure including the risks, benefits and alternatives for the proposed anesthesia with the patient or authorized representative who has indicated his/her understanding and acceptance.   Dental Advisory Given  Plan Discussed with: CRNA and Surgeon  Anesthesia Plan Comments:        Anesthesia Quick Evaluation

## 2011-11-25 ENCOUNTER — Ambulatory Visit: Payer: 59 | Admitting: Internal Medicine

## 2011-11-25 NOTE — Op Note (Signed)
Gina Jefferson, Gina Jefferson                 ACCOUNT NO.:  [id number]  MEDICAL RECORD NO.:  [id number]  LOCATION:                                 FACILITY:  PHYSICIAN:  Claude Manges. Cleophas Dunker, M.D.    DATE OF BIRTH:  DATE OF PROCEDURE:  11/24/2011 DATE OF DISCHARGE:                              OPERATIVE REPORT   PREOPERATIVE DIAGNOSIS:  Right carpal tunnel syndrome.  POSTOPERATIVE DIAGNOSIS:  Right carpal tunnel syndrome.  PROCEDURE:  Release of volar carpal ligament of right wrist and decompression of median nerve.  SURGEON:  Claude Manges. Cleophas Dunker, M.D.  ASSISTANT:  Arlys John D. Petrarca, PA-C.  ANESTHESIA:  General.  COMPLICATIONS:  None.  PROCEDURE:  Ms. [name] was met with her husband in the holding area.  We identified the right hand as appropriate operative site and signed of the wrist.  The patient was then transported to room #5 and placed under general anesthesia without difficulty.  Tourniquet was applied to the right upper extremity.  The right hand was prepped from the tips of the fingers to the elbow with the DuraPrep.  Sterile draping was performed. With the extremity elevated, the Esmarch was exsanguinated with a proximal tourniquet at 250 mmHg.  The skin incision was outlined with a marking pencil along the longitudinal palmar crease.  An incision was made about an inch and a quarter in length via sharp dissection, carried down to subcutaneous tissue.  Fatty-tissue was then separated radially and ulnarly.  The fibers of the palmaris longus were identified and carefully incised longitudinally.  The distal extent of the volar carpal ligament was identified.  A Glorious Peach was placed beneath that and I carefully incised the volar carpal ligament along its entire length.  It was tight.  There was a well-developed palmaris brevis muscle.  The fibers were debrided radially and I released the volar carpal ligament and muscle fascia along its ulnar border, again protecting the nerve with a  Freer.  Under direct visualization, using a __________ retractor, I could see the proximal extent of the volar carpal ligament that was nicely decompressed.  The nerve was pale. There  was an area of hourglass constriction near its midportion and after about 5 minutes, the nerve actually appeared to be more vascular.  The recurrent motor branch was intact.  It did not see evidence of flexor tenosynovitis.  The wound was then irrigated with saline solution.  The skin was closed with interrupted 4-0 Ethibond.  Xylocaine 1% without epinephrine was injected in the skin edges.  Sterile bulky dressing was applied followed by posterior splint and the Ace bandage.  The tourniquet was deflated at 28 minutes with immediate capillary refill to the fingers.  Patient tolerated the procedure without complications.  PLAN:  Hydrocodone for pain.  Office in 1 week.     Claude Manges. Cleophas Dunker, M.D.     PWW/MEDQ  D:  11/24/2011  T:  11/25/2011  Job:  [id number]

## 2011-11-28 ENCOUNTER — Encounter (HOSPITAL_BASED_OUTPATIENT_CLINIC_OR_DEPARTMENT_OTHER): Payer: Self-pay | Admitting: Orthopaedic Surgery

## 2011-12-14 ENCOUNTER — Ambulatory Visit: Payer: 59 | Admitting: Internal Medicine

## 2011-12-14 ENCOUNTER — Encounter: Payer: Self-pay | Admitting: Internal Medicine

## 2011-12-14 ENCOUNTER — Ambulatory Visit (INDEPENDENT_AMBULATORY_CARE_PROVIDER_SITE_OTHER): Payer: 59 | Admitting: Internal Medicine

## 2011-12-14 VITALS — BP 112/82 | HR 78 | Temp 97.7°F | Wt 153.8 lb

## 2011-12-14 DIAGNOSIS — E119 Type 2 diabetes mellitus without complications: Secondary | ICD-10-CM

## 2011-12-14 DIAGNOSIS — Z0000 Encounter for general adult medical examination without abnormal findings: Secondary | ICD-10-CM

## 2011-12-14 DIAGNOSIS — I10 Essential (primary) hypertension: Secondary | ICD-10-CM

## 2011-12-14 NOTE — Assessment & Plan Note (Signed)
The current medical regimen is effective;  continue present plan and medications. BP Readings from Last 3 Encounters:  12/14/11 112/82  11/24/11 131/89  11/24/11 131/89

## 2011-12-14 NOTE — Patient Instructions (Signed)
It was good to see you today. Test(s) ordered today. Your results will be called to you after review (48-72hours after test completion). If any changes need to be made, you will be notified at that time. Health Maintenance reviewed - everything is up to date. Medications reviewed, no changes at this time.   Please schedule followup in 6 months for diabetes mellitus check, call sooner if problems.

## 2011-12-14 NOTE — Progress Notes (Signed)
Subjective:    Patient ID: Gina Jefferson, female    DOB: 1951-08-10, 61 y.o.   MRN: [id number]  HPI patient is here today for annual physical. Patient feels well overall.  Also reviewed chronic medical issues:  hx grief rxn summer 2010 due to unexpected death of son  met with [employer] [facility]  feeling better. did not feel daily SSRI needed but pt uses "as needed"   hypertension - reports compliance with ongoing medical treatment and no changes in medication dose or frequency. denies adverse side effects related to current therapy.   dm2 - hx intol to metformin (gi upset)- reports compliance with ongoing medical treatment Alma Friendly) and no changes in medication dose or frequency. denies adverse side effects related to current therapy. does not check cbgs regualry   dyslipidemia - intol of statin tx in past - uses fish oil and otc tx - working on diet and exercise   Past Medical History  Diagnosis Date  . Anxiety   . HYPERTENSION     under control; has been on med. x 15 yrs.  . Seasonal allergies   . Arthritis     knee, c-spine  . Diabetes mellitus     NIDDM  . GERD   . Carpal tunnel syndrome of right wrist 10/2011    s/p surgical release   Family History  Problem Relation Age of Onset  . Diabetes Mother   . Stroke Father   . Alzheimer's disease Father   . Cancer Maternal Grandfather     stomach cancer   History  Substance Use Topics  . Smoking status: Former Smoker    Types: Cigarettes    Quit date: 11/29/2003  . Smokeless tobacco: Never Used  . Alcohol Use: Yes     occasionally    Review of Systems  Cardiovascular: Negative for chest pain and palpitations.  Neurological: Negative for dizziness and headaches.  Constitutional: Negative for fever or unexpected weight change.  Respiratory: Negative for cough and shortness of breath.   Gastrointestinal: Negative for abdominal pain, no bowel changes.  Musculoskeletal: Negative for gait problem or joint swelling.   Skin: Negative for rash.  No other specific complaints in a complete review of systems (except as listed in HPI above).      Objective:   Physical Exam  BP 112/82  Pulse 78  Temp(Src) 97.7 F (36.5 C) (Oral)  Wt 153 lb 12.8 oz (69.763 kg)  SpO2 97% Wt Readings from Last 3 Encounters:  12/14/11 153 lb 12.8 oz (69.763 kg)  11/17/11 155 lb (70.308 kg)  11/17/11 155 lb (70.308 kg)   Constitutional: She appears well-developed and well-nourished. No distress.  HENT: Head: Normocephalic and atraumatic. Ears: B TMs ok, no erythema or effusion; Nose: Nose normal.  Mouth/Throat: Oropharynx is clear and moist. No oropharyngeal exudate.  Eyes: Conjunctivae and EOM are normal. Pupils are equal, round, and reactive to light. No scleral icterus.  Neck: Normal range of motion. Neck supple. No JVD present. No thyromegaly present.  Cardiovascular: Normal rate, regular rhythm and normal heart sounds.  No murmur heard. No BLE edema. Pulmonary/Chest: Effort normal and breath sounds normal. No respiratory distress. She has no wheezes.  Abdominal: Soft. Bowel sounds are normal. She exhibits no distension. There is no tenderness. no masses Musculoskeletal: Normal range of motion, no joint effusions. No gross deformities Neurological: She is alert and oriented to person, place, and time. No cranial nerve deficit. Coordination normal.  Skin: Skin is warm and dry. No  rash noted. No erythema.  Psychiatric: She has a normal mood and affect. Her behavior is normal. Judgment and thought content normal.   Lab Results  Component Value Date   WBC 11.3* 06/23/2009   HGB 13.7 11/24/2011   HCT 38.8 06/23/2009   PLT 222.0 06/23/2009   CHOL 247* 07/10/2009   TRIG 101.0 07/10/2009   HDL 42.50 07/10/2009   LDLDIRECT 177.8 07/10/2009   ALT 26 01/14/2011   AST 27 01/14/2011   NA 142 11/23/2011   K 3.6 11/23/2011   CL 106 11/23/2011   CREATININE 0.76 11/23/2011   BUN 10 11/23/2011   CO2 27 11/23/2011   TSH 0.923  07/17/2009   HGBA1C 5.9 07/22/2011        Assessment & Plan:  CPX - v70.0 - Patient has been counseled on age-appropriate routine health concerns for screening and prevention. These are reviewed and up-to-date. Immunizations are up-to-date or declined. Labs ordered and will be reviewed.  Also see problem list. Medications and labs reviewed today.

## 2011-12-14 NOTE — Assessment & Plan Note (Signed)
on Januvia + ARB for same Check a1c now and adjust as needed The current medical regimen is effective;  continue present plan and medications.   Lab Results  Component Value Date   HGBA1C 5.9 07/22/2011

## 2011-12-19 ENCOUNTER — Other Ambulatory Visit (INDEPENDENT_AMBULATORY_CARE_PROVIDER_SITE_OTHER): Payer: 59

## 2011-12-19 DIAGNOSIS — E119 Type 2 diabetes mellitus without complications: Secondary | ICD-10-CM

## 2011-12-19 DIAGNOSIS — Z0000 Encounter for general adult medical examination without abnormal findings: Secondary | ICD-10-CM

## 2011-12-19 LAB — CBC WITH DIFFERENTIAL/PLATELET
Basophils Absolute: 0 10*3/uL (ref 0.0–0.1)
HCT: 37.7 % (ref 36.0–46.0)
Hemoglobin: 12.8 g/dL (ref 12.0–15.0)
Lymphs Abs: 2.1 10*3/uL (ref 0.7–4.0)
MCV: 94.4 fl (ref 78.0–100.0)
Monocytes Absolute: 0.6 10*3/uL (ref 0.1–1.0)
Monocytes Relative: 10.1 % (ref 3.0–12.0)
Neutro Abs: 2.5 10*3/uL (ref 1.4–7.7)
Platelets: 241 10*3/uL (ref 150.0–400.0)
RDW: 13.4 % (ref 11.5–14.6)

## 2011-12-19 LAB — BASIC METABOLIC PANEL
BUN: 13 mg/dL (ref 6–23)
CO2: 28 mEq/L (ref 19–32)
Chloride: 107 mEq/L (ref 96–112)
GFR: 115.48 mL/min (ref >60.00)
Glucose, Bld: 90 mg/dL (ref 70–99)
Potassium: 3.8 mEq/L (ref 3.5–5.1)
Sodium: 142 mEq/L (ref 135–145)

## 2011-12-19 LAB — HEMOGLOBIN A1C: Hgb A1c MFr Bld: 5.9 % (ref 4.6–6.5)

## 2011-12-19 LAB — HEPATIC FUNCTION PANEL
Bilirubin, Direct: 0.1 mg/dL (ref 0.0–0.3)
Total Bilirubin: 0.7 mg/dL (ref 0.3–1.2)

## 2011-12-19 LAB — LIPID PANEL
HDL: 52.1 mg/dL (ref >39.00)
Total CHOL/HDL Ratio: 5
Triglycerides: 96 mg/dL (ref 0.0–149.0)
VLDL: 19.2 mg/dL (ref 0.0–40.0)

## 2011-12-19 LAB — URINALYSIS
Bilirubin Urine: NEGATIVE
Hgb urine dipstick: NEGATIVE
Ketones, ur: NEGATIVE
Leukocytes, UA: NEGATIVE
Total Protein, Urine: NEGATIVE
pH: 7.5 (ref 5.0–8.0)

## 2011-12-21 ENCOUNTER — Other Ambulatory Visit: Payer: Self-pay | Admitting: Internal Medicine

## 2011-12-21 MED ORDER — COLESEVELAM HCL 625 MG PO TABS: 1875.0000 mg | ORAL_TABLET | Freq: Two times a day (BID) | ORAL | Status: DC

## 2012-01-09 ENCOUNTER — Telehealth: Payer: Self-pay

## 2012-01-09 NOTE — Telephone Encounter (Signed)
Ok to [employer] for now - use miralax or MOM for bowels - thanks

## 2012-01-09 NOTE — Telephone Encounter (Signed)
Pt called stating Welchol has caused severe constipation, pt has not had a BM in 5 days. Pt says she is going to discontinue medication and is requesting further instruction from MD, please advise.

## 2012-01-10 NOTE — Telephone Encounter (Signed)
Pt advise of MD's recommendations.

## 2012-02-08 ENCOUNTER — Other Ambulatory Visit: Payer: Self-pay | Admitting: *Deleted

## 2012-02-08 DIAGNOSIS — R490 Dysphonia: Secondary | ICD-10-CM

## 2012-02-08 MED ORDER — ZOLPIDEM TARTRATE 10 MG PO TABS: 10.0000 mg | ORAL_TABLET | Freq: Every evening | ORAL | Status: DC | PRN

## 2012-02-08 MED ORDER — ESOMEPRAZOLE MAGNESIUM 40 MG PO CPDR: 40.0000 mg | DELAYED_RELEASE_CAPSULE | Freq: Every day | ORAL | Status: DC

## 2012-02-08 NOTE — Telephone Encounter (Signed)
Faxed script back to [facility]... 02/08/12@2 :34pm/LMB

## 2012-04-11 ENCOUNTER — Ambulatory Visit: Payer: 59 | Admitting: *Deleted

## 2012-04-28 ENCOUNTER — Ambulatory Visit: Payer: 59

## 2012-05-01 ENCOUNTER — Encounter: Payer: Self-pay | Admitting: *Deleted

## 2012-05-01 ENCOUNTER — Encounter: Payer: 59 | Attending: Internal Medicine | Admitting: *Deleted

## 2012-05-01 VITALS — Ht 61.0 in | Wt 150.4 lb

## 2012-05-01 DIAGNOSIS — E785 Hyperlipidemia, unspecified: Secondary | ICD-10-CM | POA: Insufficient documentation

## 2012-05-01 DIAGNOSIS — Z713 Dietary counseling and surveillance: Secondary | ICD-10-CM | POA: Insufficient documentation

## 2012-05-01 IMAGING — MR MR CERVICAL SPINE W/O CM
5 of 6 series · 18 of 48 positions shown · non-contrast
Comparison: None.

CLINICAL DATA: Right neck and arm pain.  Right arm numbness.

MRI CERVICAL SPINE WITHOUT CONTRAST
TECHNIQUE: Multiplanar and multiecho pulse sequences of the
cervical spine, to include the craniocervical junction and
cervicothoracic junction, were obtained according to standard
protocol without intravenous contrast.

[Series 2: T1 · sagittal · 3.0mm · 0.43mm/px · 2 of 12 slices shown]
[im 1/12]
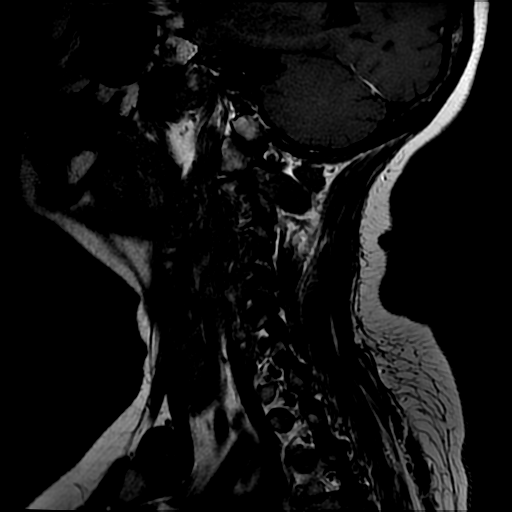
[im 12/12]
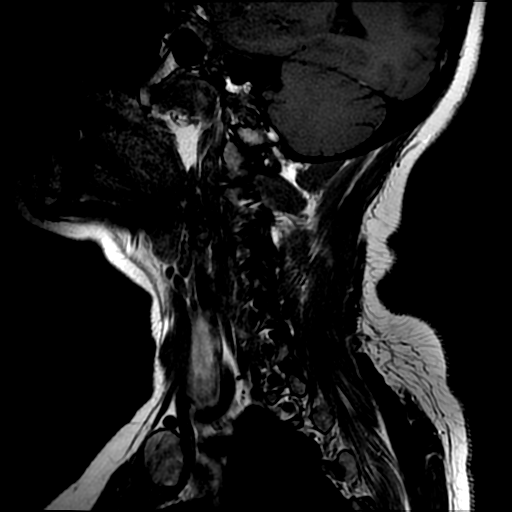

[Series 3: T2 · sagittal · 3.0mm · 0.43mm/px · 3 of 12 slices shown (1 of 2)]
[im 1/12]
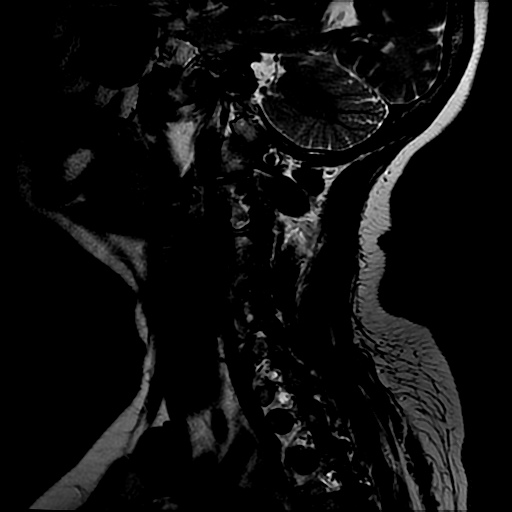
[im 6/12]
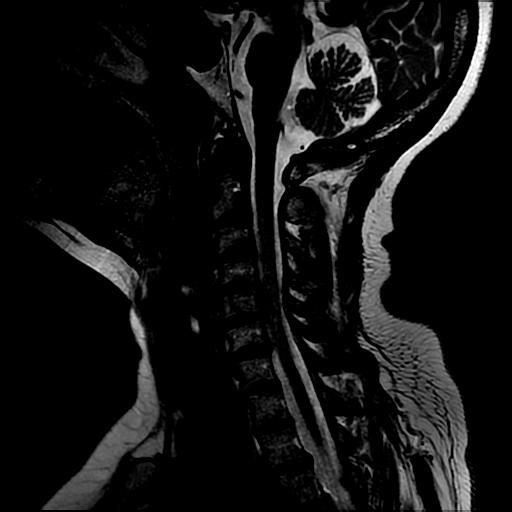
[im 12/12]
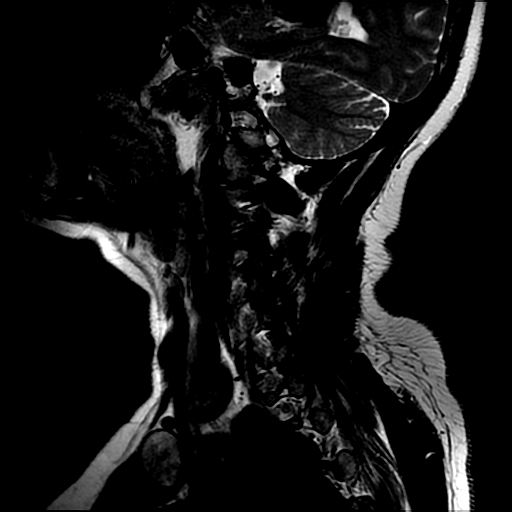

[Series 4: sag ir · sagittal · 3.0mm · 0.43mm/px · 3 of 12 slices shown]
[im 1/12]
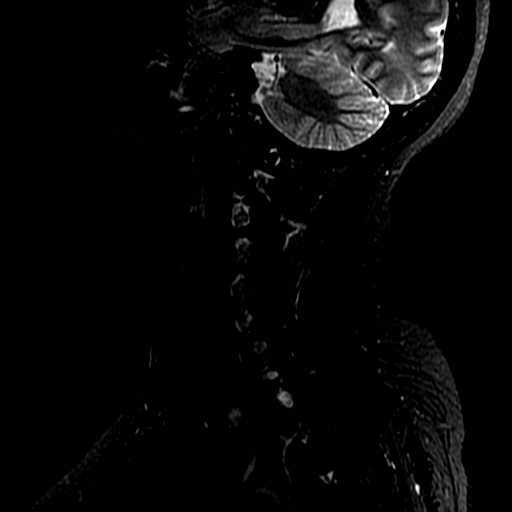
[im 6/12]
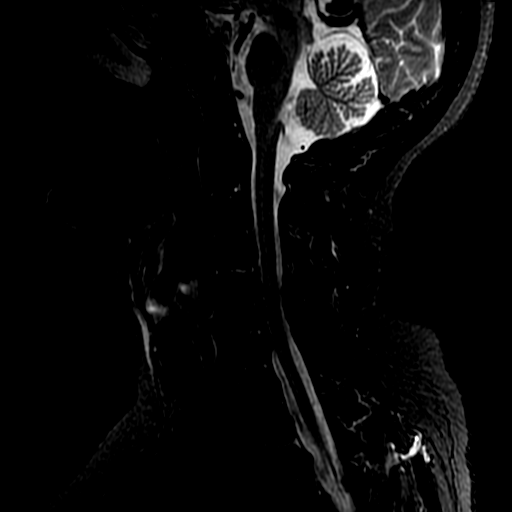
[im 12/12]
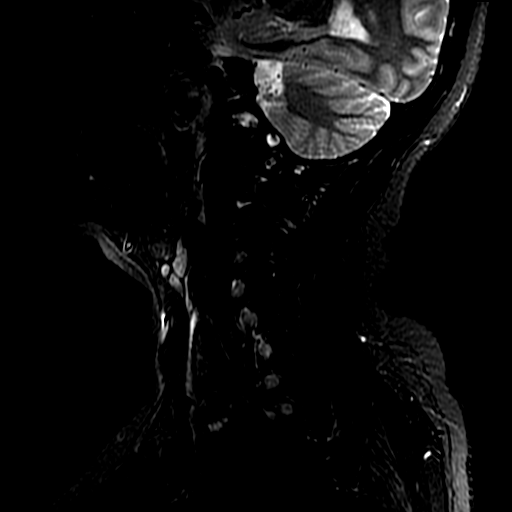

[Series 5: ax 2d merge · axial · 4.0mm · 0.35mm/px · z∈[-54,+30]mm · 5 of 21 slices shown]
[im 1/21]
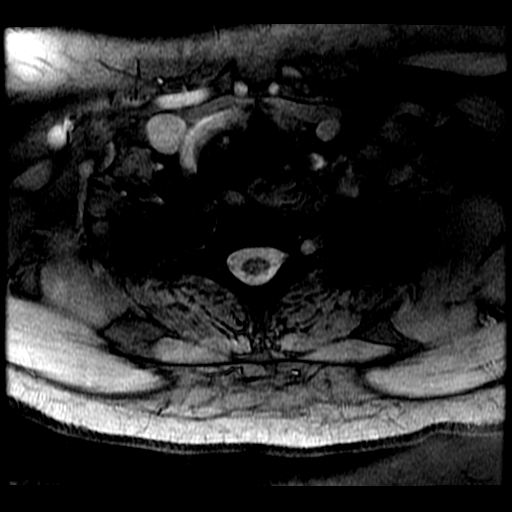
[im 6/21]
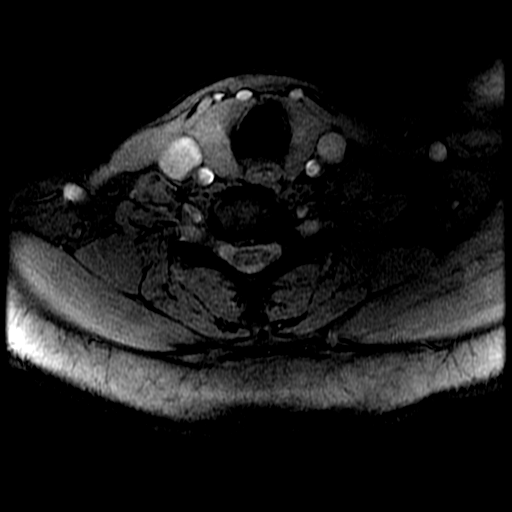
[im 11/21]
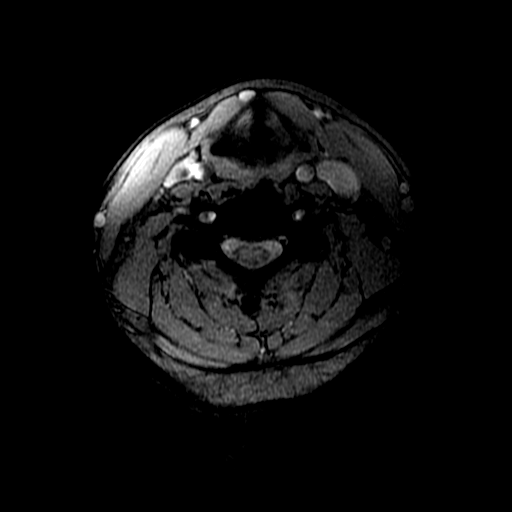
[im 16/21]
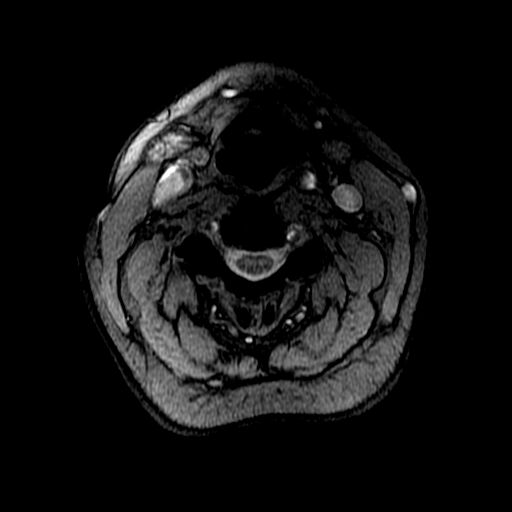
[im 21/21]
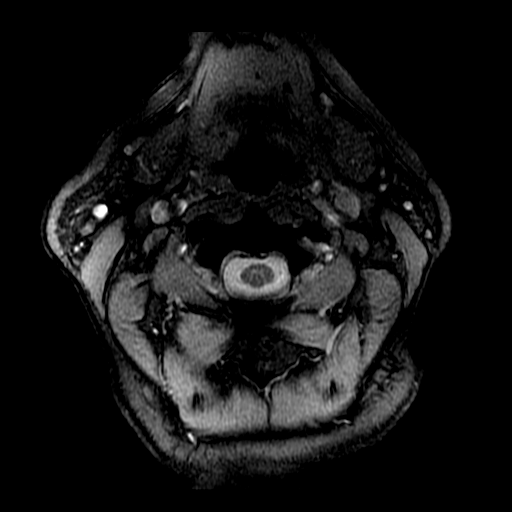

[Series 6: T2 · axial · 4.0mm · 0.35mm/px · z∈[-54,+30]mm · 5 of 21 slices shown (2 of 2)]
[im 1/21]
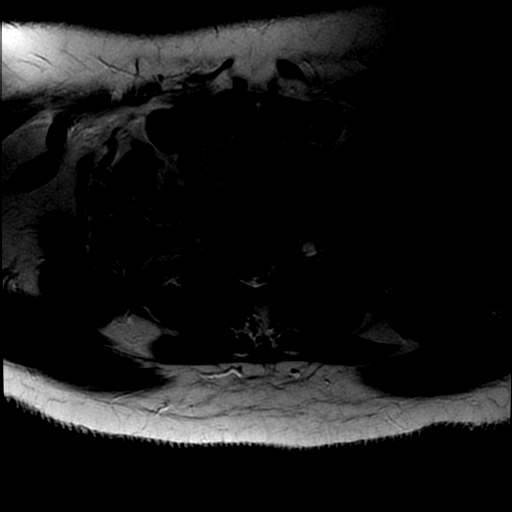
[im 6/21]
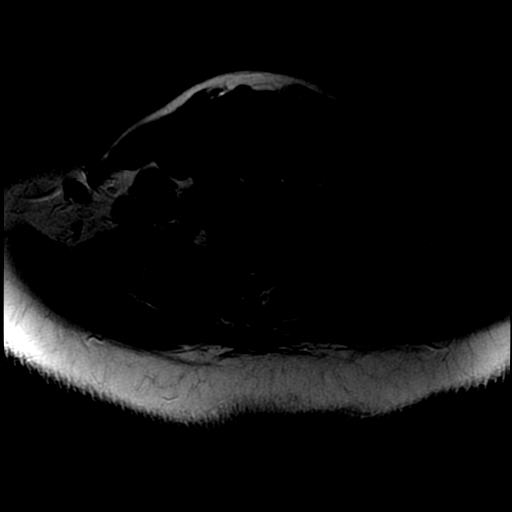
[im 11/21]
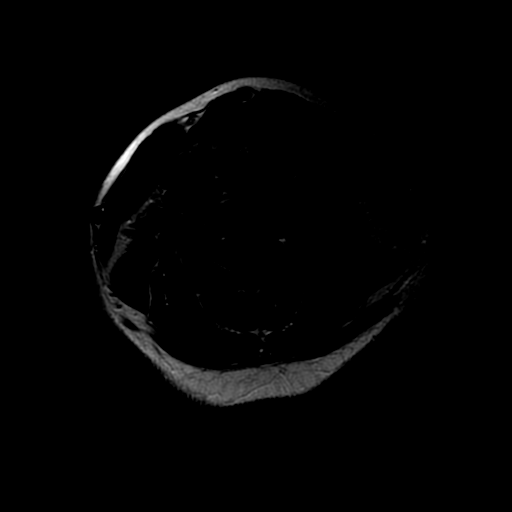
[im 16/21]
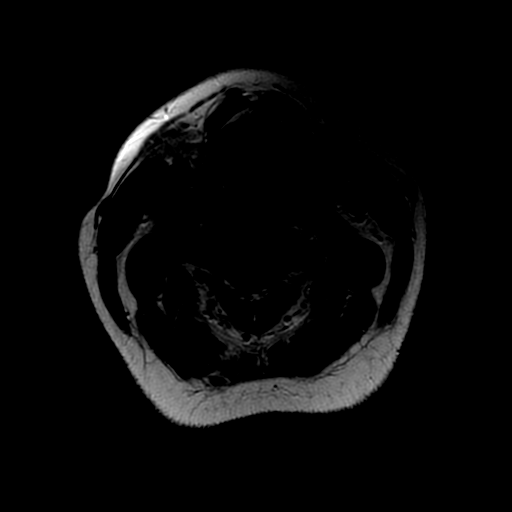
[im 21/21]
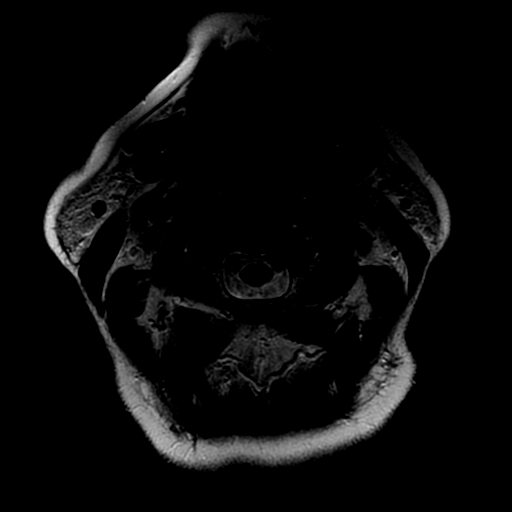

[18 of 48 positions shown; findings below may reference images not displayed]

FINDINGS: The craniocervical junction appears unremarkable.

No significant vertebral subluxation.

Inversion recovery weighted images demonstrate no significant
abnormal vertebral or periligamentous edema.

No significant abnormal cord signal is identified.

Additional findings at individual levels are as follows:

C2-3:  Unremarkable.

C3-4:  Disc bulge noted with right uncinate spurring and facet
arthropathy.  Mild right foraminal stenosis noted. The AP diameter
of the thecal sac is 10 mm, borderline for central stenosis.

C4-5:  The diffuse disc bulge noted with uncinate spurring. The AP
diameter of the thecal sac is 8 mm compatible with moderate central
stenosis.  There is moderate left foraminal stenosis and moderate
to prominent right foraminal stenosis.

C5-6:  Disc osteophyte complex and uncinate spurring noted, with
some focal left paracentral component. The AP diameter of the
thecal sac is narrowed to 7 mm compatible with moderate central
stenosis.  There is prominent left foraminal stenosis.

C6-7:  Disc osteophyte complex noted with right paracentral
component. The AP diameter of the thecal sac is 9 mm, compatible
with mild central stenosis.
Cysts

C7-T1:  Left facet overgrowth noted with borderline left foraminal
stenosis.

T1-2:  Sagittal images demonstrate lateral recess and
intervertebral spurring along with a left perineural cyst.

T2-3:  Central and right paracentral disc protrusion noted without
impingement.
IMPRESSION: 1.  Cervical spondylosis and degenerative disc disease with varying
degrees of impingement at all levels between C3 and C7.

## 2012-05-01 NOTE — Patient Instructions (Signed)
Laughing cow cheese spreads, or cheeses made from reduced fat milk Always have lean protein with meals: egg white, low fat cheese, yogurt, handful of nuts (almonds or walnuts) Increase whole grains: cheerios or oatmeal; whole wheat breads; brown rice Follow MyPlate guidelines for increasing vegetables Add weight-bearing exercise 3 days a week

## 2012-05-01 NOTE — Progress Notes (Signed)
Medical Nutrition Therapy:  Appt start time: 1500 end time:  1530.   Assessment:  Primary concerns today: hyperlipidemia.   MEDICATIONS: see list   DIETARY INTAKE:  Usual eating pattern includes 3 meals and 1-2 snacks per day.   24-hr recall:  B ( AM): 4 oz oj, rice krispies, corn flakes, trix cereal with skim milk; 2 pieces bacon, 1 egg, toast, oj or another juice on weekends Snk ( AM): yogurt or fig newton or fruit; water L ( PM): salad: lettuce, salad additions from lean cusine; lunch brought in at work: salad with baked skinless chicken Snk ( PM): none D ( PM): however many calories are left from my fitness pal: popcorn or sandwich Snk ( PM): none Beverages: water; some juice  Usual physical activity: 30 min Wii Fit daily Consumes approximately 1270 calories per My fitness pal recommendations  Estimated energy needs: 1200 calories 135 g carbohydrates 90 g protein 33 g fat  Progress Towards Goal(s):  Some progress.   Nutritional Diagnosis:  NI-5.6.2 Excessive fat intake related to unhealthy food choices.  As evidenced by total cholesterol of 268mg /dl.    Intervention:  Nutrition counseling provided.  Patient had lipid panel in January and was found to have hyperlipidemia.  Since then she has made many lifestyle changes.  She is physically active every day, reduced her dietary fat and caloric intake, increased water, and fruits/vegetables.  Follows very lean meal plan.  Encouraged more vegetables versus fruits, plant-based proteins, more whole grains, and weight bearing exercises.  Handouts given during visit include:  High-cholesterol diet tips  Goals: Try Laughing cow cheese spreads, or cheeses made from reduced fat milk Always have lean protein with meals: egg white, low fat cheese, yogurt, handful of nuts (almonds or walnuts) Increase whole grains: cheerios or oatmeal; whole wheat breads; brown rice Follow MyPlate guidelines for increasing vegetables Add  weight-bearing exercise 3 days a week  Monitoring/Evaluation:  Dietary intake, exercise, and body weight prn.  Patient will make follow-up appointment as needed.  Patient will also attend Core 1 class in July for her diabetes management.

## 2012-05-17 ENCOUNTER — Other Ambulatory Visit: Payer: Self-pay | Admitting: Internal Medicine

## 2012-06-09 ENCOUNTER — Encounter: Payer: Self-pay | Admitting: Dietician

## 2012-06-09 ENCOUNTER — Encounter: Payer: 59 | Attending: Internal Medicine | Admitting: Dietician

## 2012-06-09 DIAGNOSIS — Z713 Dietary counseling and surveillance: Secondary | ICD-10-CM | POA: Insufficient documentation

## 2012-06-09 DIAGNOSIS — E785 Hyperlipidemia, unspecified: Secondary | ICD-10-CM | POA: Insufficient documentation

## 2012-06-09 NOTE — Progress Notes (Signed)
Patient was seen on 06/09/2012 for the complete series of three diabetes self-management courses at the [facility].  Current A1c = 5.9 % The following learning objectives were met by the patient during this course:  Core 1:  Gain an understanding of diabetes and what causes it  Learn how diabetes is treated and the goals of treatment  Learn why, how and when to test BG  Learn how carbohydrate affects your glucose  Receive a personal food plan and learn how to count carbohydrates  Discover how physical activity enhances glucose control and overall health  Begin to gain confidence in your ability to manage diabetes  Handouts given during this class include:  Type 2 Diabetes: Basics Book  My Food Plan Book  Food and Activity Log  Core 2:  Describe causes, symptoms and treatment of hypoglycemia and hyperglycemia  Learn how to care for your glucose meter and strips  Explain how to manage diabetes during illness  List strategies to follow meal plan when dining out  Describe the effects of alcohol on glucose and how to use it safely  Describe problem solving skills for day-to-day glucose challenges  Describe ways to remain physically active  Describe the impact of regular activity on insulin resistance  Handouts given in this class:  Refrigerator magnet for Sick Day Guidelines  [facility] Oral Medication and Insulin handout  Core 3  Describe how diabetes changes over time  Understand why glucose may be out of target  Learn how diabetes changes over time  Learn about blood pressure, cholesterol, and heart health  Learn about lowering dietary fat and sodium  Understand the benefits of physical activity for heart health  Develop problem solving skills for times when glucose numbers are puzzling  Develop strategies for creating life balance  Learn how to identify if your treatment plan needs to change  Develop strategies for dealing  with stress, depression and staying motivated  Identify healthy weight-loss plans  Gain confidence that you can succeed in caring for your diabetes  Establish 2-3 goals that they will plan to diligently work on until they return                   for the free 7-month follow-up visit   The following handouts were given in class:  3 Month Follow Up Visit handout  Goal setting handout  Class evaluation form  Your patient has established the following 3 month goals for diabetes self-care:  Count carbs at most of my meals  Be active 30 minutes or more 5 days a week  Follow-Up Plan: Patient was offered a 3 month follow-up visit for diabetes self-management education.

## 2012-06-09 NOTE — Progress Notes (Deleted)
  Medical Nutrition Therapy:  Appt start time: {Time; Appointment:21385} end time:  {Time; Appointment:21385}.   Assessment:  Primary concerns today: ***.   MEDICATIONS: ***   DIETARY INTAKE:  Usual eating pattern includes *** meals and *** snacks per day.  Everyday foods include ***.  Avoided foods include ***.    24-hr recall:  B ( AM): ***  Snk ( AM): ***  L ( PM): *** Snk ( PM): *** D ( PM): *** Snk ( PM): *** Beverages: ***  Usual physical activity: ***  Estimated energy needs: *** calories *** g carbohydrates *** g protein *** g fat  Progress Towards Goal(s):  {Desc; Goals Progress:21388}.   Nutritional Diagnosis:  {CHL AMB NUTRITIONAL DIAGNOSIS:281 [id number]}    Intervention:  Nutrition ***.  Handouts given during visit include:  ***  ***  Monitoring/Evaluation:  Dietary intake, exercise, ***, and body weight {follow up:15908}.

## 2012-06-09 NOTE — Patient Instructions (Signed)
Goals:  Follow Diabetes Meal Plan as instructed  Eat 3 meals and 2 snacks, every 3-5 hrs  Limit carbohydrate intake to 30-45 grams carbohydrate/meal  Limit carbohydrate intake to 0-15 grams carbohydrate/snack  Add lean protein foods to meals/snacks  Monitor glucose levels as instructed by your doctor  Aim for 15-30 mins of physical activity daily as tolerated  Bring food record and glucose log to your next nutrition visit 

## 2012-06-14 ENCOUNTER — Encounter: Payer: Self-pay | Admitting: Internal Medicine

## 2012-06-14 ENCOUNTER — Ambulatory Visit (INDEPENDENT_AMBULATORY_CARE_PROVIDER_SITE_OTHER): Payer: 59 | Admitting: Internal Medicine

## 2012-06-14 ENCOUNTER — Ambulatory Visit (INDEPENDENT_AMBULATORY_CARE_PROVIDER_SITE_OTHER)
Admission: RE | Admit: 2012-06-14 | Discharge: 2012-06-14 | Disposition: A | Discharge status: Home / Self Care | Payer: 59 | Source: Ambulatory Visit

## 2012-06-14 ENCOUNTER — Other Ambulatory Visit (INDEPENDENT_AMBULATORY_CARE_PROVIDER_SITE_OTHER): Payer: 59

## 2012-06-14 VITALS — BP 120/78 | HR 90 | Temp 98.5°F | Ht 62.0 in | Wt 148.1 lb

## 2012-06-14 DIAGNOSIS — Z780 Asymptomatic menopausal state: Secondary | ICD-10-CM

## 2012-06-14 DIAGNOSIS — E785 Hyperlipidemia, unspecified: Secondary | ICD-10-CM

## 2012-06-14 DIAGNOSIS — E119 Type 2 diabetes mellitus without complications: Secondary | ICD-10-CM

## 2012-06-14 DIAGNOSIS — I10 Essential (primary) hypertension: Secondary | ICD-10-CM

## 2012-06-14 LAB — HEMOGLOBIN A1C: Hgb A1c MFr Bld: 6 % (ref 4.6–6.5)

## 2012-06-14 LAB — LIPID PANEL: HDL: 55.2 mg/dL (ref >39.00)

## 2012-06-14 LAB — LDL CHOLESTEROL, DIRECT: Direct LDL: 236.8 mg/dL

## 2012-06-14 MED ORDER — ZOLPIDEM TARTRATE 10 MG PO TABS: 10.0000 mg | ORAL_TABLET | Freq: Every evening | ORAL | Status: DC | PRN

## 2012-06-14 NOTE — Assessment & Plan Note (Signed)
Intol of statin side effects (myalgias) Trial welchol 12/2011 caused severe constipation S/p nutrition counseling on same summer 2013 The patient is asked to make an attempt to improve diet and exercise patterns to aid in medical management of this problem.   

## 2012-06-14 NOTE — Assessment & Plan Note (Signed)
The current medical regimen is effective;  continue present plan and medications. BP Readings from Last 3 Encounters:  06/14/12 120/78  12/14/11 112/82  11/24/11 131/89

## 2012-06-14 NOTE — Progress Notes (Signed)
  Subjective:    Patient ID: Gina Jefferson, female    DOB: [birth year], 61 y.o.   MRN: [id number]  HPI patient is here for follow up - reviewed chronic medical issues:  hx grief rxn summer 2010 due to unexpected death of son  met with susan bond for counseling  feeling better. did not feel daily SSRI needed but pt uses "as needed"   hypertension - reports compliance with ongoing medical treatment and no changes in medication dose or frequency. denies adverse side effects related to current therapy.   dm2 - hx intol to metformin (gi upset)- reports compliance with ongoing medical treatment [facility]) and no changes in medication dose or frequency. denies adverse side effects related to current therapy. does not check cbgs regualry   dyslipidemia - intol of statin tx in past, trial welchol 12/2011 caused constipation side effects, ?fewer pills - uses fish oil and otc tx - working on diet and exercise, nutrition counseling summer 2013 x 2  Past Medical History  Diagnosis Date  . Anxiety   . HYPERTENSION     under control; has been on med. x 15 yrs.  . Seasonal allergies   . Arthritis     knee, c-spine  . Diabetes mellitus, type 2     NIDDM  . GERD   . Carpal tunnel syndrome of right wrist 10/2011    s/p surgical release  . Hyperlipidemia      Review of Systems  Cardiovascular: Negative for chest pain and palpitations.  Neurological: Negative for dizziness and headaches.       Objective:   Physical Exam  BP 120/78  Pulse 90  Temp 98.5 F (36.9 C) (Oral)  Ht 5\' 2"  (1.575 m)  Wt 148 lb 1.9 oz (67.187 kg)  BMI 27.09 kg/m2  SpO2 95% Wt Readings from Last 3 Encounters:  06/14/12 148 lb 1.9 oz (67.187 kg)  06/09/12 150 lb 8 oz (68.266 kg)  05/01/12 150 lb 6.4 oz (68.221 kg)   Constitutional: She appears well-developed and well-nourished. No distress.   Neck: Normal range of motion. Neck supple. No JVD present. No thyromegaly present.  Cardiovascular: Normal rate, regular  rhythm and normal heart sounds.  No murmur heard. No BLE edema. Pulmonary/Chest: Effort normal and breath sounds normal. No respiratory distress. She has no wheezes.  Psychiatric: She has a normal mood and affect. Her behavior is normal. Judgment and thought content normal.   Lab Results  Component Value Date   WBC 5.5 12/19/2011   HGB 12.8 12/19/2011   HCT 37.7 12/19/2011   PLT 241.0 12/19/2011   CHOL 268* 12/19/2011   TRIG 96.0 12/19/2011   HDL 52.10 12/19/2011   LDLDIRECT 198.9 12/19/2011   ALT 28 12/19/2011   AST 26 12/19/2011   NA 142 12/19/2011   K 3.8 12/19/2011   CL 107 12/19/2011   CREATININE 0.7 12/19/2011   BUN 13 12/19/2011   CO2 28 12/19/2011   TSH 0.67 12/19/2011   HGBA1C 5.9 12/19/2011        Assessment & Plan:   see problem list. Medications and labs reviewed today.

## 2012-06-14 NOTE — Assessment & Plan Note (Signed)
S/p complete BSO/TAH in her 35s HRT x 10years - stopped same 2008 Takes black cohash for hot flash symptoms  Check bone density screening now

## 2012-06-14 NOTE — Assessment & Plan Note (Signed)
on Januvia + ARB for same, intol of statin/welchol Check a1c now and adjust as needed The patient is asked to continue ongoing attempt to improve diet and exercise patterns to aid in medical management of this problem. The current medical regimen is effective;  continue present plan and medications.   Lab Results  Component Value Date   HGBA1C 5.9 12/19/2011

## 2012-06-14 NOTE — Patient Instructions (Signed)
It was good to see you today. Medications reviewed, no changes - Refill on medication(s) as discussed today.  Test(s) ordered today. Your results will be called to you after review (48-72hours after test completion). If any changes need to be made, you will be notified at that time. We'll schedule your bone density.  Please schedule followup in 6 months for diabetes mellitus and cholesterol check, call sooner if problems. Consider shingles vaccine

## 2012-08-30 ENCOUNTER — Other Ambulatory Visit: Payer: Self-pay | Admitting: Internal Medicine

## 2012-09-18 ENCOUNTER — Encounter: Payer: 59 | Attending: Internal Medicine | Admitting: Dietician

## 2012-09-18 VITALS — Ht 62.0 in | Wt 153.9 lb

## 2012-09-18 DIAGNOSIS — E119 Type 2 diabetes mellitus without complications: Secondary | ICD-10-CM | POA: Insufficient documentation

## 2012-09-18 DIAGNOSIS — Z713 Dietary counseling and surveillance: Secondary | ICD-10-CM | POA: Insufficient documentation

## 2012-09-18 NOTE — Progress Notes (Signed)
  Patient was seen on 09/18/2012 for their 3 month follow-up as a part of the diabetes self-management courses at the [facility]. The following learning objectives were met by your patient during this course:  Patient self reports the following:  Diabetes control has improved since diabetes self-management training: Feels that her control has improved.   Number of days blood glucose is >200: Not checking blood glucose. Last MD appointment for diabetes: August A1C was at 6.0% Changes in treatment plan: no changes  Confidence with ability to manage diabetes: Feels more confident. Areas for improvement with diabetes self-care: Trying to get more activity in. Willingness to participate in diabetes support group: not right now.   Please see Diabetes Flow sheet for findings related to patient's self-care.  Follow-Up Plan: Patient is eligible for a "free" 30 minute diabetes self-care appointment in the next year. Patient to call and schedule as needed.

## 2012-10-03 ENCOUNTER — Other Ambulatory Visit: Payer: Self-pay | Admitting: Obstetrics and Gynecology

## 2012-10-03 ENCOUNTER — Other Ambulatory Visit (HOSPITAL_COMMUNITY): Payer: Self-pay | Admitting: Obstetrics and Gynecology

## 2012-10-03 DIAGNOSIS — E041 Nontoxic single thyroid nodule: Secondary | ICD-10-CM

## 2012-10-12 ENCOUNTER — Ambulatory Visit (HOSPITAL_COMMUNITY)
Admission: RE | Admit: 2012-10-12 | Discharge: 2012-10-12 | Disposition: A | Discharge status: Home / Self Care | Payer: 59 | Source: Ambulatory Visit | Attending: Obstetrics and Gynecology | Admitting: Obstetrics and Gynecology

## 2012-10-12 ENCOUNTER — Other Ambulatory Visit: Payer: Self-pay | Admitting: Obstetrics and Gynecology

## 2012-10-12 DIAGNOSIS — E041 Nontoxic single thyroid nodule: Secondary | ICD-10-CM

## 2012-10-12 DIAGNOSIS — Z0189 Encounter for other specified special examinations: Secondary | ICD-10-CM | POA: Insufficient documentation

## 2012-11-06 ENCOUNTER — Other Ambulatory Visit: Payer: Self-pay | Admitting: *Deleted

## 2012-11-06 MED ORDER — ZOLPIDEM TARTRATE 10 MG PO TABS: 10.0000 mg | ORAL_TABLET | Freq: Every evening | ORAL | Status: DC | PRN

## 2012-11-06 NOTE — Telephone Encounter (Signed)
Faxed script back to .../lmb 

## 2012-11-14 LAB — HEMOGLOBIN A1C: Hgb A1c MFr Bld: 5.6 % (ref 4.0–6.0)

## 2012-11-15 ENCOUNTER — Other Ambulatory Visit: Payer: Self-pay | Admitting: Internal Medicine

## 2012-11-16 ENCOUNTER — Other Ambulatory Visit: Payer: Self-pay | Admitting: *Deleted

## 2012-11-16 ENCOUNTER — Telehealth: Payer: Self-pay | Admitting: *Deleted

## 2012-11-16 MED ORDER — ESOMEPRAZOLE MAGNESIUM 40 MG PO CPDR: 40.0000 mg | DELAYED_RELEASE_CAPSULE | Freq: Every day | ORAL | Status: DC

## 2012-11-16 NOTE — Telephone Encounter (Signed)
Received fax stating pt currently receiving nexium through [facility] with a copay. Beginning January 1st nexium will be changing from 0 copay to $25 per month. As of January 1st protonix will be available to patient with a 0 copay. We are contacting md to consider changing...Raechel Chute

## 2012-11-16 NOTE — Telephone Encounter (Signed)
Saw Dr. Horald Pollen the other day. He stop her Januvia her A1C was 5.6. Was going to discuss with md next month when she come in. Wanted to know was that ok to hold until she see you..lmb

## 2012-11-16 NOTE — Telephone Encounter (Signed)
Yes - i agree, ok to hold [place]- i updated med list

## 2012-11-16 NOTE — Telephone Encounter (Signed)
Inform pt she states to go ahead and call in a 90 day on the nexium she only takes as needed. If she need refill will change to protonix next year...Raechel Chute

## 2012-11-16 NOTE — Telephone Encounter (Signed)
Ok -please let pt know same - ok with me to change nexium to protonix if ok with pt - may send erx protonix and remove nexium from list as needed

## 2012-11-16 NOTE — Telephone Encounter (Signed)
Notified pt with md response.../lmb 

## 2012-11-19 ENCOUNTER — Other Ambulatory Visit: Payer: Self-pay | Admitting: Internal Medicine

## 2012-12-12 ENCOUNTER — Encounter: Payer: Self-pay | Admitting: Internal Medicine

## 2012-12-12 ENCOUNTER — Ambulatory Visit (INDEPENDENT_AMBULATORY_CARE_PROVIDER_SITE_OTHER): Payer: 59 | Admitting: Internal Medicine

## 2012-12-12 VITALS — BP 118/82 | HR 81 | Temp 98.6°F | Ht 62.0 in | Wt 154.0 lb

## 2012-12-12 DIAGNOSIS — R002 Palpitations: Secondary | ICD-10-CM

## 2012-12-12 DIAGNOSIS — E119 Type 2 diabetes mellitus without complications: Secondary | ICD-10-CM

## 2012-12-12 DIAGNOSIS — G4700 Insomnia, unspecified: Secondary | ICD-10-CM

## 2012-12-12 DIAGNOSIS — I10 Essential (primary) hypertension: Secondary | ICD-10-CM

## 2012-12-12 MED ORDER — TRAZODONE HCL 50 MG PO TABS: 25.0000 mg | ORAL_TABLET | Freq: Every evening | ORAL | Status: DC | PRN

## 2012-12-12 NOTE — Patient Instructions (Signed)
It was good to see you today. Medications reviewed and updated try trazodone for sleep as needed - Your prescription(s) have been submitted to your pharmacy. Please take as directed and contact our office if you believe you are having problem(s) with the medication(s). we'll make referral for heart monitor to evaluate palpitations. Our office will contact you regarding appointment(s) once made. continue working with your other specialists as needed Work on lifestyle changes as discussed (low fat, low carb, increased protein diet; improved exercise efforts; weight loss) to control sugar, blood pressure and cholesterol levels and/or reduce risk of developing other medical problems. Look into [email or web address] or other type of food journal to assist you in this process. Please schedule followup in 6 months, call sooner if problems.

## 2012-12-12 NOTE — Assessment & Plan Note (Signed)
Chronic symptoms -  Would like to avoid nightly Ambien Amitriptyline caused opposite reaction>exacerbation of insomnia Try traz - erx done

## 2012-12-12 NOTE — Progress Notes (Signed)
  Subjective:    Patient ID: Gina Jefferson, female    DOB: [birth year], 62 y.o.   MRN: [id number]  HPI patient is here for follow up - reviewed chronic medical issues:  hx grief rxn summer 2010 due to unexpected death of son  met with susan bond for counseling  feeling better. did not feel daily SSRI needed but pt uses "as needed"   hypertension - reports compliance with ongoing medical treatment and no changes in medication dose or frequency. denies adverse side effects related to current therapy.   dm2 - hx intol to metformin (gi upset)- endo stopped [place] 10/2012 due to symptomatic hypoglycemia and no other medication recommended at this time. denies adverse side effects related to current therapy. does not check cbgs regualry   dyslipidemia - intol of statin tx in past, trial welchol x 2 in 2013 caused constipation side effects - uses fish oil and otc tx - working on diet and exercise, nutrition counseling summer 2013 x 2  Past Medical History  Diagnosis Date  . Anxiety   . HYPERTENSION     under control; has been on med. x 15 yrs.  . Seasonal allergies   . Arthritis     knee, c-spine  . Diabetes mellitus, type 2     NIDDM  . GERD   . Carpal tunnel syndrome of right wrist 10/2011    s/p surgical release  . Hyperlipidemia      Review of Systems  Cardiovascular: Positive for palpitations (daily sx worse with exertion but also awake from sleep 2x/night). Negative for chest pain and leg swelling.  Neurological: Negative for dizziness and headaches.  Psychiatric/Behavioral: Negative for dysphoric mood. The patient is not nervous/anxious and is not hyperactive.       Objective:   Physical Exam  BP 118/82  Pulse 81  Temp 98.6 F (37 C) (Oral)  Ht 5\' 2"  (1.575 m)  Wt 154 lb (69.854 kg)  BMI 28.17 kg/m2  SpO2 96% Wt Readings from Last 3 Encounters:  12/12/12 154 lb (69.854 kg)  09/18/12 153 lb 14.4 oz (69.809 kg)  06/14/12 148 lb 1.9 oz (67.187 kg)   Constitutional:  She appears well-developed and well-nourished. No distress.   Neck: Normal range of motion. Neck supple. No JVD present. No thyromegaly present.  Cardiovascular: Normal rate, regular rhythm and normal heart sounds.  No murmur heard. No BLE edema. Pulmonary/Chest: Effort normal and breath sounds normal. No respiratory distress. She has no wheezes.  Psychiatric: She has a normal mood and affect. Her behavior is normal. Judgment and thought content normal.  Skin- scattered small bruising over prox thighs and B elbow fold - resolving per pt but hx same in 2008  Lab Results  Component Value Date   WBC 5.5 12/19/2011   HGB 12.8 12/19/2011   HCT 37.7 12/19/2011   PLT 241.0 12/19/2011   CHOL 327* 06/14/2012   TRIG 107.0 06/14/2012   HDL 55.20 06/14/2012   LDLDIRECT 236.8 06/14/2012   ALT 28 12/19/2011   AST 26 12/19/2011   NA 142 12/19/2011   K 3.8 12/19/2011   CL 107 12/19/2011   CREATININE 0.7 12/19/2011   BUN 13 12/19/2011   CO2 28 12/19/2011   TSH 0.67 12/19/2011   HGBA1C 6.0 06/14/2012        Assessment & Plan:   palpiatations - exertional but also awake from sleep see problem list. Medications and labs reviewed today.

## 2012-12-12 NOTE — Assessment & Plan Note (Signed)
The current medical regimen is effective;  continue present plan and medications. BP Readings from Last 3 Encounters:  12/12/12 118/82  06/14/12 120/78  12/14/11 112/82

## 2012-12-12 NOTE — Assessment & Plan Note (Signed)
on Januvia until 10/2012, stopped by endo due to symptomatic hypoglycemia and good a1c  ARB for same, intol of statin/welchol Check a1c q67mo and adjust as needed The patient is asked to continue ongoing attempt to improve diet and exercise patterns to aid in medical management of this problem.  Lab Results  Component Value Date   HGBA1C 5.6 11/14/2012

## 2012-12-16 ENCOUNTER — Encounter: Payer: Self-pay | Admitting: Internal Medicine

## 2012-12-17 MED ORDER — ZOLPIDEM TARTRATE 10 MG PO TABS: 10.0000 mg | ORAL_TABLET | Freq: Every evening | ORAL | Status: DC | PRN

## 2012-12-17 NOTE — Telephone Encounter (Signed)
1) Trazodone will be discontinued. Will send prescription for Ambien 10mg , but this medication should not be used every night. Many insurance plans are limiting this medication to only 90 tabs per year. (You should just be aware of this possibility) 2) statins are on your allergy list as an intolerance.

## 2012-12-27 ENCOUNTER — Encounter (INDEPENDENT_AMBULATORY_CARE_PROVIDER_SITE_OTHER): Payer: 59

## 2012-12-27 DIAGNOSIS — R002 Palpitations: Secondary | ICD-10-CM

## 2013-01-01 ENCOUNTER — Telehealth: Payer: Self-pay | Admitting: *Deleted

## 2013-01-01 NOTE — Telephone Encounter (Signed)
Called pt with 24 hr ecardio monitor results of NSR w/ occ PVC's.

## 2013-01-03 IMAGING — MG MM DIGITAL DIAGNOSTIC UNILAT*L*
2 series · 2 of 2 positions shown · non-contrast
Comparison: Prior studies

CLINICAL DATA: Recall from screening mammography

DIGITAL DIAGNOSTIC LEFT BREAST MAMMOGRAM

[L CC]
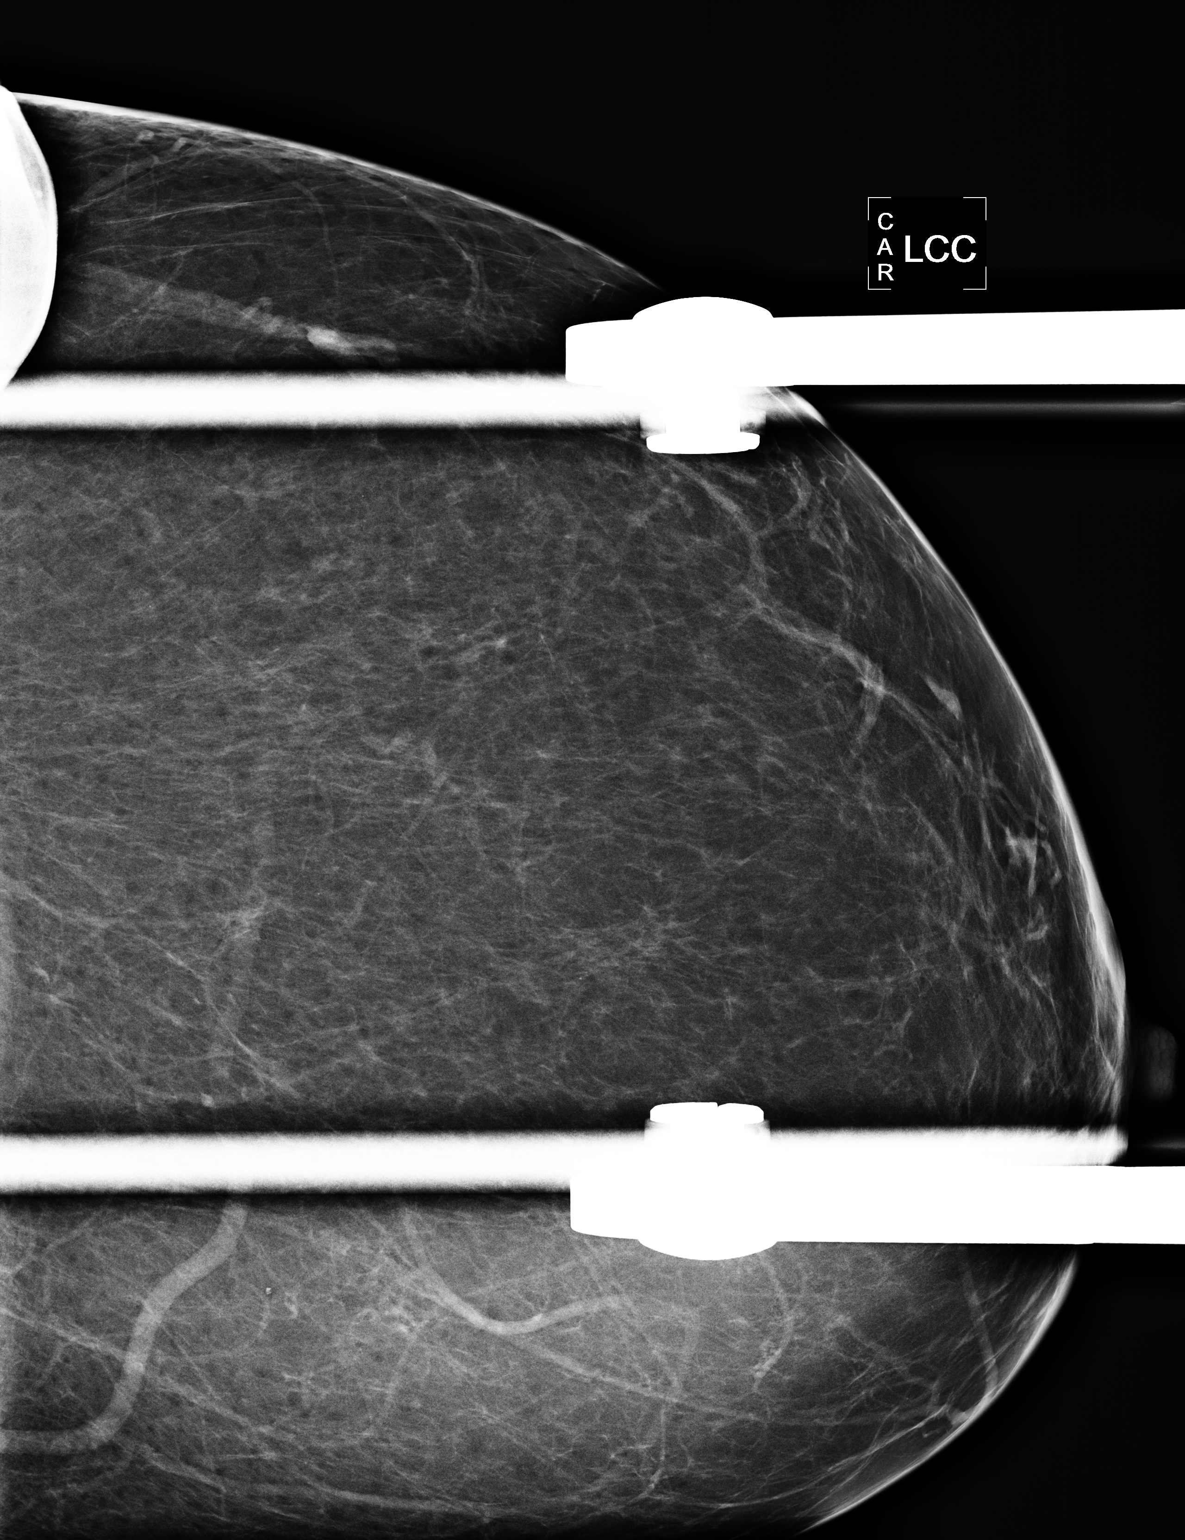

[L MLO]
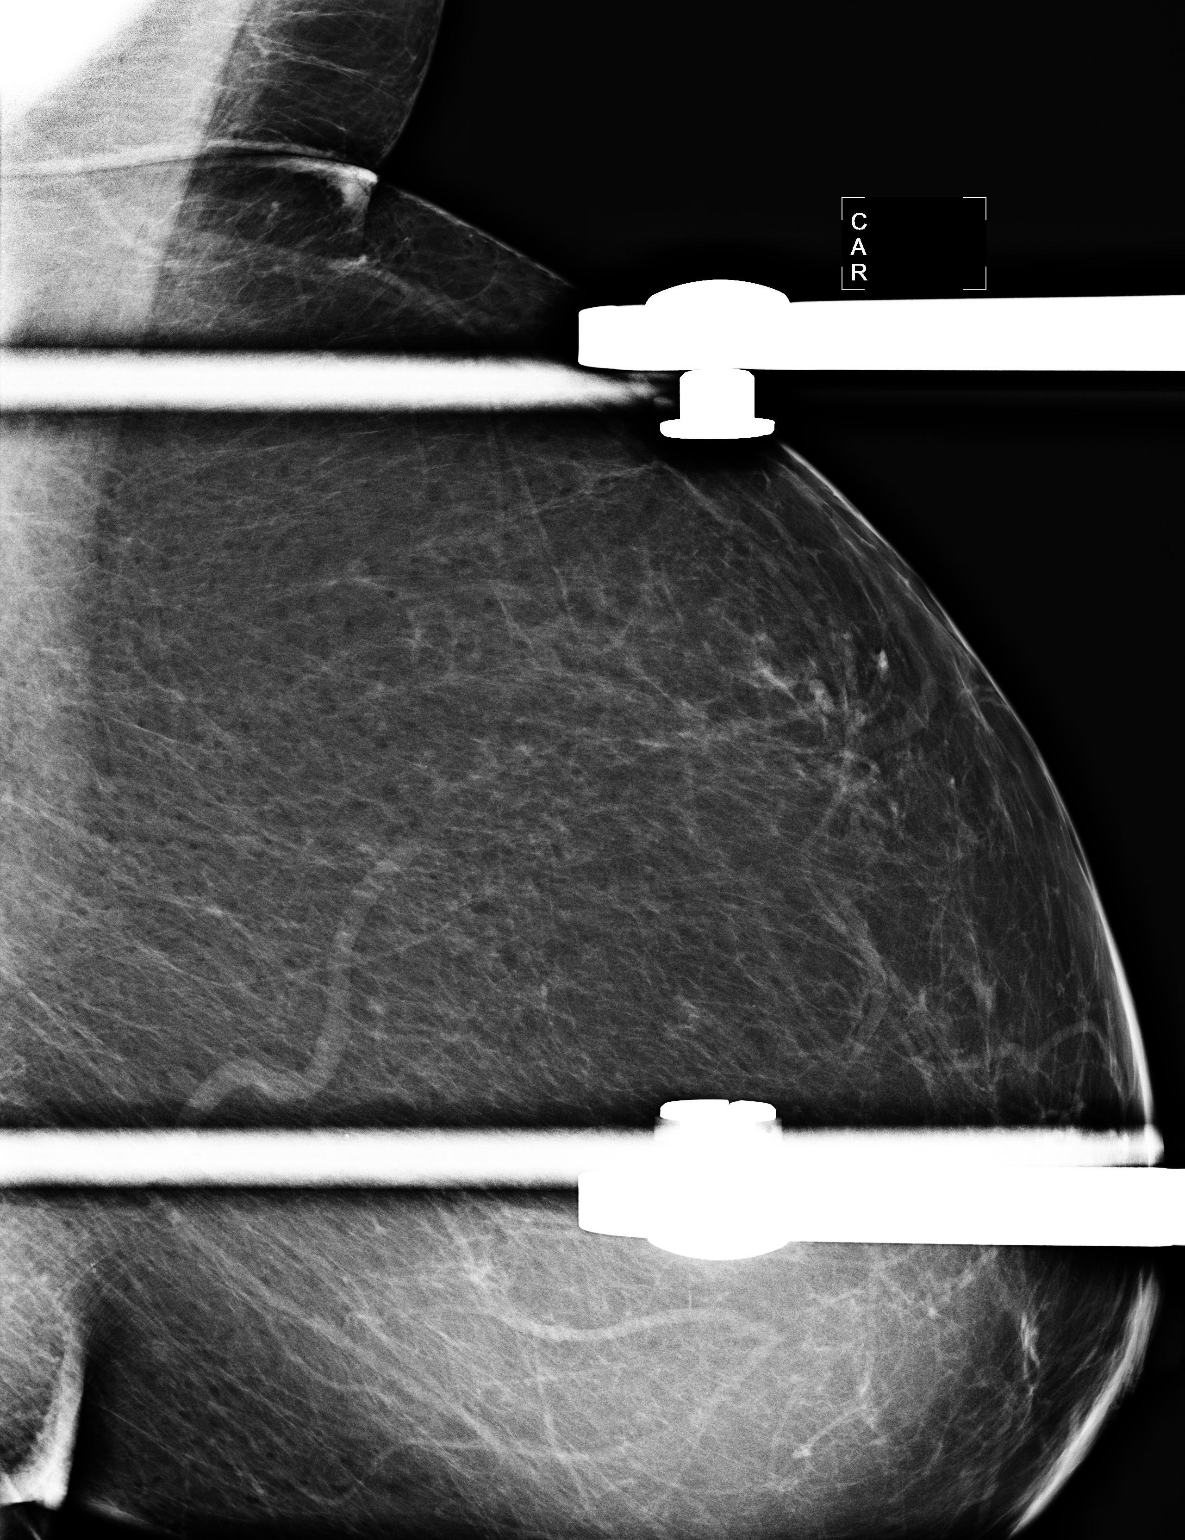

[2 of 2 positions shown; findings below may reference images not displayed]

FINDINGS: Spot compression views of the left breast demonstrate no
persistent abnormality.  The appearance noted on the screening
study is consistent with a summation shadow.
IMPRESSION: No persistent abnormality on additional views of the left breast.
The appearance is consistent with a summation shadow.  Recommend
screening mammography in 1 year.

BI-RADS CATEGORY 1:  Negative.

## 2013-01-15 ENCOUNTER — Ambulatory Visit (INDEPENDENT_AMBULATORY_CARE_PROVIDER_SITE_OTHER): Payer: 59 | Admitting: *Deleted

## 2013-01-15 DIAGNOSIS — Z2911 Encounter for prophylactic immunotherapy for respiratory syncytial virus (RSV): Secondary | ICD-10-CM

## 2013-01-15 DIAGNOSIS — Z23 Encounter for immunization: Secondary | ICD-10-CM

## 2013-03-18 ENCOUNTER — Encounter: Payer: Self-pay | Admitting: Physician Assistant

## 2013-03-18 ENCOUNTER — Encounter: Payer: Self-pay | Admitting: *Deleted

## 2013-03-18 ENCOUNTER — Ambulatory Visit (INDEPENDENT_AMBULATORY_CARE_PROVIDER_SITE_OTHER): Payer: 59 | Admitting: Physician Assistant

## 2013-03-18 VITALS — BP 110/86 | HR 76 | Ht 61.5 in | Wt 158.5 lb

## 2013-03-18 DIAGNOSIS — K6289 Other specified diseases of anus and rectum: Secondary | ICD-10-CM

## 2013-03-18 DIAGNOSIS — Z860100 Personal history of colon polyps, unspecified: Secondary | ICD-10-CM

## 2013-03-18 MED ORDER — HYDROCORTISONE 2.5 % RE CREA: TOPICAL_CREAM | RECTAL | Status: DC

## 2013-03-18 MED ORDER — LIDOCAINE (ANORECTAL) 5 % EX CREA: TOPICAL_CREAM | CUTANEOUS | Status: DC

## 2013-03-18 NOTE — Patient Instructions (Addendum)
We sent prescription for Anusol 2.5 % HC Cream to [facility]. We  have given you samples of Recticare Cream.   Re-evaluate if the  effected area is presisting.

## 2013-03-18 NOTE — Progress Notes (Signed)
Subjective:    Patient ID: Gina Jefferson, female    DOB: 12-Jul-1951, 62 y.o.   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 Leone Payor from prior colonoscopy area she had colonoscopy in August of 2010 with finding of mild left-sided diverticulosis and one 4 mm polyp was removed from the mid transverse colon .Path showed a tubular adenoma. She comes in today with concern about  A knot  that she has felt on her rectum. She says she first noticed this yesterday and denies any tenderness soreness or pain. She has not had any problems with hemorrhoids in the past. She denies any abdominal pain or changes in her bowel habits. He does admit to a pressure type sensation on the outside of her anus.. She has not noticed any bleeding or drainage.    Review of Systems  Constitutional: Negative.   HENT: Negative.   Eyes: Negative.   Respiratory: Negative.   Cardiovascular: Negative.   Gastrointestinal: Negative.   Endocrine: Negative.   Genitourinary: Negative.   Musculoskeletal: Negative.   Skin: Negative.   Allergic/Immunologic: Negative.   Neurological: Negative.   Hematological: Negative.   Psychiatric/Behavioral: Negative.    Outpatient Prescriptions Prior to Visit  Medication Sig Dispense Refill  . Biotin 1000 MCG tablet Take 1,000 mcg by mouth daily.        . cetirizine (ZYRTEC) 10 MG tablet Take 10 mg by mouth daily.        . diclofenac (VOLTAREN) 75 MG EC tablet Take 75 mg by mouth 1 day or 1 dose.      . esomeprazole (NEXIUM) 40 MG capsule Take 1 capsule (40 mg total) by mouth daily before breakfast.  90 capsule  0  . EXFORGE 5-160 MG per tablet TAKE 1 TABLET BY MOUTH ONCE A DAY  90 tablet  1  . Garlic 2000 MG TBEC Take by mouth daily.      . Multiple Vitamin (MULTIVITAMIN) tablet Take 1 tablet by mouth daily.        . sertraline (ZOLOFT) 25 MG tablet TAKE 1/2 TABLET BY MOUTH DAILY FOR 7 DAYS, THEN 1 TABLET DAILY  30 tablet   2  . zolpidem (AMBIEN) 10 MG tablet Take 1 tablet (10 mg total) by mouth at bedtime as needed.  30 tablet  1   No facility-administered medications prior to visit.   Allergies  Allergen Reactions  . Penicillins Rash  . Statins Other (See Comments)    Muscle aches  . Metformin Diarrhea and Other (See Comments)    Vomiting    Patient Active Problem List  Diagnosis  . THYROMEGALY  . GRIEF REACTION  . ALLERGIC RHINITIS  . GERD  . HOT FLASHES  . PALPITATIONS, OCCASIONAL  . DIABETES MELLITUS, TYPE II  . HYPERLIPIDEMIA  . HYPERTENSION  . Oral thrush  . Carpal tunnel syndrome of right wrist  . Postmenopausal status  . Insomnia  . H/O adenomatous polyp of colon   History  Substance Use Topics  . Smoking status: Former Smoker    Types: Cigarettes    Quit date: 11/29/2003  . Smokeless tobacco: Never Used  . Alcohol Use: No     Comment: occasionally   family history includes Alzheimer's disease in her father; Cancer in her maternal grandfather; Diabetes in her mother; Heart disease in her mother; Hyperlipidemia in her father and mother; Hypertension in her father and mother; Mental illness in  her father; and Stroke in her father and mother.     Objective:   Physical Exam well-developed white female in no acute distress, pleasant blood pressure 110/86 pulse 76 height 5 foot 1 weight 158. HEENT; nontraumatic normocephalic EOMI PERRLA sclera anicteric, Neck;Supple no JVD, Abdomen; soft nontender nondistended bowel sounds are active no palpable mass or hepatosplenomegaly, Rectal; exam small nontender mobile lesion on the with a hypertrophied anal papillae, digital exam negative brown stool. Extremities; no clubbing cyanosis or edema skin warm and dry, Psych;mood and affect normal and appropriate        Assessment & Plan:  #60 62 year old female with anal bump most consistent with a hypertrophied anal papillae. This lesion is round, nontender, and mobile.  #2 history of adenomatous  colon polyp 2010 due for followup 2015 #3 diverticulosis  Plan; Will try Anusol HC cream or anti-inflammatory effect to 4 times daily over the next 10 days Will reevaluate in 2-3 weeks if does not resolve in the interim. Patient will be due for followup colonoscopy after [facility] in 2015.

## 2013-03-21 NOTE — Progress Notes (Signed)
Agree with Ms. Esterwood's assessment and plan. Shanaiya Bene E. Lynnie Koehler, MD, FACG   

## 2013-05-16 LAB — HM DIABETES FOOT EXAM

## 2013-05-20 ENCOUNTER — Encounter: Payer: Self-pay | Admitting: Internal Medicine

## 2013-05-29 ENCOUNTER — Other Ambulatory Visit: Payer: Self-pay | Admitting: *Deleted

## 2013-05-29 MED ORDER — AMLODIPINE BESYLATE-VALSARTAN 5-160 MG PO TABS: ORAL_TABLET | ORAL | Status: DC

## 2013-05-29 NOTE — Telephone Encounter (Signed)
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

## 2013-06-07 ENCOUNTER — Ambulatory Visit (INDEPENDENT_AMBULATORY_CARE_PROVIDER_SITE_OTHER): Payer: 59 | Admitting: Gastroenterology

## 2013-06-07 ENCOUNTER — Ambulatory Visit (INDEPENDENT_AMBULATORY_CARE_PROVIDER_SITE_OTHER)
Admission: RE | Admit: 2013-06-07 | Discharge: 2013-06-07 | Disposition: A | Discharge status: Home / Self Care | Payer: 59 | Source: Ambulatory Visit | Attending: Gastroenterology | Admitting: Gastroenterology

## 2013-06-07 ENCOUNTER — Other Ambulatory Visit (INDEPENDENT_AMBULATORY_CARE_PROVIDER_SITE_OTHER): Payer: 59

## 2013-06-07 ENCOUNTER — Encounter: Payer: Self-pay | Admitting: Gastroenterology

## 2013-06-07 VITALS — BP 102/70 | HR 72 | Ht 61.0 in | Wt 154.0 lb

## 2013-06-07 DIAGNOSIS — M549 Dorsalgia, unspecified: Secondary | ICD-10-CM

## 2013-06-07 LAB — CBC WITH DIFFERENTIAL/PLATELET
Basophils Relative: 0.7 % (ref 0.0–3.0)
Eosinophils Absolute: 0.4 10*3/uL (ref 0.0–0.7)
Eosinophils Relative: 4.3 % (ref 0.0–5.0)
Hemoglobin: 13.3 g/dL (ref 12.0–15.0)
Lymphocytes Relative: 41.7 % (ref 12.0–46.0)
MCHC: 33.8 g/dL (ref 30.0–36.0)
Monocytes Relative: 9.2 % (ref 3.0–12.0)
Neutro Abs: 3.6 10*3/uL (ref 1.4–7.7)
Neutrophils Relative %: 44.1 % (ref 43.0–77.0)
RBC: 4.12 Mil/uL (ref 3.87–5.11)
WBC: 8.1 10*3/uL (ref 4.5–10.5)

## 2013-06-07 LAB — COMPREHENSIVE METABOLIC PANEL
AST: 31 U/L (ref 0–37)
Albumin: 3.9 g/dL (ref 3.5–5.2)
BUN: 13 mg/dL (ref 6–23)
CO2: 27 mEq/L (ref 19–32)
Calcium: 9.4 mg/dL (ref 8.4–10.5)
Chloride: 107 mEq/L (ref 96–112)
Creatinine, Ser: 0.7 mg/dL (ref 0.4–1.2)
GFR: 104.08 mL/min (ref >60.00)
Glucose, Bld: 96 mg/dL (ref 70–99)
Potassium: 3.6 mEq/L (ref 3.5–5.1)

## 2013-06-07 MED ORDER — LIDOCAINE 5 % EX PTCH: 1.0000 | MEDICATED_PATCH | CUTANEOUS | Status: DC

## 2013-06-07 NOTE — Patient Instructions (Addendum)
Please go to the basement level to have your labs drawn.  Also go to the [facility] Radiology department.

## 2013-06-07 NOTE — Progress Notes (Signed)
Agree w/ Ms. Rise Mu management.   The xrays did not reveal a cause of pain for sure but she does have degenerative spine disease and scoliosis. She should see her orthopedist unless she is better next week.

## 2013-06-07 NOTE — Progress Notes (Signed)
06/07/2013 Gina Jefferson [id number] 1951/01/16   History of Present Illness:  Patient is a 62 year old female who is a patient of Dr. Marvell Fuller.  She is being seen today for complaints of back pain.  The pain began a couple of weeks ago, but has been worsening over the past few days.  It hurts to the touch.  There are absolutely no other symptoms.  She cannot recall any specific trauma to the area.  It hurts in that area when she takes a deep breath and when she moves.  Took Ibuprofen without relief.  Salonpas patches are not helping.  No overlying skin changes.  Current Medications, Allergies, Past Medical History, Past Surgical History, Family History and Social History were reviewed in [employer] record.   Physical Exam: BP 102/70  Pulse 72  Ht 5\' 1"  (1.549 m)  Wt 154 lb (69.854 kg)  BMI 29.11 kg/m2 General: Well developed black female in no acute distress Head: Normocephalic and atraumatic Eyes:  sclerae anicteric, conjunctiva pink  Ears: Normal auditory acuity Lungs: Clear throughout to auscultation Heart: Regular rate and rhythm Abdomen: Soft, non-tender and non-distended. No masses, no hepatomegaly. Normal bowel sounds. Musculoskeletal: Symmetrical with no gross deformities.  Tender area just to the right of her thoracic spine.  No rash or overlying lesions noted.  Extremities: No edema  Neurological: Alert oriented x 4, grossly nonfocal Psychological:  Alert and cooperative. Normal mood and affect  Assessment and Recommendations: -Back pain:  Very likely musculoskeletal.  Will order thoracic and lumbar spine x-rays.  Will check CBC and CMP.  Will order lidoderm patch for pain.

## 2013-07-31 ENCOUNTER — Telehealth: Payer: Self-pay | Admitting: *Deleted

## 2013-07-31 MED ORDER — NYSTATIN 100000 UNIT/ML MT SUSP: OROMUCOSAL | Status: DC

## 2013-07-31 NOTE — Telephone Encounter (Signed)
Please send Rx for Gina Jefferson for Mycostatin oral suspension 5cc swish and swallow QID x 10 days ... Oral candidiasis. Thanks      Rx sent

## 2013-08-14 ENCOUNTER — Ambulatory Visit (INDEPENDENT_AMBULATORY_CARE_PROVIDER_SITE_OTHER): Payer: Self-pay | Admitting: Family Medicine

## 2013-08-14 VITALS — BP 118/78 | Wt 156.0 lb

## 2013-08-14 DIAGNOSIS — R7303 Prediabetes: Secondary | ICD-10-CM

## 2013-08-14 DIAGNOSIS — R7309 Other abnormal glucose: Secondary | ICD-10-CM

## 2013-08-15 NOTE — Progress Notes (Signed)
Patient presents today for yearly pharmacy consult as part of the employer-sponsored [employer] program. Patient is pre-diabetic and is not currently being treated. In the past she has been on metformin, but did not tolerat this well, and was then started on Januvia. At most recent MD visit, patient was told to discontinue Januvia due to A1c of 5.7 and evidence of hypoglycemia. Patient continues on daily ARB. She is not taking ASA due to daily diclofenac for arthritis. Most recent MD follow-up was this past Gina Jefferson with Dr. Talmage Jefferson. Patient will not be following-up with endocrinologist any longer due to at goal A1c. She will follow-up with PCP, Dr. Felicity Jefferson, yearly. No med changes or major health changes at this time.   Diabetes Assessment: Year of diagnosis 2008; Diabetes Education [facility] classes 2013; takes medications as prescribed; does not take an aspirin a day; Type of Diabetes: Pre-Diabetes; Sees Diabetes provider 1 time a year; MD managing Diabetes Gina Jefferson; A1c 5.7 Gina Jefferson 2014 Other Diabetes History: Patient is pre-diabetic and not currently being treated. In the past she has been on Metformin, but did not tolerate, and was then started on Januvia. Januvia was discontinued in Gina Jefferson when A1c was found to be 5.7 and patient reported hypoglycemia. Patient does maintain good medication compliance. Patient did not bring meter today and is currently testing only when symptomatic. Hypoglycemia frequency is rare, and at the lowest has been 68. Patient does demonstrate appropriate correction of hypoglycemia. Patient denies signs and symptoms of neuropathy including numbness/tingling/burning and symptoms of foot infection. Patient is not due for yearly eye exam.  Lifestyle Factors: Diet - Patient feels her diet is under good control and reports the following upon food recall:  Breakfast - boiled egg, cereal (raisin bran, rice crispies), water, or skim milk with cereal Lunch - packs lunch, often a  salad Supper - cooks at home, bakes, little frying Fruits and Vegetables - 2-3 servings per day Beverages - water mostly, sweet tea and soda on occassion, patient reports she could work to drink fewer sodas. Exercise - No routine exercise at this time. Mainly due to arthritis in knee. Was doing 30-45 minutes per day on Wii fit prior to arthritis in knees flaring up. Tries to take the stairs at work. Would like to resume mild exercise as knees tolerate.   Assessment: Patient presents today with A1c of 5.7, in Gina Jefferson 2014. She is currently pre-diabetic and not being treated. She does have room for improvement in both diet and exercise and has gained 5 lbs over previous 6 months. Patient has set a couple of small goals to help her get back on track and decrease weight by 5 lbs. Patient will follow-up every 3 months. I will clarify who she is to follow-up with.   Plan: 1) Continue to make healthy dietary choices 2) Limit soda and sweet tea 3) Attempt to exercise 1-2 times per week  4) Follow-up in 3 months. Someone will notify you in order to schedule your next appt.

## 2013-08-19 ENCOUNTER — Other Ambulatory Visit: Payer: Self-pay | Admitting: *Deleted

## 2013-08-19 MED ORDER — GLUCOSE BLOOD VI STRP: ORAL_STRIP | Status: DC

## 2013-08-19 NOTE — Telephone Encounter (Signed)
Received fax stating pt is currently using freestyle glucometer prn for testing blood sugars. She is needing more strips but never had filled with [place]. Requesting rx for freestyle testing strips...lmb

## 2013-09-30 LAB — HM PAP SMEAR

## 2013-09-30 LAB — HM MAMMOGRAPHY

## 2013-10-03 ENCOUNTER — Other Ambulatory Visit: Payer: Self-pay

## 2013-10-04 ENCOUNTER — Encounter: Payer: Self-pay | Admitting: Podiatry

## 2013-10-04 ENCOUNTER — Ambulatory Visit (INDEPENDENT_AMBULATORY_CARE_PROVIDER_SITE_OTHER): Payer: 59 | Admitting: Podiatry

## 2013-10-04 VITALS — BP 131/93 | HR 64 | Ht 62.0 in | Wt 158.0 lb

## 2013-10-04 DIAGNOSIS — M216X9 Other acquired deformities of unspecified foot: Secondary | ICD-10-CM

## 2013-10-04 DIAGNOSIS — M778 Other enthesopathies, not elsewhere classified: Secondary | ICD-10-CM | POA: Insufficient documentation

## 2013-10-04 DIAGNOSIS — Q6689 Other  specified congenital deformities of feet: Secondary | ICD-10-CM

## 2013-10-04 DIAGNOSIS — M7750 Other enthesopathy of unspecified foot: Secondary | ICD-10-CM

## 2013-10-04 DIAGNOSIS — M21969 Unspecified acquired deformity of unspecified lower leg: Secondary | ICD-10-CM

## 2013-10-04 NOTE — Progress Notes (Signed)
Pain in left ball of the foot duration off and on for about a year. On feet at work 8 hours daily. Wears tennis shoes at work.   Objective: Normal neurovascular status.  Orthopedic: High Arch cavus foot with tight Achilles tendon bilateral. Hypermobility of the first ray bilateral L>R. Prominent and red 2nd MPJ left plantar. Neurologic: All epicritic and tactile sensations grossly intact. Radiographic examination reveal Cavus foot with elevated first ray L>R, contracted lesser digits. Presence of plantar calcaneal spur bilateral.  Severe abducted hallux. Increased first intermetatarsal angle bilateral. Fibular sesamoid position over 5 bilateral. No acute changes seen.  Assessment: Cavus type foot. 2nd MPJ Capsulitis left. Ankle Equinus. First ray hypermobility with weight shifting to lesser MPJ left.  Plan: Reviewed findings and available treatment options. Both feet were casted for Orthotics. Referral made for physical therapy to stretch Achilles tendon bilateral.

## 2013-10-04 NOTE — Patient Instructions (Signed)
Seen for pain on left ball of foot for over a year.  Findings reveal cavus foot with tight Achilles tendon and unstable first metatarsal bilateral. May benefit from physical therapy and custom orthotics.

## 2013-10-22 ENCOUNTER — Ambulatory Visit: Payer: 59 | Attending: Internal Medicine | Admitting: Physical Therapy

## 2013-10-22 DIAGNOSIS — R293 Abnormal posture: Secondary | ICD-10-CM | POA: Insufficient documentation

## 2013-10-22 DIAGNOSIS — M25676 Stiffness of unspecified foot, not elsewhere classified: Secondary | ICD-10-CM | POA: Insufficient documentation

## 2013-10-22 DIAGNOSIS — R269 Unspecified abnormalities of gait and mobility: Secondary | ICD-10-CM | POA: Insufficient documentation

## 2013-10-22 DIAGNOSIS — IMO0001 Reserved for inherently not codable concepts without codable children: Secondary | ICD-10-CM | POA: Insufficient documentation

## 2013-10-22 DIAGNOSIS — M25669 Stiffness of unspecified knee, not elsewhere classified: Secondary | ICD-10-CM | POA: Insufficient documentation

## 2013-10-22 DIAGNOSIS — M25673 Stiffness of unspecified ankle, not elsewhere classified: Secondary | ICD-10-CM | POA: Insufficient documentation

## 2013-10-30 ENCOUNTER — Ambulatory Visit: Payer: 59 | Attending: Internal Medicine | Admitting: Rehabilitation

## 2013-10-30 DIAGNOSIS — R269 Unspecified abnormalities of gait and mobility: Secondary | ICD-10-CM | POA: Insufficient documentation

## 2013-10-30 DIAGNOSIS — R293 Abnormal posture: Secondary | ICD-10-CM | POA: Insufficient documentation

## 2013-10-30 DIAGNOSIS — M25676 Stiffness of unspecified foot, not elsewhere classified: Secondary | ICD-10-CM | POA: Insufficient documentation

## 2013-10-30 DIAGNOSIS — M25669 Stiffness of unspecified knee, not elsewhere classified: Secondary | ICD-10-CM | POA: Insufficient documentation

## 2013-10-30 DIAGNOSIS — IMO0001 Reserved for inherently not codable concepts without codable children: Secondary | ICD-10-CM | POA: Insufficient documentation

## 2013-10-30 DIAGNOSIS — M25673 Stiffness of unspecified ankle, not elsewhere classified: Secondary | ICD-10-CM | POA: Insufficient documentation

## 2013-11-05 ENCOUNTER — Ambulatory Visit: Payer: 59 | Admitting: Physical Therapy

## 2013-11-06 ENCOUNTER — Ambulatory Visit: Payer: 59 | Admitting: Physical Therapy

## 2013-11-12 ENCOUNTER — Ambulatory Visit: Payer: 59 | Admitting: Physical Therapy

## 2013-11-14 ENCOUNTER — Encounter: Payer: 59 | Admitting: Physical Therapy

## 2013-11-25 ENCOUNTER — Encounter: Payer: 59 | Admitting: Physical Therapy

## 2013-11-26 ENCOUNTER — Encounter: Payer: 59 | Admitting: Physical Therapy

## 2013-11-29 ENCOUNTER — Other Ambulatory Visit: Payer: Self-pay | Admitting: Internal Medicine

## 2013-12-13 ENCOUNTER — Encounter: Payer: Self-pay | Admitting: Podiatry

## 2013-12-13 ENCOUNTER — Ambulatory Visit (INDEPENDENT_AMBULATORY_CARE_PROVIDER_SITE_OTHER): Payer: 59 | Admitting: Podiatry

## 2013-12-13 VITALS — BP 138/87 | HR 65

## 2013-12-13 DIAGNOSIS — M216X9 Other acquired deformities of unspecified foot: Secondary | ICD-10-CM

## 2013-12-13 DIAGNOSIS — Q6689 Other  specified congenital deformities of feet: Secondary | ICD-10-CM

## 2013-12-13 DIAGNOSIS — M21969 Unspecified acquired deformity of unspecified lower leg: Secondary | ICD-10-CM

## 2013-12-13 NOTE — Progress Notes (Signed)
Has worn orthotics x 1 month and had done therapy 3 weeks twice a week. She has both knee problem. Therapy caused her leg cramp and knee pain worse. Today complaining of pain under the 2nd MPJ left.  Objective: Tight Achilles tendon, HAV with bunion, callus under 2nd MPJ bilateral.   Plan: Aperture pad added under the 2nd MPJ left. Patient is to use different tennis shoes. Continue with home exercise.  Return in one month.

## 2013-12-13 NOTE — Patient Instructions (Signed)
Follow up on Orthotics. Adjusted left with felt pad under the 2nd MPJ. Continue with stretch exercise.  Return in one month.

## 2014-01-04 IMAGING — US US SOFT TISSUE HEAD/NECK
1 series · 14 of 25 positions shown · non-contrast
Comparison: 09/12/2008

CLINICAL DATA: Right thyroid nodule on exam.

THYROID ULTRASOUND
TECHNIQUE: Ultrasound examination of the thyroid gland and adjacent
soft tissues was performed.

[Series 1: us soft tissue head/neck · 0.08mm/px · 14 of 38 slices shown]
[im 1/38]
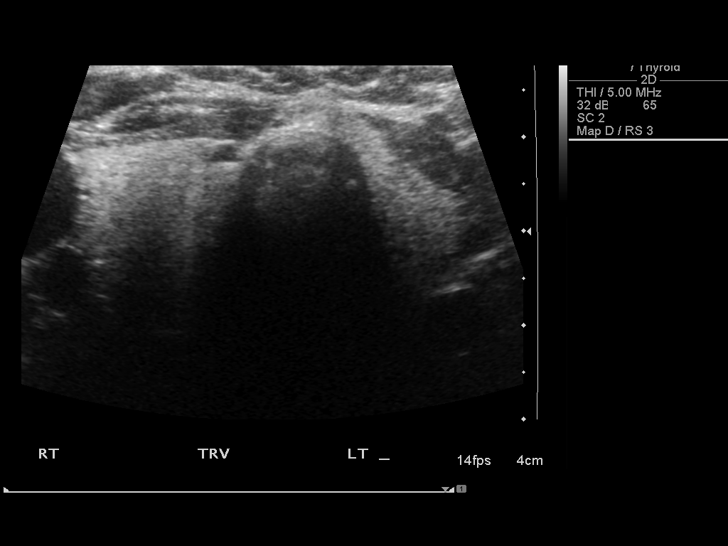
[im 4/38]
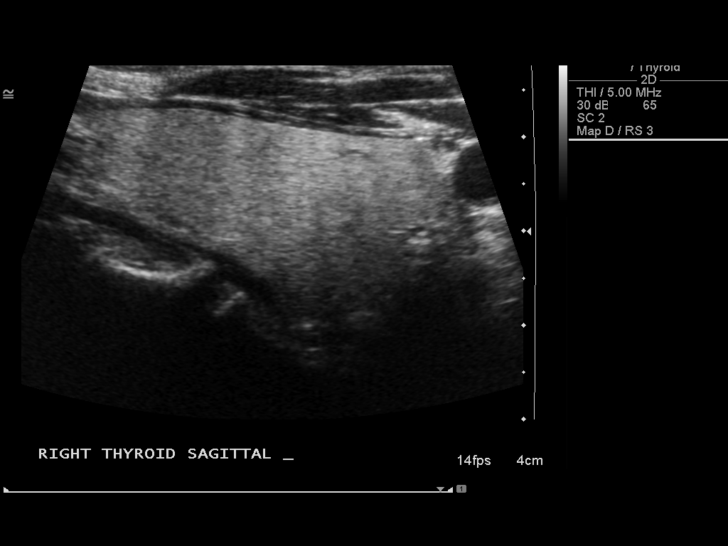
[im 7/38]
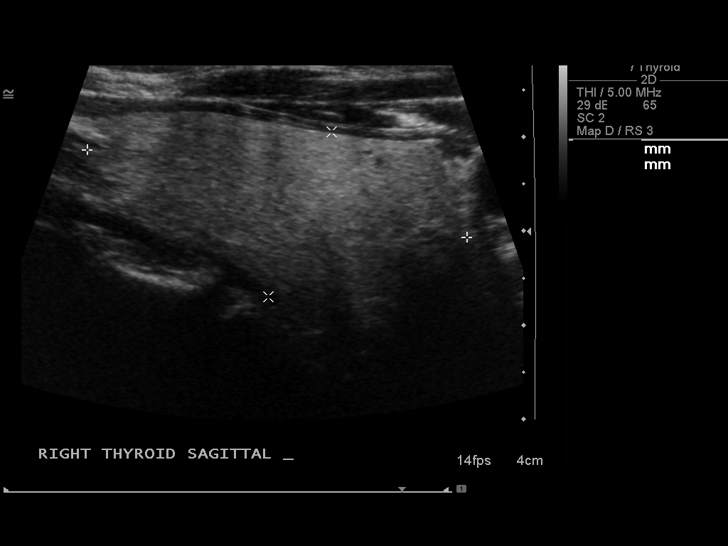
[im 10/38]
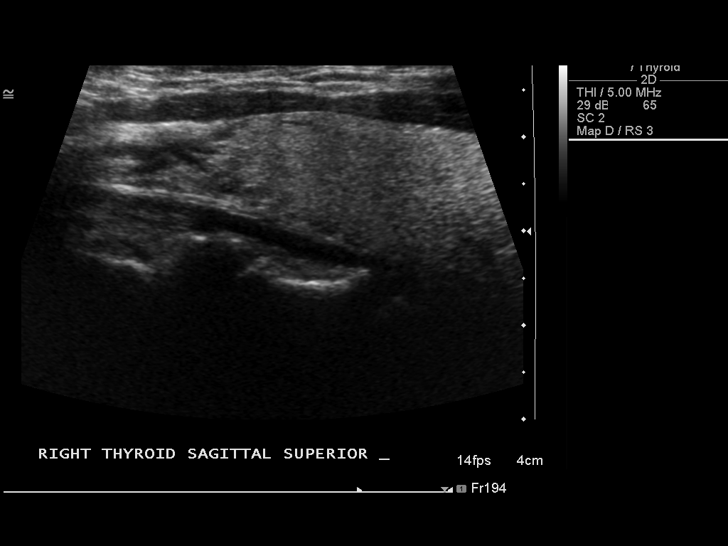
[im 13/38]
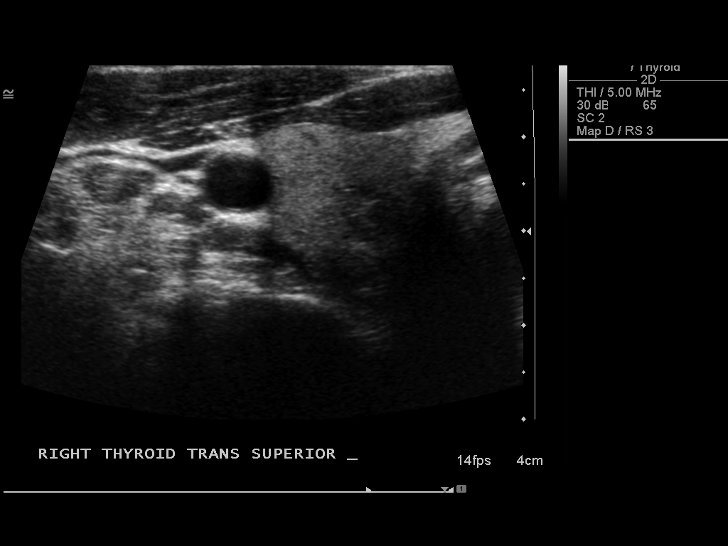
[im 14/38]
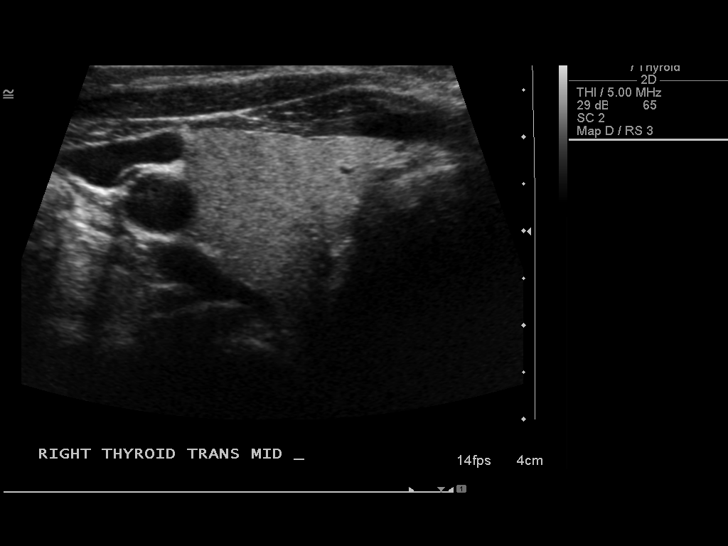
[im 17/38]
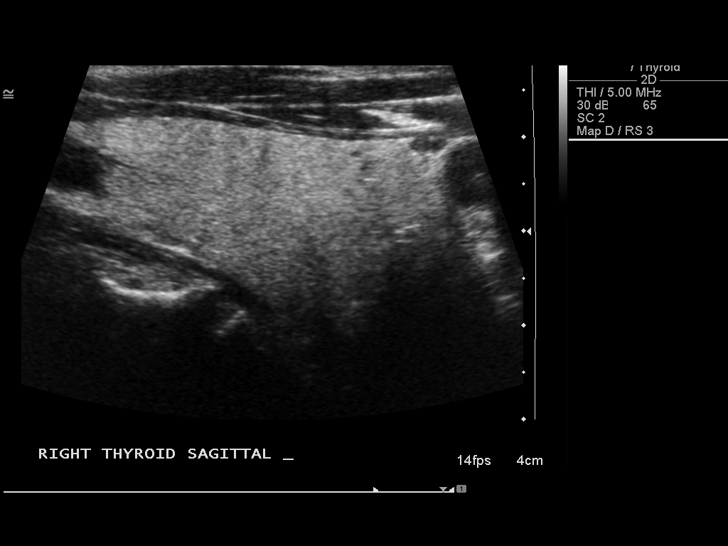
[im 21/38]
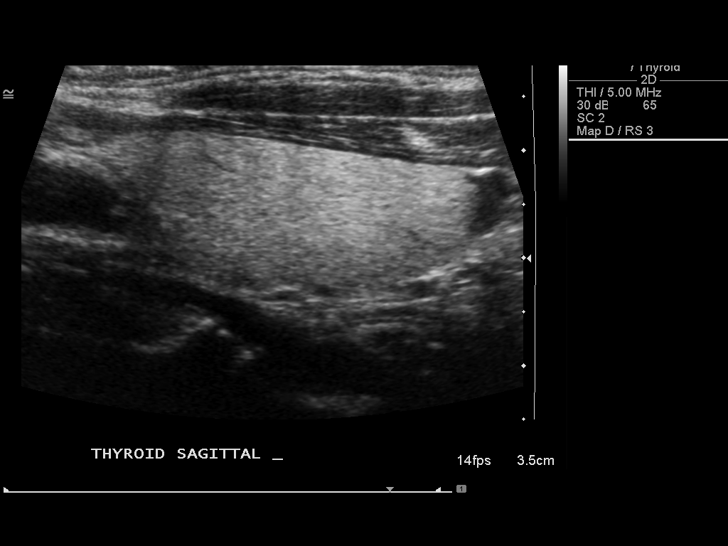
[im 24/38]
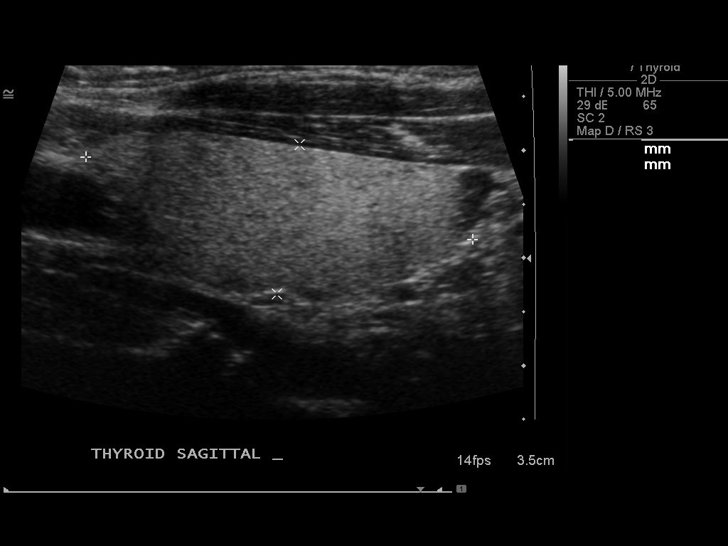
[im 25/38]
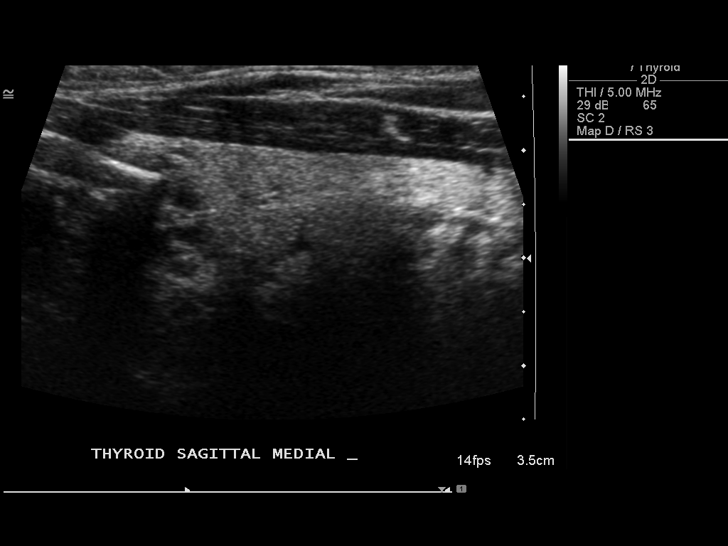
[im 28/38]
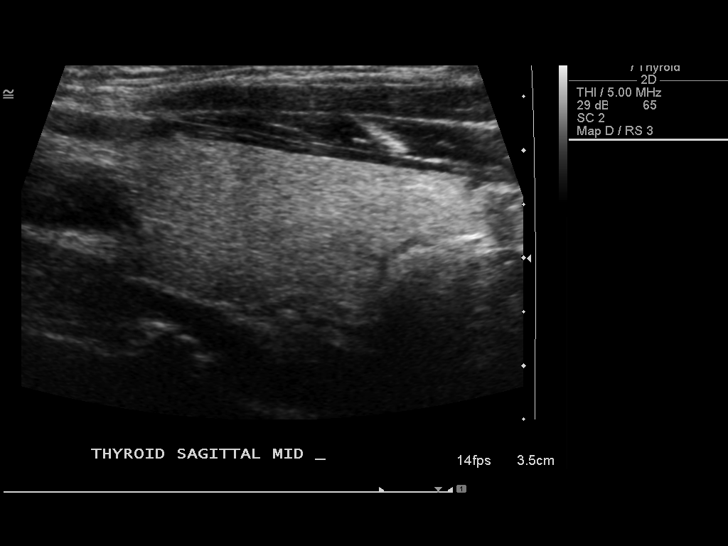
[im 31/38]
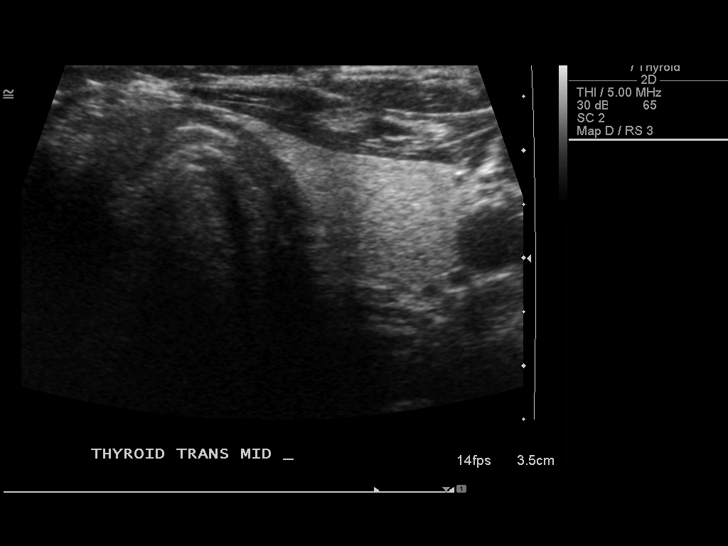
[im 34/38]
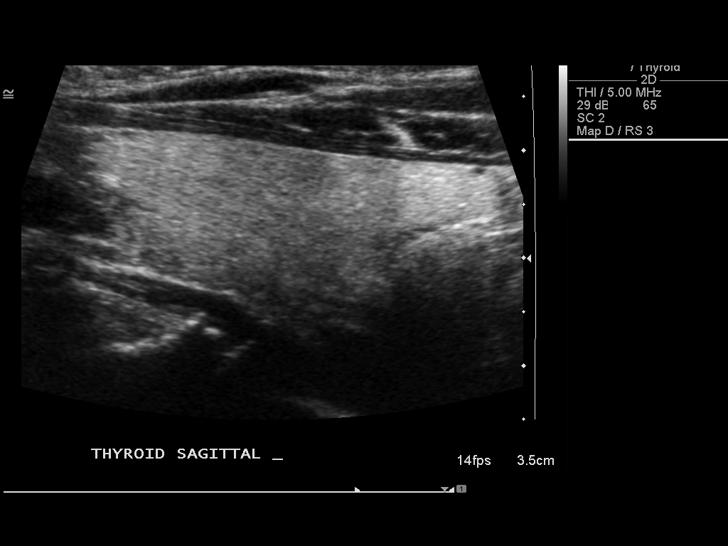
[im 38/38]
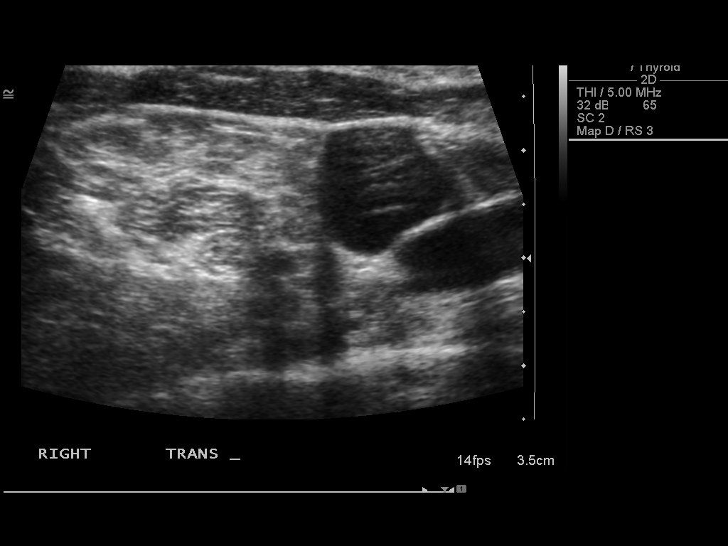

[14 of 25 positions shown; findings below may reference images not displayed]

FINDINGS: Right thyroid lobe:  4.1 x 1.9 x 2.0 cm
Left thyroid lobe:  3.7 x 1.4 x 1.5 cm
Isthmus:  4 mm thickness

Focal nodules:  Homogeneous thyroid echotexture is seen, without
evidence of thyroid nodules or masses.

Lymphadenopathy:  None visualized.
IMPRESSION: Unremarkable exam.  No evidence of thyroid nodules or significant
thyromegaly.  If there is a persistent palpable abnormality in the
right neck, neck CT could be obtained for further evaluation.

## 2014-01-13 ENCOUNTER — Ambulatory Visit: Payer: 59 | Admitting: Podiatry

## 2014-01-17 ENCOUNTER — Ambulatory Visit (INDEPENDENT_AMBULATORY_CARE_PROVIDER_SITE_OTHER): Payer: 59 | Admitting: Podiatry

## 2014-01-17 ENCOUNTER — Encounter: Payer: Self-pay | Admitting: Podiatry

## 2014-01-17 VITALS — BP 124/81 | HR 75 | Ht 62.0 in | Wt 160.0 lb

## 2014-01-17 DIAGNOSIS — M79609 Pain in unspecified limb: Secondary | ICD-10-CM

## 2014-01-17 DIAGNOSIS — L600 Ingrowing nail: Secondary | ICD-10-CM

## 2014-01-17 DIAGNOSIS — M79606 Pain in leg, unspecified: Secondary | ICD-10-CM

## 2014-01-17 DIAGNOSIS — E119 Type 2 diabetes mellitus without complications: Secondary | ICD-10-CM

## 2014-01-17 NOTE — Progress Notes (Signed)
Doing good with orthotics and new tennis shoes. Stated that left great toe nail is painful and plantar calluses are bothering her.   Objective:  Ingrown nail on left great toe medial border. Painful callus under 2-3 MPJ bilateral. No other problems noted.   Assessment: Painful ingrown nail. Painful plantar calluses.   Plan: Painful nails and calluses debrided. Return in 3 months or as needed.

## 2014-01-17 NOTE — Patient Instructions (Signed)
Doing good with Orthotics.  Painful nails and calluses debrided. Return in 3 months or as needed.

## 2014-01-20 ENCOUNTER — Ambulatory Visit (INDEPENDENT_AMBULATORY_CARE_PROVIDER_SITE_OTHER): Payer: 59 | Admitting: Internal Medicine

## 2014-01-20 ENCOUNTER — Encounter: Payer: Self-pay | Admitting: Internal Medicine

## 2014-01-20 VITALS — BP 110/72 | HR 85 | Temp 97.8°F | Ht 61.0 in | Wt 163.1 lb

## 2014-01-20 DIAGNOSIS — E119 Type 2 diabetes mellitus without complications: Secondary | ICD-10-CM

## 2014-01-20 DIAGNOSIS — E785 Hyperlipidemia, unspecified: Secondary | ICD-10-CM

## 2014-01-20 DIAGNOSIS — Z0000 Encounter for general adult medical examination without abnormal findings: Secondary | ICD-10-CM

## 2014-01-20 DIAGNOSIS — I10 Essential (primary) hypertension: Secondary | ICD-10-CM

## 2014-01-20 MED ORDER — BENZONATATE 200 MG PO CAPS: 200.0000 mg | ORAL_CAPSULE | Freq: Three times a day (TID) | ORAL | Status: DC | PRN

## 2014-01-20 NOTE — Assessment & Plan Note (Signed)
Diet controlled since 10/2012 on Januvia until 10/2012, stopped by endo due to symptomatic hypoglycemia and good a1c on ARB for same, intol of statin/welchol Check a1c q6-47mo and adjust as needed The patient is asked to continue ongoing attempt to improve diet and exercise patterns to aid in medical management of this problem.  Lab Results  Component Value Date   HGBA1C 5.6 11/14/2012

## 2014-01-20 NOTE — Assessment & Plan Note (Signed)
Intol of statin side effects (myalgias) Trial welchol 12/2011 caused severe constipation S/p nutrition counseling on same summer 2013 The patient is asked to make an attempt to improve diet and exercise patterns to aid in medical management of this problem.

## 2014-01-20 NOTE — Progress Notes (Signed)
Subjective:    Patient ID: Gina Jefferson, female    DOB: [birth year], 63 y.o.   MRN: [id number]  HPI  patient is here today for annual physical. Patient feels well and has no complaints.  Also reviewed chronic medical issues and interval medical events:  hx grief rxn summer 2010 due to unexpected death of son  met with susan bond for counseling  feeling better. did not feel daily SSRI needed but pt uses "as needed"   hypertension - reports compliance with ongoing medical treatment and no changes in medication dose or frequency. denies adverse side effects related to current therapy.   Hx dm2 - hx intol to metformin (gi upset)- endo stopped [place] 10/2012 due to symptomatic hypoglycemia and no other medication recommended at this time. denies adverse side effects related to current therapy. does not check cbgs regualry   dyslipidemia - intol of statin tx in past, trial welchol x 2 in 2013 caused constipation side effects - uses fish oil and otc tx - working on diet and exercise, nutrition counseling summer 2013 x 2  Past Medical History  Diagnosis Date  . Anxiety   . HYPERTENSION     under control; has been on med. x 15 yrs.  . Seasonal allergies   . Arthritis     knee, c-spine  . Diabetes mellitus, type 2     NIDDM  . GERD   . Carpal tunnel syndrome of right wrist 10/2011    s/p surgical release  . Hyperlipidemia   . Colon polyp   . Hx of adenomatous polyp of colon 2010   Family History  Problem Relation Age of Onset  . Diabetes Mother   . Stroke Mother   . Heart disease Mother   . Hyperlipidemia Mother   . Hypertension Mother   . Stroke Father   . Alzheimer's disease Father   . Mental illness Father   . Hypertension Father   . Hyperlipidemia Father   . Cancer Maternal Grandfather     stomach cancer   History  Substance Use Topics  . Smoking status: Former Smoker    Types: Cigarettes    Quit date: 11/29/2003  . Smokeless tobacco: Never Used  . Alcohol Use:  No     Comment: occasionally    Review of Systems  Constitutional: Negative for fatigue and unexpected weight change (related to poor diet).  HENT: Positive for postnasal drip.   Respiratory: Positive for cough (nonproductive x 3-4 weeks, worse in AM and worse in winter). Negative for shortness of breath and wheezing.   Cardiovascular: Negative for chest pain, palpitations and leg swelling.  Gastrointestinal: Negative for nausea, abdominal pain and diarrhea.  Neurological: Negative for dizziness, weakness, light-headedness and headaches.  Psychiatric/Behavioral: Negative for dysphoric mood. The patient is not nervous/anxious.   All other systems reviewed and are negative.      Objective:   Physical Exam  BP 110/72  Pulse 85  Temp(Src) 97.8 F (36.6 C) (Oral)  Ht 5' 1"  (1.549 m)  Wt 163 lb 1.9 oz (73.991 kg)  BMI 30.84 kg/m2  SpO2 95% Wt Readings from Last 3 Encounters:  01/20/14 163 lb 1.9 oz (73.991 kg)  01/17/14 160 lb (72.576 kg)  10/04/13 158 lb (71.668 kg)   Constitutional: She appears well-developed and well-nourished. No distress.  HENT: Head: Normocephalic and atraumatic. Ears: B TMs ok, no erythema or effusion; Nose: Nose normal. Mouth/Throat: Oropharynx is clear and moist. No oropharyngeal exudate.  Eyes: Conjunctivae and EOM  are normal. Pupils are equal, round, and reactive to light. No scleral icterus.  Neck: Normal range of motion. Neck supple. No JVD present. No thyromegaly present.  Cardiovascular: Normal rate, regular rhythm and normal heart sounds.  No murmur heard. No BLE edema. Pulmonary/Chest: Effort normal and breath sounds normal. No respiratory distress. She has no wheezes.  Abdominal: Soft. Bowel sounds are normal. She exhibits no distension. There is no tenderness. no masses Musculoskeletal: Normal range of motion, no joint effusions. No gross deformities Neurological: She is alert and oriented to person, place, and time. No cranial nerve deficit.  Coordination, balance, strength, speech and gait are normal.  Skin: Skin is warm and dry. No rash noted. No erythema.  Psychiatric: She has a normal mood and affect. Her behavior is normal. Judgment and thought content normal.    Lab Results  Component Value Date   WBC 8.1 06/07/2013   HGB 13.3 06/07/2013   HCT 39.3 06/07/2013   PLT 232.0 06/07/2013   CHOL 327* 06/14/2012   TRIG 107.0 06/14/2012   HDL 55.20 06/14/2012   LDLDIRECT 236.8 06/14/2012   ALT 30 06/07/2013   AST 31 06/07/2013   NA 139 06/07/2013   K 3.6 06/07/2013   CL 107 06/07/2013   CREATININE 0.7 06/07/2013   BUN 13 06/07/2013   CO2 27 06/07/2013   TSH 0.67 12/19/2011   HGBA1C 5.6 11/14/2012   Dg Thoracic Spine 2 View  06/07/2013   *RADIOLOGY REPORT*  Clinical Data: Right lower thoracic back pain.  THORACIC SPINE - 2 VIEW  Comparison: Chest x-ray dated 06/13/2008  Findings: There is a slight thoracolumbar scoliosis.  No bone destruction or mass lesions.  No fractures.  There is disc space narrowing at multiple levels.  IMPRESSION: No acute abnormalities.  Multilevel degenerative disc disease. Slight scoliosis.   Original Report Authenticated By: Lorriane Shire, M.D.   Dg Lumbar Spine 2-3 Views  06/07/2013   *RADIOLOGY REPORT*  Clinical Data: Right-sided lower thoracic and upper lumbar pain.  LUMBAR SPINE - 2-3 VIEW  Comparison: None.  Findings: There is a slight rotoscoliosis of the lumbar spine. There is narrowing of the disc spaces at T10-11 and T11-12.  No fractures or bone destruction.  The patient has severe degenerative facet arthritis at L3-4, L4-5, and at L5-S1.  There is grade 1 spondylolisthesis of L4 on L5.  No disc space narrowing in the lumbar spine.  IMPRESSION: Scoliosis with severe degenerative facet arthritis in the lower lumbar spine with grade 1 spondylolisthesis of L4 on L5.   Original Report Authenticated By: Lorriane Shire, M.D.       Assessment & Plan:   CPX/v70.0 - Patient has been counseled on age-appropriate  routine health concerns for screening and prevention. These are reviewed and up-to-date. Immunizations are up-to-date or declined. Labs ordered and reviewed.  Problem List Items Addressed This Visit   DIABETES MELLITUS, TYPE II      Diet controlled since 10/2012 on Januvia until 10/2012, stopped by endo due to symptomatic hypoglycemia and good a1c on ARB for same, intol of statin/welchol Check a1c q6-67moand adjust as needed The patient is asked to continue ongoing attempt to improve diet and exercise patterns to aid in medical management of this problem.  Lab Results  Component Value Date   HGBA1C 5.6 11/14/2012      Relevant Orders      Hemoglobin A1c      Microalbumin / creatinine urine ratio   HYPERLIPIDEMIA     Intol  of statin side effects (myalgias) Trial welchol 12/2011 caused severe constipation S/p nutrition counseling on same summer 2013 The patient is asked to make an attempt to improve diet and exercise patterns to aid in medical management of this problem.      HYPERTENSION      The current medical regimen is effective;  continue present plan and medications. BP Readings from Last 3 Encounters:  01/20/14 110/72  01/17/14 124/81  12/13/13 138/87       Other Visit Diagnoses   Routine general medical examination at a health care facility    -  Primary    Relevant Orders       Basic metabolic panel       CBC with Differential       Hepatic function panel       Lipid panel       TSH       Urinalysis, Routine w reflex microscopic

## 2014-01-20 NOTE — Assessment & Plan Note (Signed)
The current medical regimen is effective;  continue present plan and medications. BP Readings from Last 3 Encounters:  01/20/14 110/72  01/17/14 124/81  12/13/13 138/87

## 2014-01-20 NOTE — Patient Instructions (Addendum)
It was good to see you today.  We have reviewed your prior records including labs and tests today  Health Maintenance reviewed - all recommended immunizations and age-appropriate screenings are up-to-date.  Test(s) ordered today. Your results will be released to [facility] (or called to you) after review, usually within 72hours after test completion. If any changes need to be made, you will be notified at that same time.  Medications reviewed and updated, no changes recommended at this time.  Please schedule followup in 12 months for annual exam and labs, call sooner if problems.  Health Maintenance, Female A healthy lifestyle and preventative care can promote health and wellness.  Maintain regular health, dental, and eye exams.  Eat a healthy diet. Foods like vegetables, fruits, whole grains, low-fat dairy products, and lean protein foods contain the nutrients you need without too many calories. Decrease your intake of foods high in solid fats, added sugars, and salt. Get information about a proper diet from your caregiver, if necessary.  Regular physical exercise is one of the most important things you can do for your health. Most adults should get at least 150 minutes of moderate-intensity exercise (any activity that increases your heart rate and causes you to sweat) each week. In addition, most adults need muscle-strengthening exercises on 2 or more days a week.   Maintain a healthy weight. The body mass index (BMI) is a screening tool to identify possible weight problems. It provides an estimate of body fat based on height and weight. Your caregiver can help determine your BMI, and can help you achieve or maintain a healthy weight. For adults 20 years and older:  A BMI below 18.5 is considered underweight.  A BMI of 18.5 to 24.9 is normal.  A BMI of 25 to 29.9 is considered overweight.  A BMI of 30 and above is considered obese.  Maintain normal blood lipids and cholesterol by  exercising and minimizing your intake of saturated fat. Eat a balanced diet with plenty of fruits and vegetables. Blood tests for lipids and cholesterol should begin at age 96 and be repeated every 5 years. If your lipid or cholesterol levels are high, you are over 50, or you are a high risk for heart disease, you may need your cholesterol levels checked more frequently.Ongoing high lipid and cholesterol levels should be treated with medicines if diet and exercise are not effective.  If you smoke, find out from your caregiver how to quit. If you do not use tobacco, do not start.  Lung cancer screening is recommended for adults aged 32 80 years who are at high risk for developing lung cancer because of a history of smoking. Yearly low-dose computed tomography (CT) is recommended for people who have at least a 30-pack-year history of smoking and are a current smoker or have quit within the past 15 years. A pack year of smoking is smoking an average of 1 pack of cigarettes a day for 1 year (for example: 1 pack a day for 30 years or 2 packs a day for 15 years). Yearly screening should continue until the smoker has stopped smoking for at least 15 years. Yearly screening should also be stopped for people who develop a health problem that would prevent them from having lung cancer treatment.  If you are pregnant, do not drink alcohol. If you are breastfeeding, be very cautious about drinking alcohol. If you are not pregnant and choose to drink alcohol, do not exceed 1 drink per day. One drink  is considered to be 12 ounces (355 mL) of beer, 5 ounces (148 mL) of wine, or 1.5 ounces (44 mL) of liquor.  Avoid use of street drugs. Do not share needles with anyone. Ask for help if you need support or instructions about stopping the use of drugs.  High blood pressure causes heart disease and increases the risk of stroke. Blood pressure should be checked at least every 1 to 2 years. Ongoing high blood pressure should be  treated with medicines, if weight loss and exercise are not effective.  If you are 39 to 62 years old, ask your caregiver if you should take aspirin to prevent strokes.  Diabetes screening involves taking a blood sample to check your fasting blood sugar level. This should be done once every 3 years, after age 66, if you are within normal weight and without risk factors for diabetes. Testing should be considered at a younger age or be carried out more frequently if you are overweight and have at least 1 risk factor for diabetes.  Breast cancer screening is essential preventative care for women. You should practice "breast self-awareness." This means understanding the normal appearance and feel of your breasts and may include breast self-examination. Any changes detected, no matter how small, should be reported to a caregiver. Women in their 73s and 30s should have a clinical breast exam (CBE) by a caregiver as part of a regular health exam every 1 to 3 years. After age 39, women should have a CBE every year. Starting at age 91, women should consider having a mammogram (breast X-ray) every year. Women who have a family history of breast cancer should talk to their caregiver about genetic screening. Women at a high risk of breast cancer should talk to their caregiver about having an MRI and a mammogram every year.  Breast cancer gene (BRCA)-related cancer risk assessment is recommended for women who have family members with BRCA-related cancers. BRCA-related cancers include breast, ovarian, tubal, and peritoneal cancers. Having family members with these cancers may be associated with an increased risk for harmful changes (mutations) in the breast cancer genes BRCA1 and BRCA2. Results of the assessment will determine the need for genetic counseling and BRCA1 and BRCA2 testing.  The Pap test is a screening test for cervical cancer. Women should have a Pap test starting at age 63. Between ages 85 and 56, Pap  tests should be repeated every 2 years. Beginning at age 29, you should have a Pap test every 3 years as long as the past 3 Pap tests have been normal. If you had a hysterectomy for a problem that was not cancer or a condition that could lead to cancer, then you no longer need Pap tests. If you are between ages 49 and 47, and you have had normal Pap tests going back 10 years, you no longer need Pap tests. If you have had past treatment for cervical cancer or a condition that could lead to cancer, you need Pap tests and screening for cancer for at least 20 years after your treatment. If Pap tests have been discontinued, risk factors (such as a new sexual partner) need to be reassessed to determine if screening should be resumed. Some women have medical problems that increase the chance of getting cervical cancer. In these cases, your caregiver may recommend more frequent screening and Pap tests.  The human papillomavirus (HPV) test is an additional test that may be used for cervical cancer screening. The HPV test looks for  the virus that can cause the cell changes on the cervix. The cells collected during the Pap test can be tested for HPV. The HPV test could be used to screen women aged 8 years and older, and should be used in women of any age who have unclear Pap test results. After the age of 26, women should have HPV testing at the same frequency as a Pap test.  Colorectal cancer can be detected and often prevented. Most routine colorectal cancer screening begins at the age of 33 and continues through age 14. However, your caregiver may recommend screening at an earlier age if you have risk factors for colon cancer. On a yearly basis, your caregiver may provide home test kits to check for hidden blood in the stool. Use of a small camera at the end of a tube, to directly examine the colon (sigmoidoscopy or colonoscopy), can detect the earliest forms of colorectal cancer. Talk to your caregiver about this at  age 60, when routine screening begins. Direct examination of the colon should be repeated every 5 to 10 years through age 32, unless early forms of pre-cancerous polyps or small growths are found.  Hepatitis C blood testing is recommended for all people born from [birth year] through 1964 and any individual with known risks for hepatitis C.  Practice safe sex. Use condoms and avoid high-risk sexual practices to reduce the spread of sexually transmitted infections (STIs). Sexually active women aged 39 and younger should be checked for Chlamydia, which is a common sexually transmitted infection. Older women with new or multiple partners should also be tested for Chlamydia. Testing for other STIs is recommended if you are sexually active and at increased risk.  Osteoporosis is a disease in which the bones lose minerals and strength with aging. This can result in serious bone fractures. The risk of osteoporosis can be identified using a bone density scan. Women ages 54 and over and women at risk for fractures or osteoporosis should discuss screening with their caregivers. Ask your caregiver whether you should be taking a calcium supplement or vitamin D to reduce the rate of osteoporosis.  Menopause can be associated with physical symptoms and risks. Hormone replacement therapy is available to decrease symptoms and risks. You should talk to your caregiver about whether hormone replacement therapy is right for you.  Use sunscreen. Apply sunscreen liberally and repeatedly throughout the day. You should seek shade when your shadow is shorter than you. Protect yourself by wearing long sleeves, pants, a wide-brimmed hat, and sunglasses year round, whenever you are outdoors.  Notify your caregiver of new moles or changes in moles, especially if there is a change in shape or color. Also notify your caregiver if a mole is larger than the size of a pencil eraser.  Stay current with your immunizations. Document Released:  05/30/2011 Document Revised: 03/11/2013 Document Reviewed: 05/30/2011 [facility] Patient Information 2014 [place]. Diabetes and Standards of Medical Care  Diabetes is complicated. You may find that your diabetes team includes a dietitian, nurse, diabetes educator, eye doctor, and more. To help everyone know what is going on and to help you get the care you deserve, the following schedule of care was developed to help keep you on track. Below are the tests, exams, vaccines, medicines, education, and plans you will need. HbA1c test This test shows how well you have controlled your glucose over the past 2 3 months. It is used to see if your diabetes management plan needs to be adjusted.  It is performed at least 2 times a year if you are meeting treatment goals.  It is performed 4 times a year if therapy has changed or if you are not meeting treatment goals. Blood pressure test  This test is performed at every routine medical visit. The goal is less than 140/90 mmHg for most people, but 130/80 mmHg in some cases. Ask your health care provider about your goal. Dental exam  Follow up with the dentist regularly. Eye exam  If you are diagnosed with type 1 diabetes as a child, get an exam upon reaching the age of 45 years or older and have had diabetes for 3 5 years. Yearly eye exams are recommended after that initial eye exam.  If you are diagnosed with type 1 diabetes as an adult, get an exam within 5 years of diagnosis and then yearly.  If you are diagnosed with type 2 diabetes, get an exam as soon as possible after the diagnosis and then yearly. Foot care exam  Visual foot exams are performed at every routine medical visit. The exams check for cuts, injuries, or other problems with the feet.  A comprehensive foot exam should be done yearly. This includes visual inspection as well as assessing foot pulses and testing for loss of sensation.  Check your feet nightly for cuts, injuries,  or other problems with your feet. Tell your health care provider if anything is not healing. Kidney function test (urine microalbumin)  This test is performed once a year.  Type 1 diabetes: The first test is performed 5 years after diagnosis.  Type 2 diabetes: The first test is performed at the time of diagnosis.  A serum creatinine and estimated glomerular filtration rate (eGFR) test is done once a year to assess the level of chronic kidney disease (CKD), if present. Lipid profile (cholesterol, HDL, LDL, triglycerides)  Performed every 5 years for most people.  The goal for LDL is less than 100 mg/dL. If you are at high risk, the goal is less than 70 mg/dL.  The goal for HDL is 40 mg/dL 50 mg/dL for men and 50 mg/dL 60 mg/dL for women. An HDL cholesterol of 60 mg/dL or higher gives some protection against heart disease.  The goal for triglycerides is less than 150 mg/dL. Influenza vaccine, pneumococcal vaccine, and hepatitis B vaccine  The influenza vaccine is recommended yearly.  The pneumococcal vaccine is generally given once in a lifetime. However, there are some instances when another vaccination is recommended. Check with your health care provider.  The hepatitis B vaccine is also recommended for adults with diabetes. Diabetes self-management education  Education is recommended at diagnosis and ongoing as needed. Treatment plan  Your treatment plan is reviewed at every medical visit. Document Released: 09/11/2009 Document Revised: 07/17/2013 Document Reviewed: 04/16/2013 [facility] Patient Information 2014 [place].

## 2014-01-20 NOTE — Progress Notes (Signed)
Pre-visit discussion using our clinic review tool. No additional management support is needed unless otherwise documented below in the visit note.  

## 2014-01-21 ENCOUNTER — Other Ambulatory Visit (INDEPENDENT_AMBULATORY_CARE_PROVIDER_SITE_OTHER): Payer: 59

## 2014-01-21 DIAGNOSIS — E119 Type 2 diabetes mellitus without complications: Secondary | ICD-10-CM

## 2014-01-21 DIAGNOSIS — Z0000 Encounter for general adult medical examination without abnormal findings: Secondary | ICD-10-CM

## 2014-01-21 LAB — URINALYSIS, ROUTINE W REFLEX MICROSCOPIC
BILIRUBIN URINE: NEGATIVE
HGB URINE DIPSTICK: NEGATIVE
Ketones, ur: NEGATIVE
Leukocytes, UA: NEGATIVE
Nitrite: NEGATIVE
RBC / HPF: NONE SEEN (ref 0–?)
Specific Gravity, Urine: 1.01 (ref 1.000–1.030)
Total Protein, Urine: NEGATIVE
URINE GLUCOSE: NEGATIVE
Urobilinogen, UA: 0.2 (ref 0.0–1.0)
WBC, UA: NONE SEEN (ref 0–?)
pH: 6 (ref 5.0–8.0)

## 2014-01-21 LAB — HEPATIC FUNCTION PANEL
ALBUMIN: 3.9 g/dL (ref 3.5–5.2)
ALT: 39 U/L — ABNORMAL HIGH (ref 0–35)
AST: 33 U/L (ref 0–37)
Alkaline Phosphatase: 82 U/L (ref 39–117)
BILIRUBIN TOTAL: 1 mg/dL (ref 0.3–1.2)
Bilirubin, Direct: 0.1 mg/dL (ref 0.0–0.3)
Total Protein: 7.4 g/dL (ref 6.0–8.3)

## 2014-01-21 LAB — CBC WITH DIFFERENTIAL/PLATELET
BASOS PCT: 0.8 % (ref 0.0–3.0)
Basophils Absolute: 0.1 10*3/uL (ref 0.0–0.1)
Eosinophils Absolute: 0.3 10*3/uL (ref 0.0–0.7)
Eosinophils Relative: 4.8 % (ref 0.0–5.0)
HCT: 42 % (ref 36.0–46.0)
HEMOGLOBIN: 13.9 g/dL (ref 12.0–15.0)
LYMPHS PCT: 45.7 % (ref 12.0–46.0)
Lymphs Abs: 3.1 10*3/uL (ref 0.7–4.0)
MCHC: 33 g/dL (ref 30.0–36.0)
MCV: 96.7 fl (ref 78.0–100.0)
MONOS PCT: 8.9 % (ref 3.0–12.0)
Monocytes Absolute: 0.6 10*3/uL (ref 0.1–1.0)
NEUTROS ABS: 2.7 10*3/uL (ref 1.4–7.7)
Neutrophils Relative %: 39.8 % — ABNORMAL LOW (ref 43.0–77.0)
Platelets: 231 10*3/uL (ref 150.0–400.0)
RBC: 4.34 Mil/uL (ref 3.87–5.11)
RDW: 13.5 % (ref 11.5–14.6)
WBC: 6.7 10*3/uL (ref 4.5–10.5)

## 2014-01-21 LAB — LIPID PANEL
CHOLESTEROL: 291 mg/dL — AB (ref 0–200)
HDL: 55.2 mg/dL (ref >39.00)
Total CHOL/HDL Ratio: 5
Triglycerides: 99 mg/dL (ref 0.0–149.0)
VLDL: 19.8 mg/dL (ref 0.0–40.0)

## 2014-01-21 LAB — BASIC METABOLIC PANEL
BUN: 13 mg/dL (ref 6–23)
CO2: 30 mEq/L (ref 19–32)
CREATININE: 0.7 mg/dL (ref 0.4–1.2)
Calcium: 9.8 mg/dL (ref 8.4–10.5)
Chloride: 105 mEq/L (ref 96–112)
GFR: 110.85 mL/min (ref >60.00)
Glucose, Bld: 86 mg/dL (ref 70–99)
Potassium: 3.8 mEq/L (ref 3.5–5.1)
SODIUM: 140 meq/L (ref 135–145)

## 2014-01-21 LAB — MICROALBUMIN / CREATININE URINE RATIO
CREATININE, U: 44.9 mg/dL
MICROALB UR: 0.1 mg/dL (ref 0.0–1.9)
MICROALB/CREAT RATIO: 0.2 mg/g (ref 0.0–30.0)

## 2014-01-21 LAB — HEMOGLOBIN A1C: Hgb A1c MFr Bld: 6.1 % (ref 4.6–6.5)

## 2014-01-21 LAB — TSH: TSH: 0.74 u[IU]/mL (ref 0.35–5.50)

## 2014-01-21 LAB — LDL CHOLESTEROL, DIRECT: LDL DIRECT: 224.3 mg/dL

## 2014-03-04 ENCOUNTER — Other Ambulatory Visit: Payer: Self-pay | Admitting: Internal Medicine

## 2014-03-06 ENCOUNTER — Other Ambulatory Visit: Payer: Self-pay | Admitting: Internal Medicine

## 2014-04-16 ENCOUNTER — Ambulatory Visit: Payer: 59 | Admitting: Podiatry

## 2014-04-28 ENCOUNTER — Ambulatory Visit (INDEPENDENT_AMBULATORY_CARE_PROVIDER_SITE_OTHER): Payer: 59 | Admitting: Podiatry

## 2014-04-28 ENCOUNTER — Encounter: Payer: Self-pay | Admitting: Podiatry

## 2014-04-28 VITALS — BP 123/79 | HR 74 | Ht 62.0 in | Wt 160.0 lb

## 2014-04-28 DIAGNOSIS — M79606 Pain in leg, unspecified: Secondary | ICD-10-CM

## 2014-04-28 DIAGNOSIS — B351 Tinea unguium: Secondary | ICD-10-CM | POA: Insufficient documentation

## 2014-04-28 DIAGNOSIS — L600 Ingrowing nail: Secondary | ICD-10-CM

## 2014-04-28 DIAGNOSIS — M79609 Pain in unspecified limb: Secondary | ICD-10-CM

## 2014-04-28 NOTE — Progress Notes (Signed)
Subjective: 63 year old female presents complaining of painful toe nail on right great toe.    Objective:  Ingrown nail on right great toe lateral border.  No other problems noted.   Assessment: Painful ingrown nail right great toe.   Plan:  Painful nails debrided.  Return in 3 months or as needed.

## 2014-04-28 NOTE — Patient Instructions (Signed)
Seen for painful ingrown nail. All nails debrided. Return in 3 months.

## 2014-05-06 ENCOUNTER — Telehealth: Payer: Self-pay | Admitting: *Deleted

## 2014-05-06 MED ORDER — DICYCLOMINE HCL 10 MG PO CAPS: ORAL_CAPSULE | ORAL | Status: DC

## 2014-05-06 NOTE — Telephone Encounter (Signed)
Patient requesting refills on Bentyl 10 mg ( Dicyclomine).  Per Amy Esterwood PA-C ok to do # 90 with 3 refills. Patient advised.

## 2014-07-07 ENCOUNTER — Other Ambulatory Visit: Payer: Self-pay | Admitting: Internal Medicine

## 2014-07-24 ENCOUNTER — Encounter: Payer: Self-pay | Admitting: Family Medicine

## 2014-07-24 NOTE — Progress Notes (Signed)
Patient ID: Gina Jefferson, female   DOB: 05-Jun-1951, 63 y.o.   MRN: [id number] Reviewed: Agree with the documentation and management by my [place].

## 2014-07-29 ENCOUNTER — Ambulatory Visit (INDEPENDENT_AMBULATORY_CARE_PROVIDER_SITE_OTHER): Payer: 59 | Admitting: Podiatry

## 2014-07-29 ENCOUNTER — Encounter: Payer: Self-pay | Admitting: Podiatry

## 2014-07-29 VITALS — BP 131/78 | HR 68 | Ht 62.0 in | Wt 160.0 lb

## 2014-07-29 DIAGNOSIS — L600 Ingrowing nail: Secondary | ICD-10-CM

## 2014-07-29 DIAGNOSIS — M79605 Pain in left leg: Secondary | ICD-10-CM

## 2014-07-29 DIAGNOSIS — M79609 Pain in unspecified limb: Secondary | ICD-10-CM

## 2014-07-29 NOTE — Progress Notes (Signed)
Subjective:  63 year old female presents complaining of painful ingrown toe nail on left great toe. Patient also wants to have her calluses trimmed from balls of both feet.  Objective:  Ingrown nail medial border left great toe without infection or inflammation. No drainage noted. Plantar callus under 2nd MPJ area bilateral. No other problems noted.   Assessment: Painful ingrown nail left great toe without infection. Mild plantar callus balls of both feet.  Plan:  Painful ingrown left great toe nail debrided.  Calluses debrided. Return in 3 months or as needed.

## 2014-07-29 NOTE — Patient Instructions (Signed)
Seen for painful left great toe nail and calluses. All debrided. Return as needed.

## 2014-08-11 ENCOUNTER — Encounter: Payer: Self-pay | Admitting: Internal Medicine

## 2014-08-30 IMAGING — CR DG LUMBAR SPINE 2-3V
3 series · 3 of 3 positions shown · non-contrast
Comparison: None.

CLINICAL DATA: Right-sided lower thoracic and upper lumbar pain.

LUMBAR SPINE - 2-3 VIEW

[view not recorded (1 of 3)]
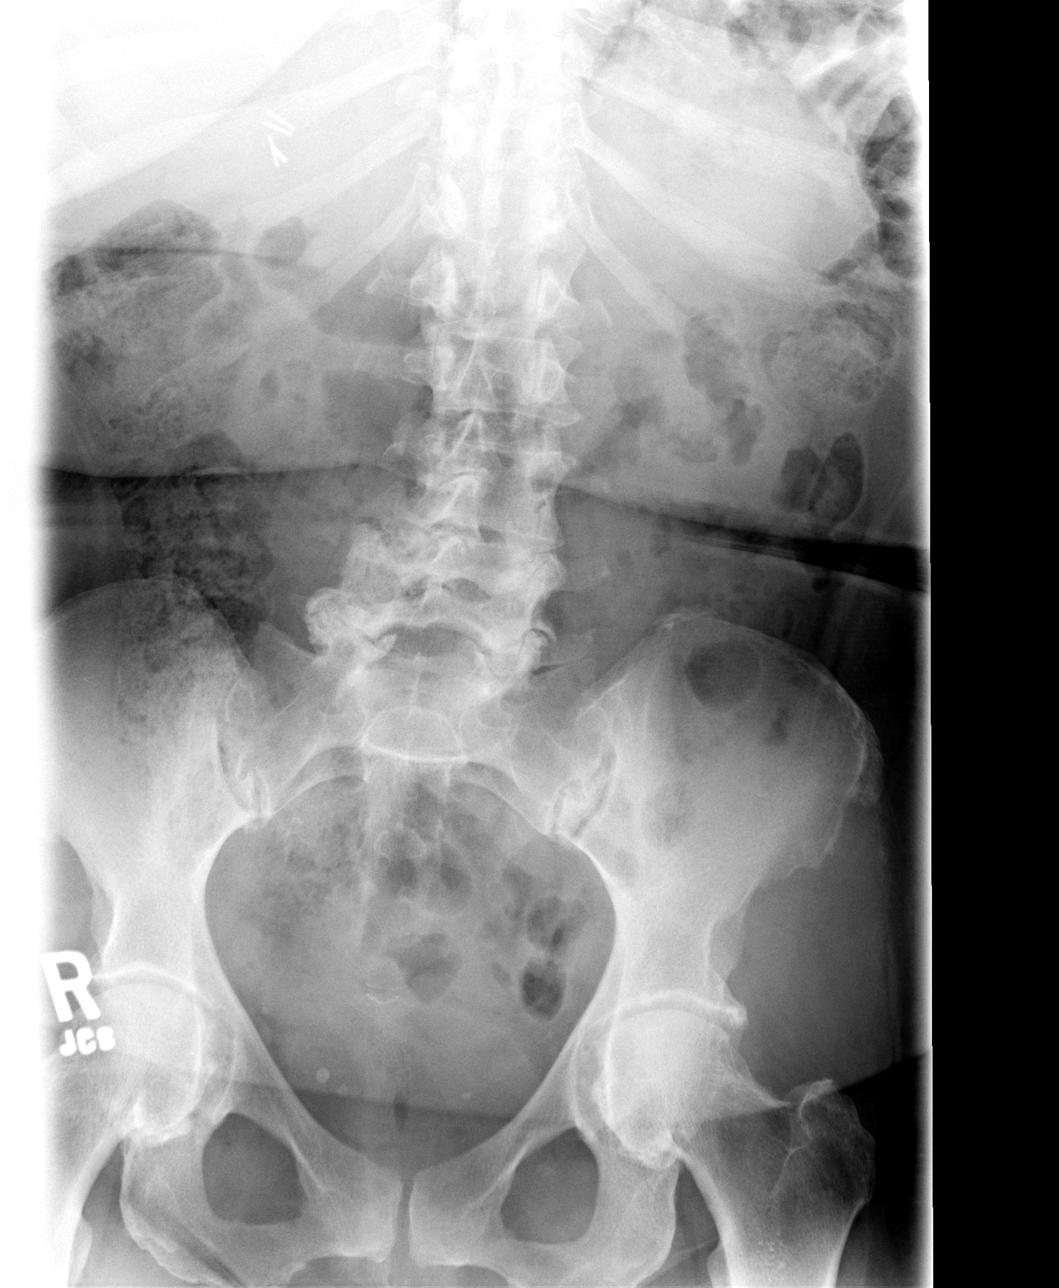

[view not recorded (2 of 3)]
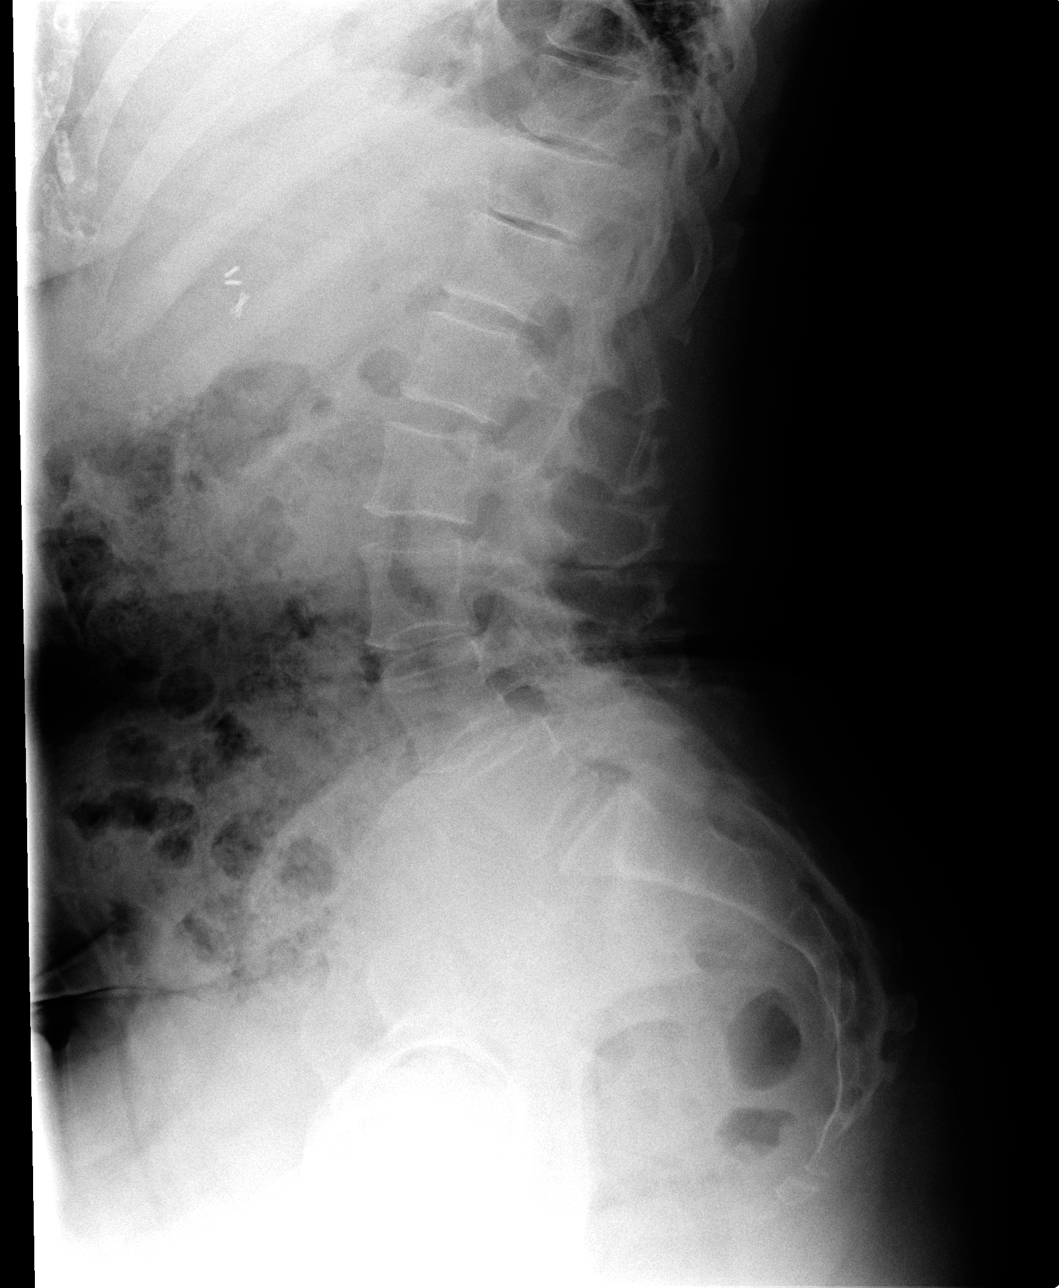

[view not recorded (3 of 3)]
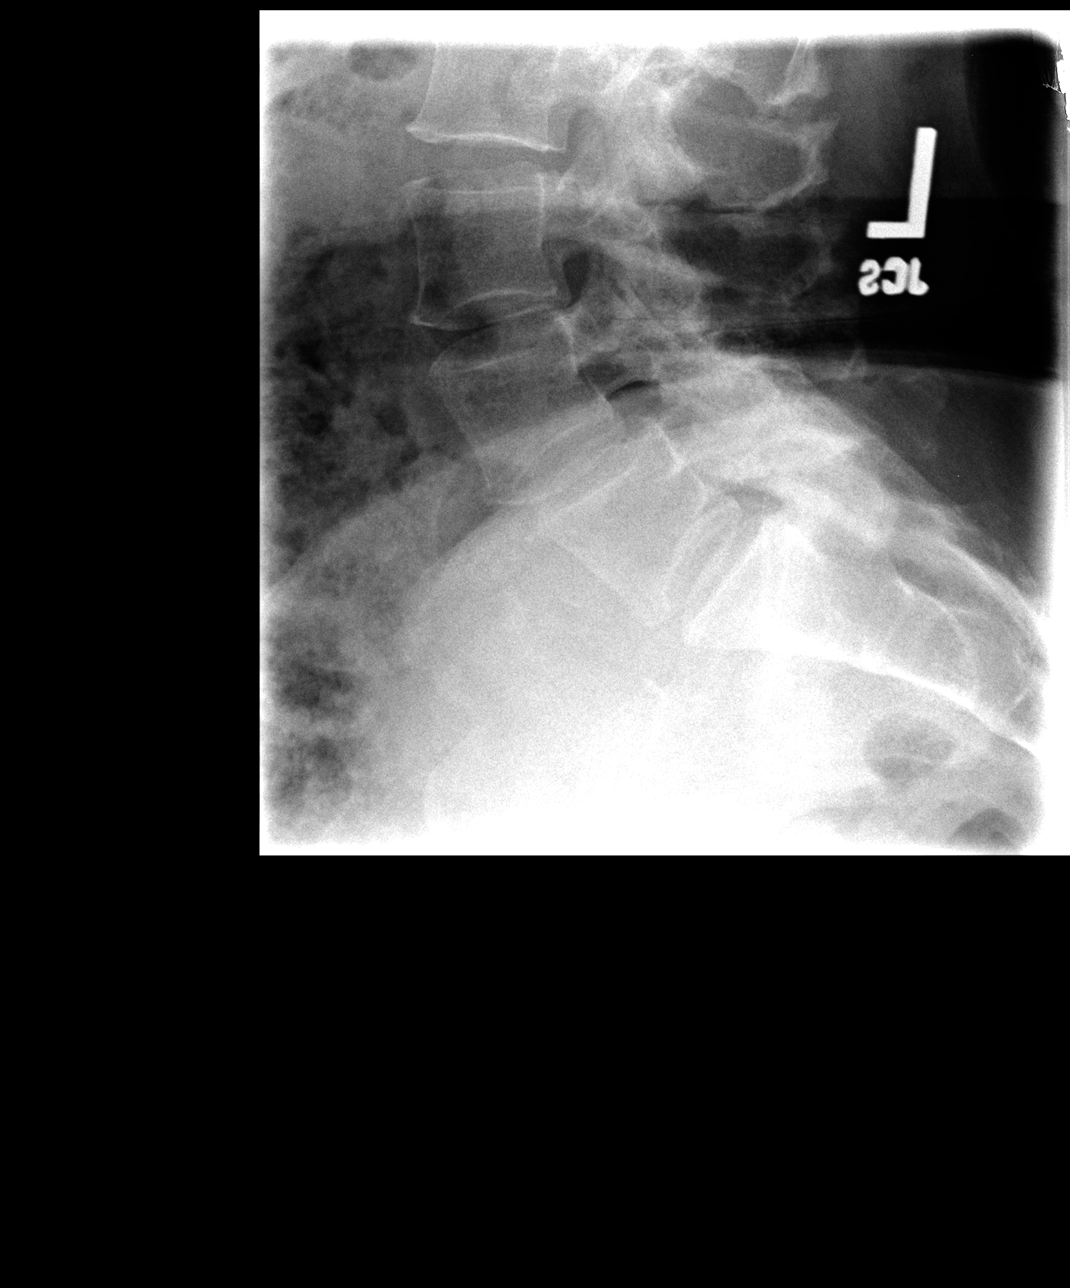

[3 of 3 positions shown; findings below may reference images not displayed]

FINDINGS: There is a slight rotoscoliosis of the lumbar spine.
There is narrowing of the disc spaces at T10-11 and T11-12.  No
fractures or bone destruction.

The patient has severe degenerative facet arthritis at L3-4, L4-5,
and at L5-S1.  There is grade 1 spondylolisthesis of L4 on L5.  No
disc space narrowing in the lumbar spine.
IMPRESSION: Scoliosis with severe degenerative facet arthritis in
the lower lumbar spine with grade 1 spondylolisthesis of L4 on L5.

## 2014-08-30 IMAGING — CR DG THORACIC SPINE 2V
3 series · 3 of 3 positions shown · non-contrast
Comparison: Chest x-ray dated 06/13/2008

CLINICAL DATA: Right lower thoracic back pain.

THORACIC SPINE - 2 VIEW

[view not recorded (1 of 3)]
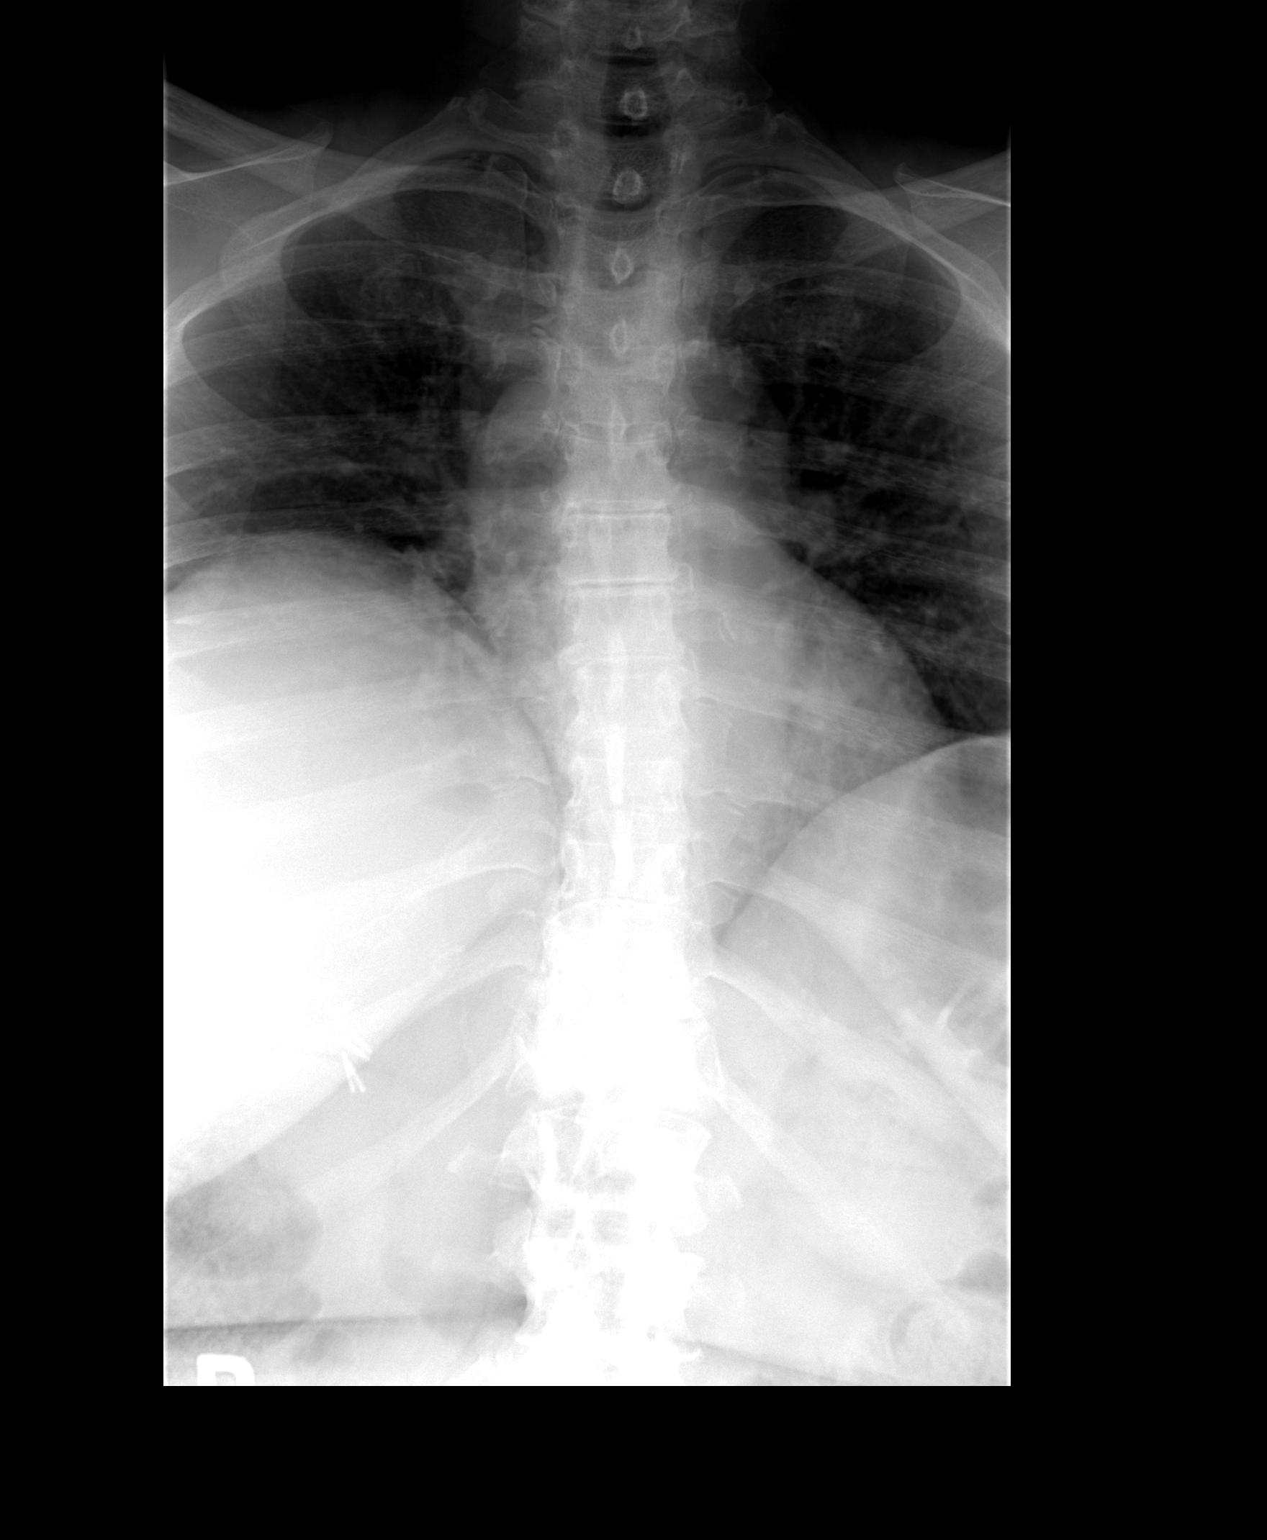

[view not recorded (2 of 3)]
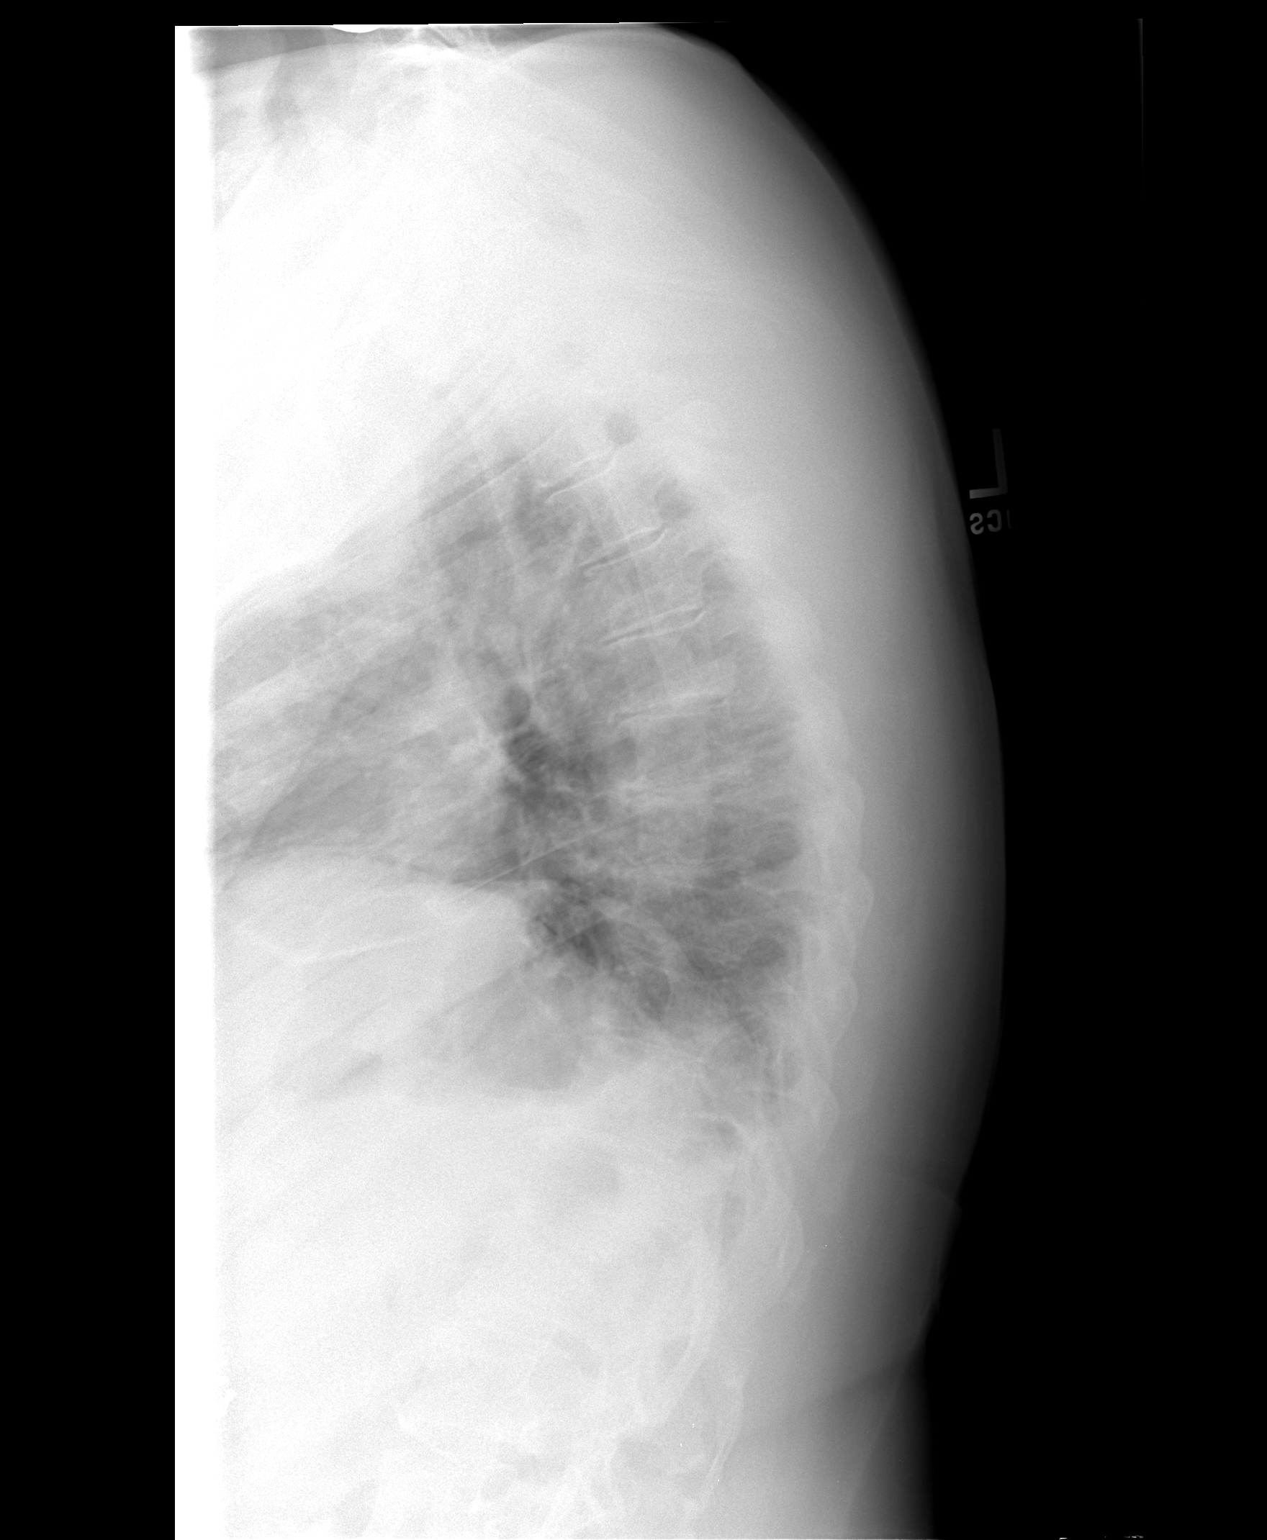

[view not recorded (3 of 3)]
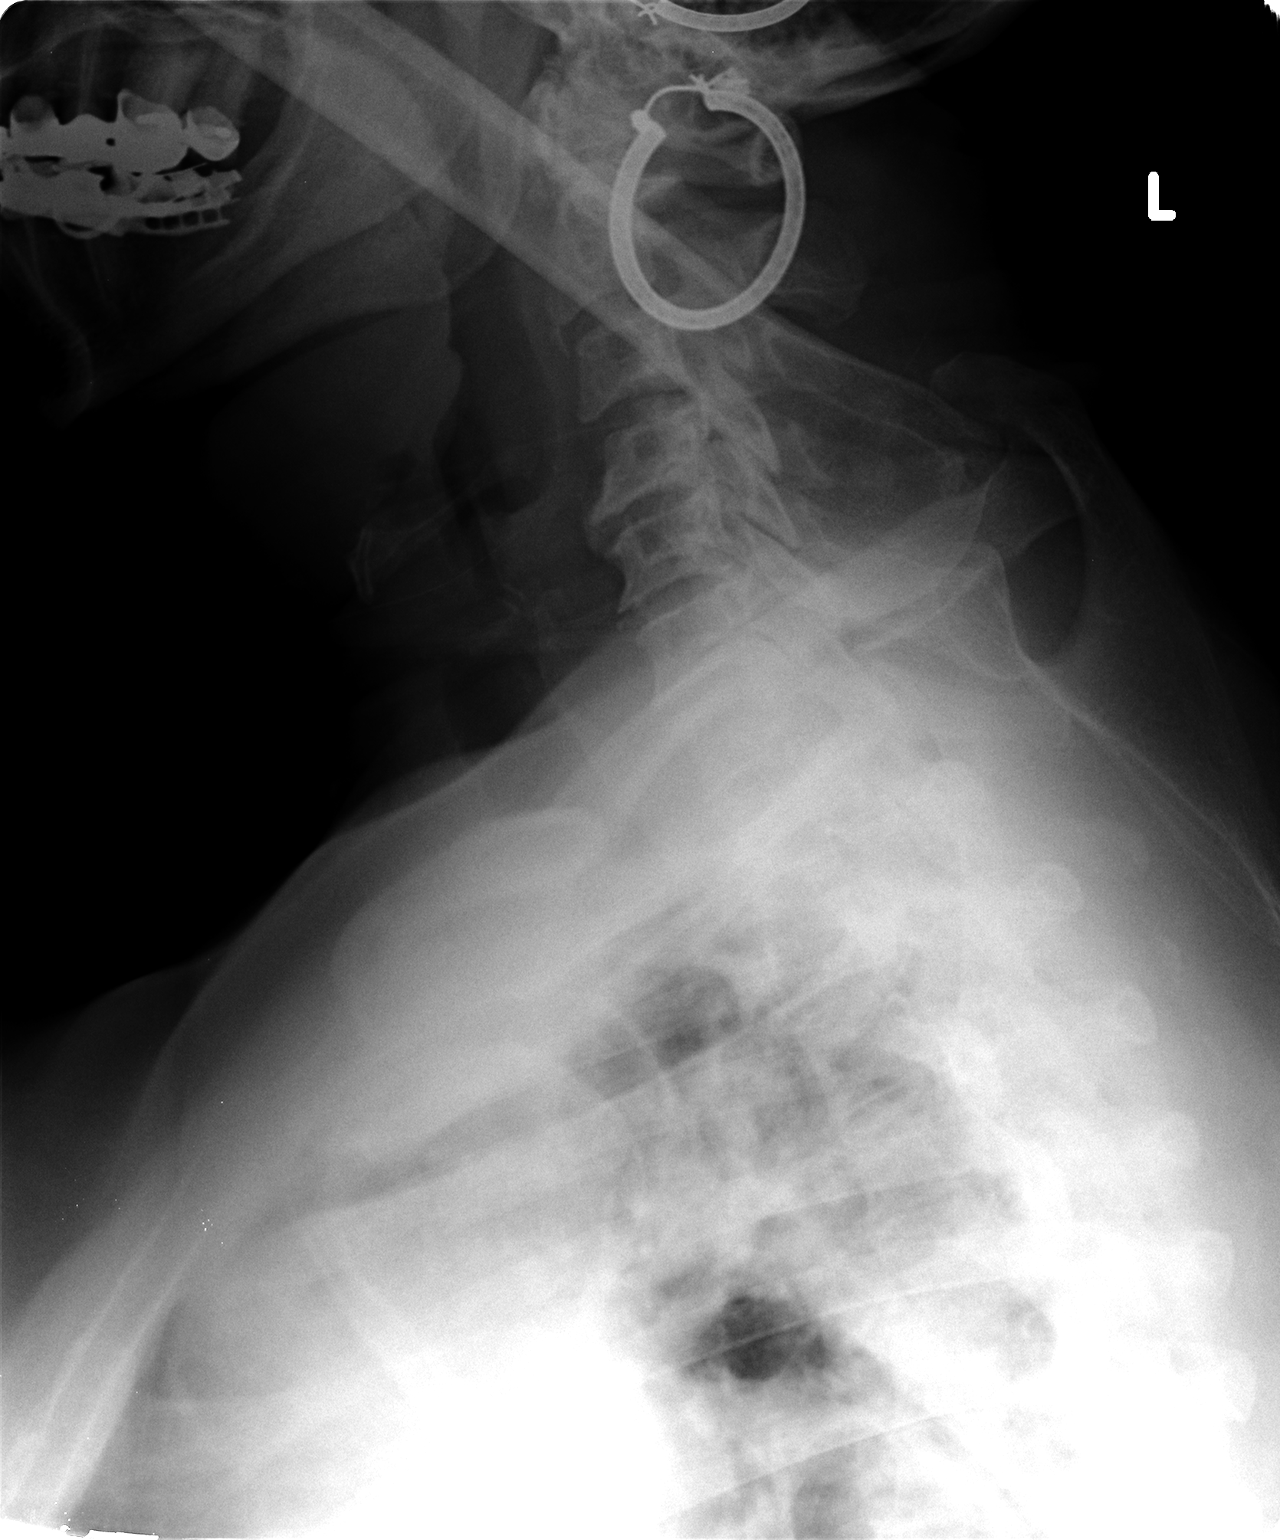

[3 of 3 positions shown; findings below may reference images not displayed]

FINDINGS: There is a slight thoracolumbar scoliosis.  No bone
destruction or mass lesions.  No fractures.  There is disc space
narrowing at multiple levels.
IMPRESSION: No acute abnormalities.  Multilevel degenerative disc disease.
Slight scoliosis.

## 2014-09-12 ENCOUNTER — Other Ambulatory Visit: Payer: Self-pay

## 2014-10-31 ENCOUNTER — Ambulatory Visit (INDEPENDENT_AMBULATORY_CARE_PROVIDER_SITE_OTHER): Payer: 59 | Admitting: Podiatry

## 2014-10-31 ENCOUNTER — Encounter: Payer: Self-pay | Admitting: Podiatry

## 2014-10-31 VITALS — BP 139/80 | HR 67

## 2014-10-31 DIAGNOSIS — L600 Ingrowing nail: Secondary | ICD-10-CM

## 2014-10-31 NOTE — Patient Instructions (Signed)
Ingrown nail surgery done on left great toe. Follow soaking instruction. Return in one week.

## 2014-10-31 NOTE — Progress Notes (Signed)
Patient complaining of ingrown nail and request toe nail surgery.  Consent form reviewed.  Affected toe was anesthetized with total 41ml mixture of 50/50 0.5% Marcaine plain and 1% Xylocaine plain. Left great toe medial border was reflected with a nail elevator and excised with nail nipper. Proximal nail matrix tissue was cauterized with Phenol soaked cotton applicator x 4 and neutralized with Alcohol soaked cotton applicator. The wound was dressed with Amerigel ointment dressing. Home care instructions and supply dispensed.  Return in 1 week for follow up.

## 2014-11-07 ENCOUNTER — Ambulatory Visit (INDEPENDENT_AMBULATORY_CARE_PROVIDER_SITE_OTHER): Payer: 59 | Admitting: Podiatry

## 2014-11-07 ENCOUNTER — Encounter: Payer: Self-pay | Admitting: Podiatry

## 2014-11-07 DIAGNOSIS — L600 Ingrowing nail: Secondary | ICD-10-CM

## 2014-11-07 NOTE — Progress Notes (Signed)
One week follow up on nail surgery. No complaints. Nail border is clean and dry.  Wound healing is normal and satisfactory. Continue to soak till tenderness subside.

## 2014-11-07 NOTE — Patient Instructions (Signed)
Doing well following toe nail surgery. Return in 3 months for Routine foot care.

## 2014-11-13 ENCOUNTER — Other Ambulatory Visit: Payer: Self-pay | Admitting: Obstetrics and Gynecology

## 2014-11-17 LAB — CYTOLOGY - PAP

## 2014-11-28 HISTORY — PX: BREAST REDUCTION SURGERY: SHX8

## 2014-12-08 ENCOUNTER — Ambulatory Visit (HOSPITAL_BASED_OUTPATIENT_CLINIC_OR_DEPARTMENT_OTHER): Admit: 2014-12-08 | Payer: 59 | Admitting: Specialist

## 2014-12-08 ENCOUNTER — Encounter (HOSPITAL_BASED_OUTPATIENT_CLINIC_OR_DEPARTMENT_OTHER): Payer: Self-pay

## 2014-12-08 SURGERY — MAMMOPLASTY, REDUCTION
Anesthesia: General

## 2014-12-12 ENCOUNTER — Other Ambulatory Visit: Payer: Self-pay

## 2015-02-06 ENCOUNTER — Ambulatory Visit (INDEPENDENT_AMBULATORY_CARE_PROVIDER_SITE_OTHER): Payer: 59 | Admitting: Podiatry

## 2015-02-06 ENCOUNTER — Encounter: Payer: Self-pay | Admitting: Podiatry

## 2015-02-06 VITALS — BP 150/86 | HR 87

## 2015-02-06 DIAGNOSIS — L600 Ingrowing nail: Secondary | ICD-10-CM

## 2015-02-06 DIAGNOSIS — M6240 Contracture of muscle, unspecified site: Secondary | ICD-10-CM | POA: Diagnosis not present

## 2015-02-06 NOTE — Progress Notes (Signed)
Subjective: 64 year old female presents 3 months following left great toe medial border ingrown nail surgery. Patient wants to have a follow up on the toe.   Objective: Nail appear to be free of ingrown nail. Noted of contracted Achilles tendon on right that needs to stretched through exercise. Left great toe has tight tendon that may cause recurring ingrown nail or pain on the great toe. Normal blood flow in lower limbs.   Assessment: Contracted Achilles tendon right. Contracted EHL left with cocked up hallux. Diabetic under control.  Plan:  Reviewed findings and available treatment options.  Return if painful or as needed.

## 2015-02-06 NOTE — Patient Instructions (Signed)
3 months following left great toe medial border ingrown nail surgery. Nail appear to be free of ingrown nail. Noted of contracted Achilles tendon on right that needs to stretched through exercise. Left great toe has tight tendon that may cause recurring ingrown nail or pain on the great toe. Return if painful or as needed.

## 2015-02-12 ENCOUNTER — Other Ambulatory Visit (INDEPENDENT_AMBULATORY_CARE_PROVIDER_SITE_OTHER): Payer: 59

## 2015-02-12 ENCOUNTER — Ambulatory Visit (INDEPENDENT_AMBULATORY_CARE_PROVIDER_SITE_OTHER): Payer: 59 | Admitting: Internal Medicine

## 2015-02-12 ENCOUNTER — Encounter: Payer: Self-pay | Admitting: Internal Medicine

## 2015-02-12 VITALS — BP 128/84 | HR 92 | Temp 98.6°F | Resp 18 | Ht 61.0 in | Wt 155.0 lb

## 2015-02-12 DIAGNOSIS — Z0000 Encounter for general adult medical examination without abnormal findings: Secondary | ICD-10-CM | POA: Insufficient documentation

## 2015-02-12 DIAGNOSIS — I10 Essential (primary) hypertension: Secondary | ICD-10-CM

## 2015-02-12 DIAGNOSIS — E785 Hyperlipidemia, unspecified: Secondary | ICD-10-CM

## 2015-02-12 DIAGNOSIS — F4321 Adjustment disorder with depressed mood: Secondary | ICD-10-CM

## 2015-02-12 DIAGNOSIS — E119 Type 2 diabetes mellitus without complications: Secondary | ICD-10-CM

## 2015-02-12 LAB — LIPID PANEL
Cholesterol: 249 mg/dL — ABNORMAL HIGH (ref 0–200)
HDL: 49.5 mg/dL (ref >39.00)
LDL CALC: 173 mg/dL — AB (ref 0–99)
NonHDL: 199.5
TRIGLYCERIDES: 132 mg/dL (ref 0.0–149.0)
Total CHOL/HDL Ratio: 5
VLDL: 26.4 mg/dL (ref 0.0–40.0)

## 2015-02-12 LAB — BASIC METABOLIC PANEL
BUN: 14 mg/dL (ref 6–23)
CHLORIDE: 106 meq/L (ref 96–112)
CO2: 30 mEq/L (ref 19–32)
Calcium: 9.6 mg/dL (ref 8.4–10.5)
Creatinine, Ser: 0.84 mg/dL (ref 0.40–1.20)
GFR: 88.04 mL/min (ref >60.00)
GLUCOSE: 90 mg/dL (ref 70–99)
Potassium: 4 mEq/L (ref 3.5–5.1)
Sodium: 140 mEq/L (ref 135–145)

## 2015-02-12 LAB — HEMOGLOBIN A1C: HEMOGLOBIN A1C: 5.9 % (ref 4.6–6.5)

## 2015-02-12 MED ORDER — FLUCONAZOLE 150 MG PO TABS: 150.0000 mg | ORAL_TABLET | Freq: Once | ORAL | Status: DC

## 2015-02-12 NOTE — Assessment & Plan Note (Signed)
BP well controlled on exforge. Check labs, continue unless change indicated.

## 2015-02-12 NOTE — Assessment & Plan Note (Signed)
Check lipid panel, not on medication now. Advised exercise regularly to help her cholesterol and blood pressure. She will work on it.

## 2015-02-12 NOTE — Assessment & Plan Note (Signed)
Still having some grief over her son's murder. She uses zoloft when she is in times of stress.

## 2015-02-12 NOTE — Assessment & Plan Note (Signed)
Colonoscopy due in 2020. Tetanus due this year but she will wait. Due for prevnar as well and she would like to wait. Has had flu shot already. Needs mammogram this fall. Has had shingles vaccine.

## 2015-02-12 NOTE — Patient Instructions (Signed)
We have sent in diflucan today for you. If you start getting a yeast infection take 1 pill, if you are still having symptoms in 2 days take the other pill.   We will check the blood work today and call you back with the results.  Keep up the good work but work on exercising more regularly to help keep your heart, blood pressure and cholesterol good.   You are due for the pneumonia and the tetanus shot at some time. Think about getting those in the next 1-2 years.   Health Maintenance Adopting a healthy lifestyle and getting preventive care can go a long way to promote health and wellness. Talk with your health care provider about what schedule of regular examinations is right for you. This is a good chance for you to check in with your provider about disease prevention and staying healthy. In between checkups, there are plenty of things you can do on your own. Experts have done a lot of research about which lifestyle changes and preventive measures are most likely to keep you healthy. Ask your health care provider for more information. WEIGHT AND DIET  Eat a healthy diet  Be sure to include plenty of vegetables, fruits, low-fat dairy products, and lean protein.  Do not eat a lot of foods high in solid fats, added sugars, or salt.  Get regular exercise. This is one of the most important things you can do for your health.  Most adults should exercise for at least 150 minutes each week. The exercise should increase your heart rate and make you sweat (moderate-intensity exercise).  Most adults should also do strengthening exercises at least twice a week. This is in addition to the moderate-intensity exercise.  Maintain a healthy weight  Body mass index (BMI) is a measurement that can be used to identify possible weight problems. It estimates body fat based on height and weight. Your health care provider can help determine your BMI and help you achieve or maintain a healthy weight.  For  females 75 years of age and older:   A BMI below 18.5 is considered underweight.  A BMI of 18.5 to 24.9 is normal.  A BMI of 25 to 29.9 is considered overweight.  A BMI of 30 and above is considered obese.  Watch levels of cholesterol and blood lipids  You should start having your blood tested for lipids and cholesterol at 63 years of age, then have this test every 5 years.  You may need to have your cholesterol levels checked more often if:  Your lipid or cholesterol levels are high.  You are older than 63 years of age.  You are at high risk for heart disease.  CANCER SCREENING   Lung Cancer  Lung cancer screening is recommended for adults 74-51 years old who are at high risk for lung cancer because of a history of smoking.  A yearly low-dose CT scan of the lungs is recommended for people who:  Currently smoke.  Have quit within the past 15 years.  Have at least a 30-pack-year history of smoking. A pack year is smoking an average of one pack of cigarettes a day for 1 year.  Yearly screening should continue until it has been 15 years since you quit.  Yearly screening should stop if you develop a health problem that would prevent you from having lung cancer treatment.  Breast Cancer  Practice breast self-awareness. This means understanding how your breasts normally appear and feel.  It  also means doing regular breast self-exams. Let your health care provider know about any changes, no matter how small.  If you are in your 20s or 30s, you should have a clinical breast exam (CBE) by a health care provider every 1-3 years as part of a regular health exam.  If you are 62 or older, have a CBE every year. Also consider having a breast X-ray (mammogram) every year.  If you have a family history of breast cancer, talk to your health care provider about genetic screening.  If you are at high risk for breast cancer, talk to your health care provider about having an MRI and  a mammogram every year.  Breast cancer gene (BRCA) assessment is recommended for women who have family members with BRCA-related cancers. BRCA-related cancers include:  Breast.  Ovarian.  Tubal.  Peritoneal cancers.  Results of the assessment will determine the need for genetic counseling and BRCA1 and BRCA2 testing. Cervical Cancer Routine pelvic examinations to screen for cervical cancer are no longer recommended for nonpregnant women who are considered low risk for cancer of the pelvic organs (ovaries, uterus, and vagina) and who do not have symptoms. A pelvic examination may be necessary if you have symptoms including those associated with pelvic infections. Ask your health care provider if a screening pelvic exam is right for you.   The Pap test is the screening test for cervical cancer for women who are considered at risk.  If you had a hysterectomy for a problem that was not cancer or a condition that could lead to cancer, then you no longer need Pap tests.  If you are older than 65 years, and you have had normal Pap tests for the past 10 years, you no longer need to have Pap tests.  If you have had past treatment for cervical cancer or a condition that could lead to cancer, you need Pap tests and screening for cancer for at least 20 years after your treatment.  If you no longer get a Pap test, assess your risk factors if they change (such as having a new sexual partner). This can affect whether you should start being screened again.  Some women have medical problems that increase their chance of getting cervical cancer. If this is the case for you, your health care provider may recommend more frequent screening and Pap tests.  The human papillomavirus (HPV) test is another test that may be used for cervical cancer screening. The HPV test looks for the virus that can cause cell changes in the cervix. The cells collected during the Pap test can be tested for HPV.  The HPV test can  be used to screen women 40 years of age and older. Getting tested for HPV can extend the interval between normal Pap tests from three to five years.  An HPV test also should be used to screen women of any age who have unclear Pap test results.  After 63 years of age, women should have HPV testing as often as Pap tests.  Colorectal Cancer  This type of cancer can be detected and often prevented.  Routine colorectal cancer screening usually begins at 63 years of age and continues through 63 years of age.  Your health care provider may recommend screening at an earlier age if you have risk factors for colon cancer.  Your health care provider may also recommend using home test kits to check for hidden blood in the stool.  A small camera at the end  of a tube can be used to examine your colon directly (sigmoidoscopy or colonoscopy). This is done to check for the earliest forms of colorectal cancer.  Routine screening usually begins at age 38.  Direct examination of the colon should be repeated every 5-10 years through 63 years of age. However, you may need to be screened more often if early forms of precancerous polyps or small growths are found. Skin Cancer  Check your skin from head to toe regularly.  Tell your health care provider about any new moles or changes in moles, especially if there is a change in a mole's shape or color.  Also tell your health care provider if you have a mole that is larger than the size of a pencil eraser.  Always use sunscreen. Apply sunscreen liberally and repeatedly throughout the day.  Protect yourself by wearing long sleeves, pants, a wide-brimmed hat, and sunglasses whenever you are outside. HEART DISEASE, DIABETES, AND HIGH BLOOD PRESSURE   Have your blood pressure checked at least every 1-2 years. High blood pressure causes heart disease and increases the risk of stroke.  If you are between 29 years and 60 years old, ask your health care provider if  you should take aspirin to prevent strokes.  Have regular diabetes screenings. This involves taking a blood sample to check your fasting blood sugar level.  If you are at a normal weight and have a low risk for diabetes, have this test once every three years after 63 years of age.  If you are overweight and have a high risk for diabetes, consider being tested at a younger age or more often. PREVENTING INFECTION  Hepatitis B  If you have a higher risk for hepatitis B, you should be screened for this virus. You are considered at high risk for hepatitis B if:  You were born in a country where hepatitis B is common. Ask your health care provider which countries are considered high risk.  Your parents were born in a high-risk country, and you have not been immunized against hepatitis B (hepatitis B vaccine).  You have HIV or AIDS.  You use needles to inject street drugs.  You live with someone who has hepatitis B.  You have had sex with someone who has hepatitis B.  You get hemodialysis treatment.  You take certain medicines for conditions, including cancer, organ transplantation, and autoimmune conditions. Hepatitis C  Blood testing is recommended for:  Everyone born from [birth year] through 1964.  Anyone with known risk factors for hepatitis C. Sexually transmitted infections (STIs)  You should be screened for sexually transmitted infections (STIs) including gonorrhea and chlamydia if:  You are sexually active and are younger than 63 years of age.  You are older than 63 years of age and your health care provider tells you that you are at risk for this type of infection.  Your sexual activity has changed since you were last screened and you are at an increased risk for chlamydia or gonorrhea. Ask your health care provider if you are at risk.  If you do not have HIV, but are at risk, it may be recommended that you take a prescription medicine daily to prevent HIV infection. This is  called pre-exposure prophylaxis (PrEP). You are considered at risk if:  You are sexually active and do not regularly use condoms or know the HIV status of your partner(s).  You take drugs by injection.  You are sexually active with a partner who has HIV.  Talk with your health care provider about whether you are at high risk of being infected with HIV. If you choose to begin PrEP, you should first be tested for HIV. You should then be tested every 3 months for as long as you are taking PrEP.  PREGNANCY   If you are premenopausal and you may become pregnant, ask your health care provider about preconception counseling.  If you may become pregnant, take 400 to 800 micrograms (mcg) of folic acid every day.  If you want to prevent pregnancy, talk to your health care provider about birth control (contraception). OSTEOPOROSIS AND MENOPAUSE   Osteoporosis is a disease in which the bones lose minerals and strength with aging. This can result in serious bone fractures. Your risk for osteoporosis can be identified using a bone density scan.  If you are 24 years of age or older, or if you are at risk for osteoporosis and fractures, ask your health care provider if you should be screened.  Ask your health care provider whether you should take a calcium or vitamin D supplement to lower your risk for osteoporosis.  Menopause may have certain physical symptoms and risks.  Hormone replacement therapy may reduce some of these symptoms and risks. Talk to your health care provider about whether hormone replacement therapy is right for you.  HOME CARE INSTRUCTIONS   Schedule regular health, dental, and eye exams.  Stay current with your immunizations.   Do not use any tobacco products including cigarettes, chewing tobacco, or electronic cigarettes.  If you are pregnant, do not drink alcohol.  If you are breastfeeding, limit how much and how often you drink alcohol.  Limit alcohol intake to no more  than 1 drink per day for nonpregnant women. One drink equals 12 ounces of beer, 5 ounces of wine, or 1 ounces of hard liquor.  Do not use street drugs.  Do not share needles.  Ask your health care provider for help if you need support or information about quitting drugs.  Tell your health care provider if you often feel depressed.  Tell your health care provider if you have ever been abused or do not feel safe at home. Document Released: 05/30/2011 Document Revised: 03/31/2014 Document Reviewed: 10/16/2013 [facility] Patient Information 2015 [place], [place]. This information is not intended to replace advice given to you by your health care provider. Make sure you discuss any questions you have with your health care provider.

## 2015-02-12 NOTE — Assessment & Plan Note (Signed)
On ARB, not on medication at this time. Check HgA1c. Needs eye exam, foot exam up to date. Will defer urine microalbumin for now as not active diabetes and on ARB.

## 2015-02-12 NOTE — Progress Notes (Signed)
Pre visit review using our clinic review tool, if applicable. No additional management support is needed unless otherwise documented below in the visit note. 

## 2015-02-12 NOTE — Progress Notes (Signed)
   Subjective:    Patient ID: Gina Jefferson, female    DOB: 1951/10/12, 64 y.o.   MRN: [id number]  HPI The patient is a 64 YO female who is coming in today for wellness. She is currently being treated for a UTI and wants to know if she can have fluconazole as she always gets a yeast infection with antibiotics. She is taking probiotic. She is a former smoker and does not regularly exercise right now. Recently had a breast reduction which has helped a lot with her back pains.   PMH, [facility], social history reviewed and updated during today's visit.   Review of Systems  Constitutional: Negative for fever, activity change, appetite change, fatigue and unexpected weight change.  HENT: Negative.   Eyes: Negative.   Respiratory: Negative for cough, chest tightness, shortness of breath and wheezing.   Cardiovascular: Negative for chest pain, palpitations and leg swelling.  Gastrointestinal: Negative for nausea, abdominal pain, diarrhea, constipation and abdominal distention.  Musculoskeletal: Positive for arthralgias. Negative for myalgias, back pain and gait problem.       Rare  Skin: Negative.   Neurological: Negative.       Objective:   Physical Exam  Constitutional: She is oriented to person, place, and time. She appears well-developed and well-nourished.  HENT:  Head: Normocephalic and atraumatic.  Eyes: EOM are normal.  Neck: Normal range of motion.  Cardiovascular: Normal rate and regular rhythm.   Pulmonary/Chest: Effort normal and breath sounds normal. No respiratory distress.  Abdominal: Soft. Bowel sounds are normal.  Musculoskeletal: She exhibits no edema.  Neurological: She is alert and oriented to person, place, and time. Coordination normal.  Skin: Skin is warm and dry.  Psychiatric: She has a normal mood and affect.   Filed Vitals:   02/12/15 1022  BP: 128/84  Pulse: 92  Temp: 98.6 F (37 C)  TempSrc: Oral  Resp: 18  Height: 5\' 1"  (1.549 m)  Weight: 155 lb (70.308  kg)  SpO2: 94%      Assessment & Plan:

## 2015-02-27 ENCOUNTER — Ambulatory Visit (INDEPENDENT_AMBULATORY_CARE_PROVIDER_SITE_OTHER): Payer: 59 | Admitting: *Deleted

## 2015-02-27 DIAGNOSIS — Z23 Encounter for immunization: Secondary | ICD-10-CM

## 2015-03-11 ENCOUNTER — Other Ambulatory Visit: Payer: Self-pay | Admitting: Internal Medicine

## 2015-03-16 ENCOUNTER — Encounter: Payer: Self-pay | Admitting: Internal Medicine

## 2015-03-16 ENCOUNTER — Other Ambulatory Visit: Payer: Self-pay | Admitting: Internal Medicine

## 2015-03-18 ENCOUNTER — Telehealth: Payer: Self-pay | Admitting: *Deleted

## 2015-03-18 MED ORDER — FLUCONAZOLE 150 MG PO TABS: 150.0000 mg | ORAL_TABLET | Freq: Once | ORAL | Status: DC

## 2015-03-18 NOTE — Telephone Encounter (Signed)
Notified pt rx sent to pharmacy../lmb 

## 2015-03-18 NOTE — Telephone Encounter (Signed)
Refill sent in

## 2015-03-18 NOTE — Telephone Encounter (Signed)
Pt states she is needing a refill on the diflucan that md gave her at her ov. She was taking cipro med gave her yeast infection. Took first diflucan sxs did not clear up. Is it ok to refill...Johny Chess

## 2015-05-08 ENCOUNTER — Other Ambulatory Visit: Payer: Self-pay | Admitting: Internal Medicine

## 2015-05-22 LAB — HM DIABETES EYE EXAM

## 2015-05-25 ENCOUNTER — Other Ambulatory Visit: Payer: Self-pay

## 2015-07-09 ENCOUNTER — Other Ambulatory Visit: Payer: Self-pay

## 2015-07-20 ENCOUNTER — Encounter: Payer: Self-pay | Admitting: Internal Medicine

## 2015-07-20 ENCOUNTER — Ambulatory Visit (AMBULATORY_SURGERY_CENTER): Payer: 59 | Admitting: Internal Medicine

## 2015-07-20 VITALS — BP 134/83 | HR 66 | Temp 96.6°F | Resp 18 | Ht 61.0 in | Wt 155.0 lb

## 2015-07-20 DIAGNOSIS — K621 Rectal polyp: Secondary | ICD-10-CM

## 2015-07-20 DIAGNOSIS — D128 Benign neoplasm of rectum: Secondary | ICD-10-CM

## 2015-07-20 DIAGNOSIS — Z86010 Personal history of colonic polyps: Secondary | ICD-10-CM | POA: Diagnosis not present

## 2015-07-20 DIAGNOSIS — D129 Benign neoplasm of anus and anal canal: Secondary | ICD-10-CM

## 2015-07-20 MED ORDER — SODIUM CHLORIDE 0.9 % IV SOLN: 500.0000 mL | INTRAVENOUS | Status: DC

## 2015-07-20 NOTE — Progress Notes (Signed)
90 OPA inserted d/t persistent a/w obstruction. Resolved

## 2015-07-20 NOTE — Patient Instructions (Addendum)
I found and removed one tiny rectal polyp that looks benign.  You also have a condition called diverticulosis - common and not usually a problem. Please read the handout provided.  I will let you know pathology results and when to have another routine colonoscopy by mail.  I appreciate the opportunity to care for you. Gatha Mayer, MD, FACG     YOU HAD AN ENDOSCOPIC PROCEDURE TODAY AT [facility]:   Refer to the procedure report that was given to you for any specific questions about what was found during the examination.  If the procedure report does not answer your questions, please call your gastroenterologist to clarify.  If you requested that your care partner not be given the details of your procedure findings, then the procedure report has been included in a sealed envelope for you to review at your convenience later.  YOU SHOULD EXPECT: Some feelings of bloating in the abdomen. Passage of more gas than usual.  Walking can help get rid of the air that was put into your GI tract during the procedure and reduce the bloating. If you had a lower endoscopy (such as a colonoscopy or flexible sigmoidoscopy) you may notice spotting of blood in your stool or on the toilet paper. If you underwent a bowel prep for your procedure, you may not have a normal bowel movement for a few days.  Please Note:  You might notice some irritation and congestion in your nose or some drainage.  This is from the oxygen used during your procedure.  There is no need for concern and it should clear up in a day or so.  SYMPTOMS TO REPORT IMMEDIATELY:   Following lower endoscopy (colonoscopy or flexible sigmoidoscopy):  Excessive amounts of blood in the stool  Significant tenderness or worsening of abdominal pains  Swelling of the abdomen that is new, acute  Fever of 100F or higher    For urgent or emergent issues, a gastroenterologist can be reached at any hour by calling [phone]  [phone].   DIET: Your first meal following the procedure should be a small meal and then it is ok to progress to your normal diet. Heavy or fried foods are harder to digest and may make you feel nauseous or bloated.  Likewise, meals heavy in dairy and vegetables can increase bloating.  Drink plenty of fluids but you should avoid alcoholic beverages for 24 hours.  ACTIVITY:  You should plan to take it easy for the rest of today and you should NOT DRIVE or use heavy machinery until tomorrow (because of the sedation medicines used during the test).    FOLLOW UP: Our staff will call the number listed on your records the next business day following your procedure to check on you and address any questions or concerns that you may have regarding the information given to you following your procedure. If we do not reach you, we will leave a message.  However, if you are feeling well and you are not experiencing any problems, there is no need to return our call.  We will assume that you have returned to your regular daily activities without incident.  If any biopsies were taken you will be contacted by phone or by letter within the next 1-3 weeks.  Please call us at [phone] if you have not heard about the biopsies in 3 weeks.    SIGNATURES/CONFIDENTIALITY: You and/or your care partner have signed paperwork which will be entered into  your electronic medical record.  These signatures attest to the fact that that the information above on your After Visit Summary has been reviewed and is understood.  Full responsibility of the confidentiality of this discharge information lies with you and/or your care-partner.   Resume medications. Information given on polyps and diverticulosis.

## 2015-07-20 NOTE — Op Note (Signed)
[place]  [address] [place], [address]   COLONOSCOPY PROCEDURE REPORT  PATIENT: Gina, Jefferson  MR#: [id number] BIRTHDATE: 1951/07/27 , 64  yrs. old GENDER: female ENDOSCOPIST: Gatha Mayer, MD, [facility] PROCEDURE DATE:  07/20/2015 PROCEDURE:   Colonoscopy, surveillance and Colonoscopy with snare polypectomy First Screening Colonoscopy - Avg.  risk and is 50 yrs.  old or older - No.  Prior Negative Screening - Now for repeat screening. N/A  History of Adenoma - Now for follow-up colonoscopy & has been > or = to 3 yrs.  Yes hx of adenoma.  Has been 3 or more years since last colonoscopy.  Polyps removed today? Yes Polyps Removed Today ASA CLASS:   Class III INDICATIONS:Surveillance due to prior colonic neoplasia and PH Colon Adenoma. MEDICATIONS: Propofol 200 mg IV and Monitored anesthesia care  DESCRIPTION OF PROCEDURE:   After the risks benefits and alternatives of the procedure were thoroughly explained, informed consent was obtained.  The digital rectal exam revealed no abnormalities of the rectum.   The LB [id number]  endoscope was introduced through the anus and advanced to the cecum, which was identified by both the appendix and ileocecal valve. No adverse events experienced.   The quality of the prep was good.  (MiraLax was used)  The instrument was then slowly withdrawn as the colon was fully examined. Estimated blood loss is zero unless otherwise noted in this procedure report.  COLON FINDINGS: A sessile polyp measuring 5 mm in size was found in the rectum.  A polypectomy was performed with a cold snare.  The resection was complete, the polyp tissue was completely retrieved and sent to histology.   There was diverticulosis noted in the left colon.   The examination was otherwise normal.  Retroflexed views revealed no abnormalities. The time to cecum = 4.3 Withdrawal time = 10.1   The scope was withdrawn and the procedure  completed. COMPLICATIONS: There were no immediate complications.  ENDOSCOPIC IMPRESSION: 1.   Sessile polyp was found in the rectum; polypectomy was performed with a cold snare 2.   Diverticulosis was noted in the left colon 3.   Normal colonoscopy otherwise good prep  RECOMMENDATIONS: Timing of repeat colonoscopy will be determined by pathology findings.  Hx 5 mm adenoma 2010  eSigned:  Gatha Mayer, MD, [facility] 07/20/2015 9:55 AM   cc: The Patient

## 2015-07-20 NOTE — Progress Notes (Signed)
Called to room to assist during endoscopic procedure.  Patient ID and intended procedure confirmed with present staff. Received instructions for my participation in the procedure from the performing physician.  

## 2015-07-20 NOTE — Progress Notes (Signed)
To recovery, report to Brown, RN, VSS. 

## 2015-07-21 ENCOUNTER — Telehealth: Payer: Self-pay | Admitting: *Deleted

## 2015-07-21 NOTE — Telephone Encounter (Signed)
  Follow up Call-  Call back number 07/20/2015  Post procedure Call Back phone  # [phone]  Permission to leave phone message Yes     Patient questions:  Message left to call us if necessary.

## 2015-07-27 ENCOUNTER — Encounter: Payer: Self-pay | Admitting: Internal Medicine

## 2015-07-27 NOTE — Progress Notes (Signed)
Quick Note:  hyp polyp Repeat colonoscopy 2026 ______

## 2015-09-09 ENCOUNTER — Telehealth: Payer: Self-pay | Admitting: Internal Medicine

## 2015-09-09 ENCOUNTER — Other Ambulatory Visit: Payer: Self-pay | Admitting: Internal Medicine

## 2015-09-09 MED ORDER — METHYLPREDNISOLONE 4 MG PO TBPK: ORAL_TABLET | ORAL | Status: DC

## 2015-09-09 NOTE — Telephone Encounter (Signed)
Medrol dose pak sent to her pharmacy

## 2015-09-09 NOTE — Telephone Encounter (Signed)
[facility] Patient Name: Gina Jefferson DOB: 03-14-1951 Initial Comment Caller states had a tooth pulled last Mon, was put on abx, having allergic reaction, face and chest red and splotched, face swollen Nurse Assessment Nurse: Mechele Dawley, RN, Amy Date/Time (Eastern Time): 09/09/2015 9:15:53 AM Confirm and document reason for call. If symptomatic, describe symptoms. ---CALLER STATES SHE IS HAVING ISSUES WITH FACE AND UPPER CHEST. SHE IS HAVING A BREAK OUT WITH RED WITH SPLOTCHES ON HER CHEST AND ON HER FACE SHE LOOKS LIKE BUTTERFLY. BOTH AREAS ARE RED AND ITCHY. NO FEVER. NO DIFFICULTY BREATHING. Has the patient traveled out of the country within the last 30 days? ---Not Applicable Does the patient have any new or worsening symptoms? ---Yes Will a triage be completed? ---Yes Related visit to physician within the last 2 weeks? ---No Does the PT have any chronic conditions? (i.e. diabetes, asthma, etc.) ---Yes List chronic conditions. ---HYPERTENSION Guidelines Guideline Title Affirmed Question Affirmed Notes Rash - Widespread On Drugs Taking new prescription medicine (EXCEPTIONs: finished taking new prescription antibiotic OR questions about flushing from niacin) Final Disposition User See Physician within 24 Hours [place], Therapist, sports, Amy Comments THIS PATIENT WORKS ON [place]. SHE WAS TOLD BY THE PA - AMY ESTERWOOD THAT SHE NEEDS A DOSE PACK. SHE IS REQUESTING TO HAVE CALLED IN FOR HER. SHE DID NOT WANT TO SCHEDULE AN APPT UNTIL SHE CAN SEE IF SHE COULD GET A DOSE PACK CALLED IN. THERE WAS NOT AN APPT AVAILABLE TODAY. Referrals REFERRED TO PCP OFFICE Disagree/Comply: Comply

## 2015-09-09 NOTE — Telephone Encounter (Signed)
Pt calling back checking status on call a nurse msg below. Was given an antibiotic last week by her dentist. ? Allergic reaction she is now having red rash on face & chest. Face is swollen. MD has already left for today Pls advise...Johny Chess

## 2015-09-10 NOTE — Telephone Encounter (Signed)
Notified pt Dr. Ronnald Ramp sent in medrol pack. She stated that she pick it up yesterday...Gina Jefferson

## 2015-12-10 ENCOUNTER — Ambulatory Visit (INDEPENDENT_AMBULATORY_CARE_PROVIDER_SITE_OTHER): Payer: 59 | Admitting: Internal Medicine

## 2015-12-10 ENCOUNTER — Encounter: Payer: Self-pay | Admitting: Internal Medicine

## 2015-12-10 VITALS — BP 112/80 | HR 87 | Temp 98.1°F | Resp 18 | Ht 62.0 in | Wt 158.0 lb

## 2015-12-10 DIAGNOSIS — J309 Allergic rhinitis, unspecified: Secondary | ICD-10-CM | POA: Diagnosis not present

## 2015-12-10 DIAGNOSIS — R42 Dizziness and giddiness: Secondary | ICD-10-CM | POA: Diagnosis not present

## 2015-12-10 MED ORDER — FLUTICASONE PROPIONATE 50 MCG/ACT NA SUSP: 2.0000 | Freq: Every day | NASAL | Status: DC

## 2015-12-10 MED ORDER — BENZONATATE 100 MG PO CAPS: 100.0000 mg | ORAL_CAPSULE | Freq: Three times a day (TID) | ORAL | Status: DC | PRN

## 2015-12-10 MED ORDER — MECLIZINE HCL 12.5 MG PO TABS: 12.5000 mg | ORAL_TABLET | Freq: Three times a day (TID) | ORAL | Status: DC | PRN

## 2015-12-10 MED FILL — FLUTICASONE PROP 50 MCG SPR: 50 | 30 days supply | Qty: 16 | Fill #0

## 2015-12-10 MED FILL — BENZONATATE 100 MG CAPSULE: 100 | 20 days supply | Qty: 60 | Fill #0

## 2015-12-10 NOTE — Patient Instructions (Signed)
We have sent in a nose spray called flonase for the cough and sinuses. Use 2 sprays in each nostril once a day for 1-2 weeks to help clear the sinuses. If you need it all the time it is safe to use.   We have also sent in the tessalon perles that you can use during the day for cough.   We have sent in meclizine which is the medicine to get rid of the dizziness. You can use that as needed for dizzy spells.   Work on the exercises twice a day for the next 2 weeks then if the dizzy spells are gone I would still do those exercises 3 times per week at least to keep it from coming back.  Benign Positional Vertigo Vertigo is the feeling that you or your surroundings are moving when they are not. Benign positional vertigo is the most common form of vertigo. The cause of this condition is not serious (is benign). This condition is triggered by certain movements and positions (is positional). This condition can be dangerous if it occurs while you are doing something that could endanger you or others, such as driving.  CAUSES In many cases, the cause of this condition is not known. It may be caused by a disturbance in an area of the inner ear that helps your brain to sense movement and balance. This disturbance can be caused by a viral infection (labyrinthitis), head injury, or repetitive motion. RISK FACTORS This condition is more likely to develop in:  Women.  People who are 37 years of age or older. SYMPTOMS Symptoms of this condition usually happen when you move your head or your eyes in different directions. Symptoms may start suddenly, and they usually last for less than a minute. Symptoms may include:  Loss of balance and falling.  Feeling like you are spinning or moving.  Feeling like your surroundings are spinning or moving.  Nausea and vomiting.  Blurred vision.  Dizziness.  Involuntary eye movement (nystagmus). Symptoms can be mild and cause only slight annoyance, or they can be  severe and interfere with daily life. Episodes of benign positional vertigo may return (recur) over time, and they may be triggered by certain movements. Symptoms may improve over time. DIAGNOSIS This condition is usually diagnosed by medical history and a physical exam of the head, neck, and ears. You may be referred to a health care provider who specializes in ear, nose, and throat (ENT) problems (otolaryngologist) or a provider who specializes in disorders of the nervous system (neurologist). You may have additional testing, including:  MRI.  A CT scan.  Eye movement tests. Your health care provider may ask you to change positions quickly while he or she watches you for symptoms of benign positional vertigo, such as nystagmus. Eye movement may be tested with an electronystagmogram (ENG), caloric stimulation, the Dix-Hallpike test, or the roll test.  An electroencephalogram (EEG). This records electrical activity in your brain.  Hearing tests. TREATMENT Usually, your health care provider will treat this by moving your head in specific positions to adjust your inner ear back to normal. Surgery may be needed in severe cases, but this is rare. In some cases, benign positional vertigo may resolve on its own in 2-4 weeks. HOME CARE INSTRUCTIONS Safety  Move slowly.Avoid sudden body or head movements.  Avoid driving.  Avoid operating heavy machinery.  Avoid doing any tasks that would be dangerous to you or others if a vertigo episode would occur.  If  you have trouble walking or keeping your balance, try using a cane for stability. If you feel dizzy or unstable, sit down right away.  Return to your normal activities as told by your health care provider. Ask your health care provider what activities are safe for you. General Instructions  Take over-the-counter and prescription medicines only as told by your health care provider.  Avoid certain positions or movements as told by your health  care provider.  Drink enough fluid to keep your urine clear or pale yellow.  Keep all follow-up visits as told by your health care provider. This is important. SEEK MEDICAL CARE IF:  You have a fever.  Your condition gets worse or you develop new symptoms.  Your family or friends notice any behavioral changes.  Your nausea or vomiting gets worse.  You have numbness or a "pins and needles" sensation. SEEK IMMEDIATE MEDICAL CARE IF:  You have difficulty speaking or moving.  You are always dizzy.  You faint.  You develop severe headaches.  You have weakness in your legs or arms.  You have changes in your hearing or vision.  You develop a stiff neck.  You develop sensitivity to light.   This information is not intended to replace advice given to you by your health care provider. Make sure you discuss any questions you have with your health care provider.   Document Released: 08/22/2006 Document Revised: 08/05/2015 Document Reviewed: 03/09/2015 Elsevier Interactive Patient Education [employer].

## 2015-12-10 NOTE — Progress Notes (Signed)
Pre visit review using our clinic review tool, if applicable. No additional management support is needed unless otherwise documented below in the visit note. 

## 2015-12-11 DIAGNOSIS — R42 Dizziness and giddiness: Secondary | ICD-10-CM | POA: Insufficient documentation

## 2015-12-11 NOTE — Assessment & Plan Note (Signed)
Could be BPPV in the morning versus some orthostatic hypotension however before BP meds and no change in BP with standing today. Advised to hydrate well and given epley maneuver to practice to see if this helps. If no improvement rx for meclizine in case of worsening. Can consider adjusting BP meds if no relief. No indication for imaging today.

## 2015-12-11 NOTE — Assessment & Plan Note (Signed)
Rx for flonase given at visit. No indication for antibiotics and does not need steroids. Rx for tessalon perles for the cough. Expectant course explained to her today.

## 2015-12-11 NOTE — Progress Notes (Signed)
   Subjective:    Patient ID: Gina Jefferson, female    DOB: October 21, 1951, 65 y.o.   MRN: [id number]  HPI The patient is a 65 YO female coming in with several concerns. First is nose congestion and cough for 4 days. No fevers or chills, mild non-productive cough. No SOB or chest pains. Overall stable, has tried mucinex otc without relief.  Next concern is some morning dizziness with standing in the morning. Gone within 20 minutes. Has not fallen as she is very careful. More lightheadedness than dizziness. Before she takes her medicine in the morning. Does not occur later in the day. No syncope. Accompanied by mild nausea.   Review of Systems  Constitutional: Negative for fever, chills, activity change, appetite change, fatigue and unexpected weight change.  HENT: Positive for congestion, postnasal drip and rhinorrhea. Negative for sinus pressure and sore throat.   Eyes: Negative.   Respiratory: Positive for cough. Negative for chest tightness, shortness of breath and wheezing.   Cardiovascular: Negative.   Gastrointestinal: Negative.   Musculoskeletal: Negative.   Neurological: Positive for light-headedness. Negative for dizziness, syncope, weakness, numbness and headaches.  Psychiatric/Behavioral: Negative.       Objective:   Physical Exam  Constitutional: She is oriented to person, place, and time. She appears well-developed and well-nourished.  HENT:  Head: Normocephalic and atraumatic.  Mild oropharynx erythema, no nasal crusting or sinus tenderness  Eyes: EOM are normal.  Neck: Normal range of motion.  Cardiovascular: Normal rate and regular rhythm.   Pulmonary/Chest: Effort normal and breath sounds normal. No respiratory distress.  Abdominal: Soft. Bowel sounds are normal.  Musculoskeletal: She exhibits no edema.  No dizziness with standing.   Neurological: She is alert and oriented to person, place, and time. Coordination normal.  Skin: Skin is warm and dry.  Psychiatric: She  has a normal mood and affect.    Filed Vitals:   12/10/15 1118  BP: 112/80  Pulse: 87  Temp: 98.1 F (36.7 C)  TempSrc: Oral  Resp: 18  Height: 5\' 2"  (1.575 m)  Weight: 158 lb (71.668 kg)  SpO2: 96%      Assessment & Plan:

## 2015-12-22 MED FILL — DICLOFENAC SOD EC 75 MG TAB: 75 | 30 days supply | Qty: 60 | Fill #0

## 2016-01-26 MED FILL — AMLODIPINE-VALSARTAN 5-160: 5-160 | 90 days supply | Qty: 90 | Fill #3

## 2016-02-15 DIAGNOSIS — L03213 Periorbital cellulitis: Secondary | ICD-10-CM | POA: Diagnosis not present

## 2016-02-15 DIAGNOSIS — H40013 Open angle with borderline findings, low risk, bilateral: Secondary | ICD-10-CM | POA: Diagnosis not present

## 2016-02-15 MED FILL — NEO/POLY/DEXAMET EYE OINT: 3.5-10000-0 | 7 days supply | Qty: 4 | Fill #0

## 2016-02-15 MED FILL — CEPHALEXIN 500 MG CAPSULE: 500 | 10 days supply | Qty: 30 | Fill #0

## 2016-02-18 ENCOUNTER — Other Ambulatory Visit (INDEPENDENT_AMBULATORY_CARE_PROVIDER_SITE_OTHER): Payer: 59

## 2016-02-18 ENCOUNTER — Encounter: Payer: Self-pay | Admitting: Internal Medicine

## 2016-02-18 ENCOUNTER — Ambulatory Visit (INDEPENDENT_AMBULATORY_CARE_PROVIDER_SITE_OTHER): Payer: 59 | Admitting: Internal Medicine

## 2016-02-18 VITALS — BP 104/70 | HR 51 | Temp 99.0°F | Resp 12 | Ht 61.0 in | Wt 157.1 lb

## 2016-02-18 DIAGNOSIS — R7301 Impaired fasting glucose: Secondary | ICD-10-CM | POA: Diagnosis not present

## 2016-02-18 DIAGNOSIS — E785 Hyperlipidemia, unspecified: Secondary | ICD-10-CM | POA: Diagnosis not present

## 2016-02-18 DIAGNOSIS — I10 Essential (primary) hypertension: Secondary | ICD-10-CM

## 2016-02-18 DIAGNOSIS — Z0000 Encounter for general adult medical examination without abnormal findings: Secondary | ICD-10-CM

## 2016-02-18 LAB — COMPREHENSIVE METABOLIC PANEL
ALT: 22 U/L (ref 0–35)
AST: 25 U/L (ref 0–37)
Albumin: 3.9 g/dL (ref 3.5–5.2)
Alkaline Phosphatase: 81 U/L (ref 39–117)
BUN: 13 mg/dL (ref 6–23)
CALCIUM: 9.2 mg/dL (ref 8.4–10.5)
CHLORIDE: 100 meq/L (ref 96–112)
CO2: 30 meq/L (ref 19–32)
Creatinine, Ser: 0.86 mg/dL (ref 0.40–1.20)
GFR: 85.4 mL/min (ref >60.00)
GLUCOSE: 105 mg/dL — AB (ref 70–99)
Potassium: 3.5 mEq/L (ref 3.5–5.1)
Sodium: 137 mEq/L (ref 135–145)
Total Bilirubin: 0.6 mg/dL (ref 0.2–1.2)
Total Protein: 7.6 g/dL (ref 6.0–8.3)

## 2016-02-18 LAB — LIPID PANEL
CHOLESTEROL: 211 mg/dL — AB (ref 0–200)
HDL: 44.8 mg/dL (ref >39.00)
LDL CALC: 147 mg/dL — AB (ref 0–99)
NonHDL: 166.61
TRIGLYCERIDES: 99 mg/dL (ref 0.0–149.0)
Total CHOL/HDL Ratio: 5
VLDL: 19.8 mg/dL (ref 0.0–40.0)

## 2016-02-18 LAB — TSH: TSH: 1.51 u[IU]/mL (ref 0.35–4.50)

## 2016-02-18 LAB — HEMOGLOBIN A1C: Hgb A1c MFr Bld: 6.2 % (ref 4.6–6.5)

## 2016-02-18 MED ORDER — FLUCONAZOLE 150 MG PO TABS: 150.0000 mg | ORAL_TABLET | Freq: Once | ORAL | Status: DC

## 2016-02-18 MED FILL — FLUCONAZOLE 150 MG TABLET: 150 | 1 days supply | Qty: 1 | Fill #0

## 2016-02-18 NOTE — Assessment & Plan Note (Signed)
Checking labs, adjust as needed. Immunizations up to date. Colonoscopy up to date. Does not need pap smear. Mammogram up to date. Given screening recommendations.

## 2016-02-18 NOTE — Assessment & Plan Note (Signed)
On chart review she does not seem to have diabetes. Checking Hga1c for some impaired fasting sugars. Have removed diabetes from her problem list. HgA1c never higher than 6.1 in records. Is on ARB.

## 2016-02-18 NOTE — Progress Notes (Signed)
   Subjective:    Patient ID: Gina Jefferson, female    DOB: [birth year], 65 y.o.   MRN: [id number]  HPI The patient is a 65 YO female coming in for wellness. Taking an antibiotic for an eye infection right now. Giving her some nausea. No new concerns.   PMH, [facility], social history reviewed and updated.   Review of Systems  Constitutional: Negative for fever, activity change, appetite change, fatigue and unexpected weight change.  HENT: Negative.   Eyes: Negative.   Respiratory: Negative for cough, chest tightness, shortness of breath and wheezing.   Cardiovascular: Negative for chest pain, palpitations and leg swelling.  Gastrointestinal: Positive for nausea. Negative for abdominal pain, diarrhea, constipation and abdominal distention.       Mild with antibiotic  Musculoskeletal: Positive for arthralgias. Negative for myalgias, back pain and gait problem.       Rare  Skin: Negative.   Neurological: Negative.   Psychiatric/Behavioral: Negative.       Objective:   Physical Exam  Constitutional: She is oriented to person, place, and time. She appears well-developed and well-nourished.  HENT:  Head: Normocephalic and atraumatic.  Nose: Nose normal.  Mouth/Throat: Oropharynx is clear and moist.  Eyes: EOM are normal.  Neck: Normal range of motion.  Cardiovascular: Normal rate and regular rhythm.   Pulmonary/Chest: Effort normal and breath sounds normal. No respiratory distress. She has no wheezes. She has no rales.  Abdominal: Soft. Bowel sounds are normal. She exhibits no distension. There is no tenderness. There is no rebound.  Musculoskeletal: She exhibits no edema.  Neurological: She is alert and oriented to person, place, and time. Coordination normal.  Skin: Skin is warm and dry.  Psychiatric: She has a normal mood and affect.   Filed Vitals:   02/18/16 0835  BP: 104/70  Pulse: 51  Temp: 99 F (37.2 C)  TempSrc: Oral  Resp: 12  Height: 5\' 1"  (1.549 m)  Weight: 157 lb  1.9 oz (71.269 kg)  SpO2: 93%      Assessment & Plan:

## 2016-02-18 NOTE — Patient Instructions (Signed)
We will check the labs today and call you back with the results.   The antibiotic may be causing the cough and you can take a zantac or omeprazole daily to help out with it if it bothers you. This should go away on its own.   We have sent in the diflucan for the thrush in case you get that.   Health Maintenance, Female Adopting a healthy lifestyle and getting preventive care can go a long way to promote health and wellness. Talk with your health care provider about what schedule of regular examinations is right for you. This is a good chance for you to check in with your provider about disease prevention and staying healthy. In between checkups, there are plenty of things you can do on your own. Experts have done a lot of research about which lifestyle changes and preventive measures are most likely to keep you healthy. Ask your health care provider for more information. WEIGHT AND DIET  Eat a healthy diet  Be sure to include plenty of vegetables, fruits, low-fat dairy products, and lean protein.  Do not eat a lot of foods high in solid fats, added sugars, or salt.  Get regular exercise. This is one of the most important things you can do for your health.  Most adults should exercise for at least 150 minutes each week. The exercise should increase your heart rate and make you sweat (moderate-intensity exercise).  Most adults should also do strengthening exercises at least twice a week. This is in addition to the moderate-intensity exercise.  Maintain a healthy weight  Body mass index (BMI) is a measurement that can be used to identify possible weight problems. It estimates body fat based on height and weight. Your health care provider can help determine your BMI and help you achieve or maintain a healthy weight.  For females 63 years of age and older:   A BMI below 18.5 is considered underweight.  A BMI of 18.5 to 24.9 is normal.  A BMI of 25 to 29.9 is considered overweight.  A  BMI of 30 and above is considered obese.  Watch levels of cholesterol and blood lipids  You should start having your blood tested for lipids and cholesterol at 64 years of age, then have this test every 5 years.  You may need to have your cholesterol levels checked more often if:  Your lipid or cholesterol levels are high.  You are older than 64 years of age.  You are at high risk for heart disease.  CANCER SCREENING   Lung Cancer  Lung cancer screening is recommended for adults 71-55 years old who are at high risk for lung cancer because of a history of smoking.  A yearly low-dose CT scan of the lungs is recommended for people who:  Currently smoke.  Have quit within the past 15 years.  Have at least a 30-pack-year history of smoking. A pack year is smoking an average of one pack of cigarettes a day for 1 year.  Yearly screening should continue until it has been 15 years since you quit.  Yearly screening should stop if you develop a health problem that would prevent you from having lung cancer treatment.  Breast Cancer  Practice breast self-awareness. This means understanding how your breasts normally appear and feel.  It also means doing regular breast self-exams. Let your health care provider know about any changes, no matter how small.  If you are in your 20s or 30s, you  should have a clinical breast exam (CBE) by a health care provider every 1-3 years as part of a regular health exam.  If you are 23 or older, have a CBE every year. Also consider having a breast X-ray (mammogram) every year.  If you have a family history of breast cancer, talk to your health care provider about genetic screening.  If you are at high risk for breast cancer, talk to your health care provider about having an MRI and a mammogram every year.  Breast cancer gene (BRCA) assessment is recommended for women who have family members with BRCA-related cancers. BRCA-related cancers  include:  Breast.  Ovarian.  Tubal.  Peritoneal cancers.  Results of the assessment will determine the need for genetic counseling and BRCA1 and BRCA2 testing. Cervical Cancer Your health care provider may recommend that you be screened regularly for cancer of the pelvic organs (ovaries, uterus, and vagina). This screening involves a pelvic examination, including checking for microscopic changes to the surface of your cervix (Pap test). You may be encouraged to have this screening done every 3 years, beginning at age 36.  For women ages 61-65, health care providers may recommend pelvic exams and Pap testing every 3 years, or they may recommend the Pap and pelvic exam, combined with testing for human papilloma virus (HPV), every 5 years. Some types of HPV increase your risk of cervical cancer. Testing for HPV may also be done on women of any age with unclear Pap test results.  Other health care providers may not recommend any screening for nonpregnant women who are considered low risk for pelvic cancer and who do not have symptoms. Ask your health care provider if a screening pelvic exam is right for you.  If you have had past treatment for cervical cancer or a condition that could lead to cancer, you need Pap tests and screening for cancer for at least 20 years after your treatment. If Pap tests have been discontinued, your risk factors (such as having a new sexual partner) need to be reassessed to determine if screening should resume. Some women have medical problems that increase the chance of getting cervical cancer. In these cases, your health care provider may recommend more frequent screening and Pap tests. Colorectal Cancer  This type of cancer can be detected and often prevented.  Routine colorectal cancer screening usually begins at 64 years of age and continues through 64 years of age.  Your health care provider may recommend screening at an earlier age if you have risk factors for  colon cancer.  Your health care provider may also recommend using home test kits to check for hidden blood in the stool.  A small camera at the end of a tube can be used to examine your colon directly (sigmoidoscopy or colonoscopy). This is done to check for the earliest forms of colorectal cancer.  Routine screening usually begins at age 33.  Direct examination of the colon should be repeated every 5-10 years through 64 years of age. However, you may need to be screened more often if early forms of precancerous polyps or small growths are found. Skin Cancer  Check your skin from head to toe regularly.  Tell your health care provider about any new moles or changes in moles, especially if there is a change in a mole's shape or color.  Also tell your health care provider if you have a mole that is larger than the size of a pencil eraser.  Always use sunscreen.  Apply sunscreen liberally and repeatedly throughout the day.  Protect yourself by wearing long sleeves, pants, a wide-brimmed hat, and sunglasses whenever you are outside. HEART DISEASE, DIABETES, AND HIGH BLOOD PRESSURE   High blood pressure causes heart disease and increases the risk of stroke. High blood pressure is more likely to develop in:  People who have blood pressure in the high end of the normal range (130-139/85-89 mm Hg).  People who are overweight or obese.  People who are African American.  If you are 59-61 years of age, have your blood pressure checked every 3-5 years. If you are 27 years of age or older, have your blood pressure checked every year. You should have your blood pressure measured twice--once when you are at a hospital or clinic, and once when you are not at a hospital or clinic. Record the average of the two measurements. To check your blood pressure when you are not at a hospital or clinic, you can use:  An automated blood pressure machine at a pharmacy.  A home blood pressure monitor.  If you  are between 22 years and 26 years old, ask your health care provider if you should take aspirin to prevent strokes.  Have regular diabetes screenings. This involves taking a blood sample to check your fasting blood sugar level.  If you are at a normal weight and have a low risk for diabetes, have this test once every three years after 64 years of age.  If you are overweight and have a high risk for diabetes, consider being tested at a younger age or more often. PREVENTING INFECTION  Hepatitis B  If you have a higher risk for hepatitis B, you should be screened for this virus. You are considered at high risk for hepatitis B if:  You were born in a country where hepatitis B is common. Ask your health care provider which countries are considered high risk.  Your parents were born in a high-risk country, and you have not been immunized against hepatitis B (hepatitis B vaccine).  You have HIV or AIDS.  You use needles to inject street drugs.  You live with someone who has hepatitis B.  You have had sex with someone who has hepatitis B.  You get hemodialysis treatment.  You take certain medicines for conditions, including cancer, organ transplantation, and autoimmune conditions. Hepatitis C  Blood testing is recommended for:  Everyone born from [birth year] through 1964.  Anyone with known risk factors for hepatitis C. Sexually transmitted infections (STIs)  You should be screened for sexually transmitted infections (STIs) including gonorrhea and chlamydia if:  You are sexually active and are younger than 64 years of age.  You are older than 64 years of age and your health care provider tells you that you are at risk for this type of infection.  Your sexual activity has changed since you were last screened and you are at an increased risk for chlamydia or gonorrhea. Ask your health care provider if you are at risk.  If you do not have HIV, but are at risk, it may be recommended that you  take a prescription medicine daily to prevent HIV infection. This is called pre-exposure prophylaxis (PrEP). You are considered at risk if:  You are sexually active and do not regularly use condoms or know the HIV status of your partner(s).  You take drugs by injection.  You are sexually active with a partner who has HIV. Talk with your health care provider about  whether you are at high risk of being infected with HIV. If you choose to begin PrEP, you should first be tested for HIV. You should then be tested every 3 months for as long as you are taking PrEP.  PREGNANCY   If you are premenopausal and you may become pregnant, ask your health care provider about preconception counseling.  If you may become pregnant, take 400 to 800 micrograms (mcg) of folic acid every day.  If you want to prevent pregnancy, talk to your health care provider about birth control (contraception). OSTEOPOROSIS AND MENOPAUSE   Osteoporosis is a disease in which the bones lose minerals and strength with aging. This can result in serious bone fractures. Your risk for osteoporosis can be identified using a bone density scan.  If you are 38 years of age or older, or if you are at risk for osteoporosis and fractures, ask your health care provider if you should be screened.  Ask your health care provider whether you should take a calcium or vitamin D supplement to lower your risk for osteoporosis.  Menopause may have certain physical symptoms and risks.  Hormone replacement therapy may reduce some of these symptoms and risks. Talk to your health care provider about whether hormone replacement therapy is right for you.  HOME CARE INSTRUCTIONS   Schedule regular health, dental, and eye exams.  Stay current with your immunizations.   Do not use any tobacco products including cigarettes, chewing tobacco, or electronic cigarettes.  If you are pregnant, do not drink alcohol.  If you are breastfeeding, limit how  much and how often you drink alcohol.  Limit alcohol intake to no more than 1 drink per day for nonpregnant women. One drink equals 12 ounces of beer, 5 ounces of wine, or 1 ounces of hard liquor.  Do not use street drugs.  Do not share needles.  Ask your health care provider for help if you need support or information about quitting drugs.  Tell your health care provider if you often feel depressed.  Tell your health care provider if you have ever been abused or do not feel safe at home.   This information is not intended to replace advice given to you by your health care provider. Make sure you discuss any questions you have with your health care provider.   Document Released: 05/30/2011 Document Revised: 12/05/2014 Document Reviewed: 10/16/2013 Elsevier Interactive Patient Education [employer].

## 2016-02-18 NOTE — Assessment & Plan Note (Signed)
Checking lipid panel today, off meds and last at goal.

## 2016-02-18 NOTE — Progress Notes (Signed)
Pre visit review using our clinic review tool, if applicable. No additional management support is needed unless otherwise documented below in the visit note. 

## 2016-02-18 NOTE — Assessment & Plan Note (Signed)
BP at goal on amlodipine and valsartan. Checking CMP and adjust as needed.

## 2016-02-19 LAB — HEPATITIS C ANTIBODY: HCV AB: REACTIVE — AB

## 2016-02-22 LAB — HEPATITIS C RNA QUANTITATIVE: HCV Quantitative: NOT DETECTED [IU]/mL

## 2016-03-01 DIAGNOSIS — M654 Radial styloid tenosynovitis [de Quervain]: Secondary | ICD-10-CM | POA: Diagnosis not present

## 2016-03-03 MED FILL — DICLOFENAC SOD EC 75 MG TAB: 75 | 30 days supply | Qty: 60 | Fill #0

## 2016-05-05 ENCOUNTER — Ambulatory Visit (INDEPENDENT_AMBULATORY_CARE_PROVIDER_SITE_OTHER): Payer: 59 | Admitting: Family

## 2016-05-05 VITALS — BP 130/100 | HR 80 | Temp 98.0°F | Ht 61.0 in | Wt 157.4 lb

## 2016-05-05 DIAGNOSIS — R591 Generalized enlarged lymph nodes: Secondary | ICD-10-CM | POA: Diagnosis not present

## 2016-05-05 DIAGNOSIS — R599 Enlarged lymph nodes, unspecified: Secondary | ICD-10-CM

## 2016-05-05 NOTE — Progress Notes (Signed)
Pre visit review using our clinic review tool, if applicable. No additional management support is needed unless otherwise documented below in the visit note. 

## 2016-05-05 NOTE — Progress Notes (Signed)
Subjective:    Patient ID: Gina Jefferson, female    DOB: September 24, 1951, 65 y.o.   MRN: [id number]   Gina Jefferson is a 65 y.o. female who presents today for an acute visit.    HPI Comments: Patient noticed it a few days ago. Patient states that is painful to some degree and she can feel it when she moves her neck. Lump is located on the left mid posterior portion of her neck. Tender. No URI symptoms, fever, chils. Some seasonal allegries 'but not bad'.  No heart palpitations, unintentational weight loss, bone pain, intolerance to heat/cold. No history of cancer. H/o thyromegaly. She follows with her PCP. TSH was normal this year.  Past Medical History  Diagnosis Date  . Anxiety   . HYPERTENSION     under control; has been on med. x 15 yrs.  . Seasonal allergies   . Arthritis     knee, c-spine  . GERD   . Carpal tunnel syndrome of right wrist 10/2011    s/p surgical release  . Hyperlipidemia   . Colon polyp   . Hx of adenomatous polyp of colon 2010   Allergies: Clindamycin/lincomycin; Ciprofloxacin; Penicillins; Statins; and Metformin Current Outpatient Prescriptions on File Prior to Visit  Medication Sig Dispense Refill  . amLODipine-valsartan (EXFORGE) 5-160 MG per tablet Take 1 tablet by mouth daily. 90 tablet 3  . Ascorbic Acid (VITAMIN C) 500 MG CAPS Take 500 mg by mouth daily.    . beta carotene w/minerals (OCUVITE) tablet Take 1 tablet by mouth daily. Reported on 02/18/2016    . Biotin 1000 MCG tablet Take 1,000 mcg by mouth daily.      . cetirizine (ZYRTEC) 10 MG tablet Take 10 mg by mouth daily.      . diclofenac (VOLTAREN) 75 MG EC tablet Take 75 mg by mouth 1 day or 1 dose.    . dicyclomine (BENTYL) 10 MG capsule Take 1 tab 3 times daily as needed for cramping and spasms. 90 capsule 3  . fluticasone (FLONASE) 50 MCG/ACT nasal spray Place 2 sprays into both nostrils daily. 16 g 6  . Garlic [id number] MG TBEC Take by mouth daily.    Marland Kitchen glucose blood (FREESTYLE TEST STRIPS)  test strip Use as instructed 30 each 5  . sertraline (ZOLOFT) 25 MG tablet Take 1 tablet (25 mg total) by mouth daily. 90 tablet 2  . Zinc 30 MG TABS Take by mouth 3 (three) times daily.    . benzonatate (TESSALON) 100 MG capsule Take 1 capsule (100 mg total) by mouth 3 (three) times daily as needed for cough. (Patient not taking: Reported on 05/05/2016) 60 capsule 0  . meclizine (ANTIVERT) 12.5 MG tablet Take 1 tablet (12.5 mg total) by mouth 3 (three) times daily as needed for dizziness. (Patient not taking: Reported on 05/05/2016) 30 tablet 0   No current facility-administered medications on file prior to visit.    Social History  Substance Use Topics  . Smoking status: Former Smoker    Types: Cigarettes    Quit date: 11/29/2003  . Smokeless tobacco: Never Used  . Alcohol Use: No     Comment: occasionally    Review of Systems  Constitutional: Negative for fever and chills.  Respiratory: Negative for cough.   Cardiovascular: Negative for chest pain and palpitations.  Gastrointestinal: Negative for nausea and vomiting.      Objective:    BP 130/100 mmHg  Pulse 80  Temp(Src) 98 F (36.7  C) (Oral)  Ht 5\' 1"  (1.549 m)  Wt 157 lb 7 oz (71.413 kg)  BMI 29.76 kg/m2  SpO2 98%   Physical Exam  Constitutional: She appears well-developed and well-nourished.  Eyes: Conjunctivae are normal.  Neck: No thyroid mass and no thyromegaly present.  Cardiovascular: Normal rate, regular rhythm, normal heart sounds and normal pulses.   Pulmonary/Chest: Effort normal and breath sounds normal. She has no wheezes. She has no rhonchi. She has no rales.  Lymphadenopathy:       Head (right side): No submental, no submandibular, no tonsillar, no preauricular, no posterior auricular and no occipital adenopathy present.       Head (left side): No submental, no submandibular, no tonsillar, no preauricular, no posterior auricular and no occipital adenopathy present.    She has cervical adenopathy.        Left cervical: Posterior cervical adenopathy present.    She has no axillary adenopathy.       Right: No supraclavicular adenopathy present.       Left: No supraclavicular adenopathy present.  Approx 2cm enlarged nodule, tender, movable.   Neurological: She is alert.  Skin: Skin is warm and dry.  Psychiatric: She has a normal mood and affect. Her speech is normal and behavior is normal. Thought content normal.  Vitals reviewed.      Assessment & Plan:   1. Enlarged lymph node Working diagnosis of reactive lymph node. Suspect seasonal allergies may be playing a role as patient has no obvious URI symptoms. Patient and I discussed reactive lymph nodes versus malignant lymph nodes including worrisome features of which she needs to remain vigilant for. Both patient and I were comfortable with watching this lymph node for another couple weeks and if it persists we will do an ultrasound.    I have discontinued Gina Jefferson's multivitamin, Multiple Minerals-Vitamins (CALCIUM-MAGNESIUM-ZINC-D3 PO), lidocaine, cephALEXin, and fluconazole. I am also having her maintain her Biotin, cetirizine, diclofenac, Garlic, Vitamin C, glucose blood, dicyclomine, sertraline, Zinc, beta carotene w/minerals, amLODipine-valsartan, benzonatate, fluticasone, meclizine, and Diclofenac Sodium.   Meds ordered this encounter  Medications  . Diclofenac Sodium 3 % GEL    Sig: Reported on 05/05/2016    Refill:  98     Start medications as prescribed and explained to patient on After Visit Summary ( AVS). Risks, benefits, and alternatives of the medications and treatment plan prescribed today were discussed, and patient expressed understanding.   Education regarding symptom management and diagnosis given to patient.   Follow-up:Plan follow-up and return precautions given if any worsening symptoms or change in condition.   Continue to follow with Hoyt Koch, MD for routine health maintenance.   Gina S  Jefferson and I agreed with plan.   Mable Paris, FNP

## 2016-05-05 NOTE — Patient Instructions (Signed)
Continue to monitor your enlarged lymph node.   As we discussed, it is very likely that this enlarged lymph node is a reactive lymph node and result of recent viral or bacterial illness. It should be self limiting and resolve on its own. If it does not resolve in a couple of weeks, please return for re-evaluation.   Also return if node increases size, redness, warmth, becomes tender, or if it becomes immovable and/or hard. Also, return to the clinic for any increased bruising or easy bleeding, weight loss, or night sweats.

## 2016-05-13 MED FILL — DICLOFENAC SOD EC 75 MG TAB: 75 | 30 days supply | Qty: 60 | Fill #1

## 2016-05-16 ENCOUNTER — Other Ambulatory Visit: Payer: Self-pay | Admitting: *Deleted

## 2016-05-16 MED ORDER — AMLODIPINE BESYLATE-VALSARTAN 5-160 MG PO TABS: 1.0000 | ORAL_TABLET | Freq: Every day | ORAL | Status: DC

## 2016-05-16 MED FILL — AMLODIPINE-VALSARTAN 5-160: 5-160 | 90 days supply | Qty: 90 | Fill #0

## 2016-06-08 DIAGNOSIS — H40013 Open angle with borderline findings, low risk, bilateral: Secondary | ICD-10-CM | POA: Diagnosis not present

## 2016-06-08 DIAGNOSIS — H2511 Age-related nuclear cataract, right eye: Secondary | ICD-10-CM | POA: Diagnosis not present

## 2016-06-08 DIAGNOSIS — H1852 Epithelial (juvenile) corneal dystrophy: Secondary | ICD-10-CM | POA: Diagnosis not present

## 2016-06-08 DIAGNOSIS — H1013 Acute atopic conjunctivitis, bilateral: Secondary | ICD-10-CM | POA: Diagnosis not present

## 2016-06-08 LAB — HM DIABETES EYE EXAM

## 2016-06-09 DIAGNOSIS — M65311 Trigger thumb, right thumb: Secondary | ICD-10-CM | POA: Diagnosis not present

## 2016-06-09 DIAGNOSIS — M19031 Primary osteoarthritis, right wrist: Secondary | ICD-10-CM | POA: Diagnosis not present

## 2016-06-17 ENCOUNTER — Encounter: Payer: Self-pay | Admitting: Geriatric Medicine

## 2016-08-08 MED FILL — DICLOFENAC SOD EC 75 MG TAB: 75 | 30 days supply | Qty: 60 | Fill #2

## 2016-08-10 DIAGNOSIS — Z01419 Encounter for gynecological examination (general) (routine) without abnormal findings: Secondary | ICD-10-CM | POA: Diagnosis not present

## 2016-08-10 DIAGNOSIS — Z1231 Encounter for screening mammogram for malignant neoplasm of breast: Secondary | ICD-10-CM | POA: Diagnosis not present

## 2016-08-10 DIAGNOSIS — Z6829 Body mass index (BMI) 29.0-29.9, adult: Secondary | ICD-10-CM | POA: Diagnosis not present

## 2016-08-17 ENCOUNTER — Other Ambulatory Visit: Payer: Self-pay | Admitting: Obstetrics and Gynecology

## 2016-08-17 DIAGNOSIS — R928 Other abnormal and inconclusive findings on diagnostic imaging of breast: Secondary | ICD-10-CM

## 2016-08-24 ENCOUNTER — Ambulatory Visit
Admission: RE | Admit: 2016-08-24 | Discharge: 2016-08-24 | Disposition: A | Discharge status: Home / Self Care | Payer: 59 | Source: Ambulatory Visit | Attending: Obstetrics and Gynecology | Admitting: Obstetrics and Gynecology

## 2016-08-24 DIAGNOSIS — R928 Other abnormal and inconclusive findings on diagnostic imaging of breast: Secondary | ICD-10-CM

## 2016-08-28 ENCOUNTER — Encounter: Payer: Self-pay | Admitting: Student

## 2016-09-09 MED FILL — AMLODIPINE-VALSARTAN 5-160: 5-160 | 90 days supply | Qty: 90 | Fill #1

## 2016-10-13 ENCOUNTER — Encounter: Payer: Self-pay | Admitting: Podiatry

## 2016-10-13 ENCOUNTER — Ambulatory Visit (INDEPENDENT_AMBULATORY_CARE_PROVIDER_SITE_OTHER): Payer: 59 | Admitting: Podiatry

## 2016-10-13 VITALS — BP 144/96 | HR 81

## 2016-10-13 DIAGNOSIS — M7989 Other specified soft tissue disorders: Secondary | ICD-10-CM

## 2016-10-13 DIAGNOSIS — M79675 Pain in left toe(s): Secondary | ICD-10-CM

## 2016-10-13 DIAGNOSIS — M2042 Other hammer toe(s) (acquired), left foot: Secondary | ICD-10-CM | POA: Diagnosis not present

## 2016-10-13 NOTE — Progress Notes (Signed)
Subjective: 65 year old female presents complaining of pain on 3rd toe left with shooting pain from under the ball of the foot duration of 2 months.   Objective: Severely contracted 3rd digit at DIPJ and PIPJ left foot. Normal neurovascular status bilateral.  Tight Achilles tendon R>L.  Normal blood flow in lower limbs.   Assessment: Severely contracted 3rd digit left with pain upon ambulation.  Contracted Achilles tendon right.  Contracted EHL left with cocked up hallux. Diabetic under control.  Plan:  Reviewed findings and available treatment options.  Patient will return for Flexor tenotomy 3rd digit left. Return if painful or as needed.

## 2016-10-13 NOTE — Patient Instructions (Signed)
Seen for pain on 3rd toe left. Noted of severe digital contracture. May benefit from Flexor tenotomy. Return for office procedure (30 minute).

## 2016-10-24 MED FILL — DICLOFENAC SOD 75 MG TAB EC: 75 | 30 days supply | Qty: 60 | Fill #3

## 2016-10-27 ENCOUNTER — Encounter: Payer: Self-pay | Admitting: Podiatry

## 2016-10-27 ENCOUNTER — Ambulatory Visit (INDEPENDENT_AMBULATORY_CARE_PROVIDER_SITE_OTHER): Payer: 59 | Admitting: Podiatry

## 2016-10-27 VITALS — BP 152/98 | HR 72

## 2016-10-27 DIAGNOSIS — M6240 Contracture of muscle, unspecified site: Secondary | ICD-10-CM | POA: Diagnosis not present

## 2016-10-27 DIAGNOSIS — M79672 Pain in left foot: Secondary | ICD-10-CM

## 2016-10-27 DIAGNOSIS — M2042 Other hammer toe(s) (acquired), left foot: Secondary | ICD-10-CM | POA: Diagnosis not present

## 2016-10-27 NOTE — Progress Notes (Signed)
Subjective: 65 year old female presents for scheduled office procedure, flexor tenotomy 3rd toe left. The toe is severely contracted and hurts to walk.   Objective: Severely contracted 3rd digit at DIPJ and PIPJ left foot. Normal neurovascular status bilateral.   Assessment: Severely contracted 3rd digit left with pain upon ambulation.  Diabetic under control.  Plan:  Reviewed surgery consent form. Procedure done: Flexor tenotomy 3rd digit left under local. Affected 3rd toe was anesthetized with total 4 ml mixture of 50/50 0.5% Marcaine plain and 1% Xylocaine plain. Left foot was prepped with Iodine solution.  Sterile technique maintained. #15 blade used to make stab incision under the sulcus of the 3rd digit at PIPJ level. Joint and flexor tendon was identified and transected. Noted of the 3rd digit was relaxed while all other digits are actively contracting. Wound was flushed and closed with 5/0 Nylon.  Home care instructions given.  Return in 5 days.

## 2016-10-27 NOTE — Patient Instructions (Signed)
Flexor tenotomy done on 3rd toe left foot. Keep the bandage dry and return next Tuesday. Take Advil for pain if needed.

## 2016-11-01 ENCOUNTER — Encounter: Payer: Self-pay | Admitting: Podiatry

## 2016-11-01 ENCOUNTER — Ambulatory Visit (INDEPENDENT_AMBULATORY_CARE_PROVIDER_SITE_OTHER): Payer: 59 | Admitting: Podiatry

## 2016-11-01 DIAGNOSIS — M2042 Other hammer toe(s) (acquired), left foot: Secondary | ICD-10-CM

## 2016-11-01 NOTE — Patient Instructions (Signed)
Post op following flexor tenotomy. Normal wound healing without complication. Sutures removed. Keep it clean, dry and covered for the next 2 days. Return as needed.

## 2016-11-01 NOTE — Progress Notes (Signed)
5 days post op following Flexor tenotomy 3rd digit left foot. Wound healing is normal.  Sutures removed. Mefix tape placed. Keep it covered and dry after each shower for the next 2 days. Return as needed.

## 2016-11-28 HISTORY — PX: CATARACT EXTRACTION, BILATERAL: SHX1313

## 2016-12-05 DIAGNOSIS — H40033 Anatomical narrow angle, bilateral: Secondary | ICD-10-CM | POA: Diagnosis not present

## 2016-12-05 DIAGNOSIS — H1013 Acute atopic conjunctivitis, bilateral: Secondary | ICD-10-CM | POA: Diagnosis not present

## 2016-12-05 DIAGNOSIS — H524 Presbyopia: Secondary | ICD-10-CM | POA: Diagnosis not present

## 2016-12-05 DIAGNOSIS — H40013 Open angle with borderline findings, low risk, bilateral: Secondary | ICD-10-CM | POA: Diagnosis not present

## 2017-01-03 MED FILL — DICLOFENAC SOD 75 MG TAB EC: 75 | 30 days supply | Qty: 60 | Fill #4

## 2017-01-03 MED FILL — AMLODIPINE-VALSARTAN 5-160: 5-160 | 90 days supply | Qty: 90 | Fill #2

## 2017-02-13 ENCOUNTER — Other Ambulatory Visit (INDEPENDENT_AMBULATORY_CARE_PROVIDER_SITE_OTHER): Payer: 59

## 2017-02-13 ENCOUNTER — Encounter: Payer: Self-pay | Admitting: Internal Medicine

## 2017-02-13 ENCOUNTER — Ambulatory Visit (INDEPENDENT_AMBULATORY_CARE_PROVIDER_SITE_OTHER): Payer: 59 | Admitting: Internal Medicine

## 2017-02-13 VITALS — BP 124/72 | HR 101 | Temp 98.5°F | Ht 61.0 in | Wt 155.0 lb

## 2017-02-13 DIAGNOSIS — R42 Dizziness and giddiness: Secondary | ICD-10-CM

## 2017-02-13 DIAGNOSIS — R21 Rash and other nonspecific skin eruption: Secondary | ICD-10-CM | POA: Diagnosis not present

## 2017-02-13 LAB — VITAMIN D 25 HYDROXY (VIT D DEFICIENCY, FRACTURES): VITD: 15.97 ng/mL — ABNORMAL LOW (ref 30.00–100.00)

## 2017-02-13 LAB — VITAMIN B12: Vitamin B-12: 331 pg/mL (ref 211–911)

## 2017-02-13 NOTE — Assessment & Plan Note (Addendum)
New onset daily severe vertigo which requires meclizine to help. She is struggling with symptoms. No trigger to explain onset and not taking daily meclizine prior to onset. Will order MRI due to severity and acuity of the condition to rule out organic brain pathology such as prior stroke or lesion. She will also be sent to neuro rehab for vestibular therapy.

## 2017-02-13 NOTE — Progress Notes (Signed)
   Subjective:    Patient ID: Gina Jefferson, female    DOB: 1951/06/28, 66 y.o.   MRN: [id number]  HPI The patient is a 66 YO female coming in for acute problem of daily vertigo. In the past years she has had vertigo which is rare and episodic and never lasts more than 1 week. For the past 2 months she has had vertigo daily. She is using meclizine for the dizziness but even low doses make her drowsy and she is not able to take it when driving or at work. She gets the vertigo most every time she is driving. Denies facial numbness or weakness. Chronic sinus problems but not worse than usual. Also some headaches which go along with this which is not typical for her. She denies weight change. She denies weakness in her arms or legs. No change to her vision and has seen her eye doctor in the last month with good report. She denies cold or flu around the time of onset of daily symptoms and no other changes she can recall. No injury or medication changes. She is taking zyrtec and flonase daily for allergies and they are under decent control.  The next problem is some dark patches on her thighs. She has had these off and on for some time and they are worse in the last year. She saw a dermatologist years ago who did a biopsy of one and told her it was a vitamin deficiency but not what kind. They start as small red itching patches. They then turn dark and then eventually go away into nothing. They do not leave scars.   Review of Systems  Constitutional: Positive for activity change. Negative for appetite change, chills, fatigue, fever and unexpected weight change.  HENT: Negative.   Eyes: Negative.   Respiratory: Negative.   Cardiovascular: Negative.   Gastrointestinal: Negative.   Musculoskeletal: Negative.   Skin: Positive for color change and rash.  Neurological: Positive for dizziness and headaches. Negative for tremors, seizures, syncope, facial asymmetry, weakness, light-headedness and numbness.    Psychiatric/Behavioral: Negative.       Objective:   Physical Exam  Constitutional: She is oriented to person, place, and time. She appears well-developed and well-nourished.  HENT:  Head: Normocephalic and atraumatic.  Right Ear: External ear normal.  Left Ear: External ear normal.  Nose: Nose normal.  Mouth/Throat: Oropharynx is clear and moist.  Head non-tender and no temporal tenderness  Eyes: EOM are normal. Pupils are equal, round, and reactive to light.  Neck: Normal range of motion. Neck supple. No JVD present. No thyromegaly present.  Cardiovascular: Normal rate and regular rhythm.   Pulmonary/Chest: Effort normal and breath sounds normal.  Abdominal: Soft. Bowel sounds are normal.  Musculoskeletal: She exhibits no edema.  Lymphadenopathy:    She has no cervical adenopathy.  Neurological: She is alert and oriented to person, place, and time. No cranial nerve deficit. Coordination normal.  Skin: Skin is warm and dry.  Several patches with 1 cm circular dark lesions on the inner thighs right and left leg, not raised and border even, color even, not tender and no redness surrounding  Psychiatric: She has a normal mood and affect.   Vitals:   02/13/17 0904  BP: 124/72  Pulse: (!) 101  Temp: 98.5 F (36.9 C)  TempSrc: Oral  SpO2: 98%  Weight: 155 lb (70.3 kg)  Height: 5\' 1"  (1.549 m)      Assessment & Plan:

## 2017-02-13 NOTE — Progress Notes (Signed)
Pre visit review using our clinic review tool, if applicable. No additional management support is needed unless otherwise documented below in the visit note. 

## 2017-02-13 NOTE — Patient Instructions (Signed)
We will get you in with the rehab for the vertigo and also get you an MRI of the brain to check.

## 2017-02-13 NOTE — Assessment & Plan Note (Signed)
Checking vitamin levels today and adjust as needed. She does not need cream today. Prior biopsy with dermatology and told had vitamin deficiency.

## 2017-02-16 ENCOUNTER — Other Ambulatory Visit: Payer: Self-pay | Admitting: Internal Medicine

## 2017-02-16 MED FILL — DICLOFENAC SOD 75 MG TAB EC: 75 | 30 days supply | Qty: 60 | Fill #5

## 2017-02-21 ENCOUNTER — Ambulatory Visit (INDEPENDENT_AMBULATORY_CARE_PROVIDER_SITE_OTHER): Payer: 59 | Admitting: Internal Medicine

## 2017-02-21 ENCOUNTER — Ambulatory Visit
Admission: RE | Admit: 2017-02-21 | Discharge: 2017-02-21 | Disposition: A | Discharge status: Home / Self Care | Payer: 59 | Source: Ambulatory Visit | Attending: Internal Medicine | Admitting: Internal Medicine

## 2017-02-21 ENCOUNTER — Encounter: Payer: Self-pay | Admitting: Internal Medicine

## 2017-02-21 ENCOUNTER — Other Ambulatory Visit (INDEPENDENT_AMBULATORY_CARE_PROVIDER_SITE_OTHER): Payer: 59

## 2017-02-21 VITALS — BP 138/98 | HR 87 | Temp 98.6°F | Resp 12 | Ht 61.0 in | Wt 160.0 lb

## 2017-02-21 DIAGNOSIS — R42 Dizziness and giddiness: Secondary | ICD-10-CM

## 2017-02-21 DIAGNOSIS — E785 Hyperlipidemia, unspecified: Secondary | ICD-10-CM

## 2017-02-21 DIAGNOSIS — R7989 Other specified abnormal findings of blood chemistry: Secondary | ICD-10-CM

## 2017-02-21 DIAGNOSIS — R7301 Impaired fasting glucose: Secondary | ICD-10-CM

## 2017-02-21 DIAGNOSIS — Z23 Encounter for immunization: Secondary | ICD-10-CM | POA: Diagnosis not present

## 2017-02-21 DIAGNOSIS — I10 Essential (primary) hypertension: Secondary | ICD-10-CM

## 2017-02-21 DIAGNOSIS — Z0000 Encounter for general adult medical examination without abnormal findings: Secondary | ICD-10-CM | POA: Diagnosis not present

## 2017-02-21 LAB — HEMOGLOBIN A1C: HEMOGLOBIN A1C: 6 % (ref 4.6–6.5)

## 2017-02-21 LAB — COMPREHENSIVE METABOLIC PANEL
ALT: 20 U/L (ref 0–35)
AST: 20 U/L (ref 0–37)
Albumin: 4 g/dL (ref 3.5–5.2)
Alkaline Phosphatase: 77 U/L (ref 39–117)
BILIRUBIN TOTAL: 0.6 mg/dL (ref 0.2–1.2)
BUN: 12 mg/dL (ref 6–23)
CO2: 28 meq/L (ref 19–32)
CREATININE: 0.66 mg/dL (ref 0.40–1.20)
Calcium: 9.2 mg/dL (ref 8.4–10.5)
Chloride: 110 mEq/L (ref 96–112)
GFR: 115.54 mL/min (ref >60.00)
Glucose, Bld: 90 mg/dL (ref 70–99)
Potassium: 3.8 mEq/L (ref 3.5–5.1)
SODIUM: 140 meq/L (ref 135–145)
Total Protein: 7.1 g/dL (ref 6.0–8.3)

## 2017-02-21 LAB — LIPID PANEL
CHOL/HDL RATIO: 6
CHOLESTEROL: 254 mg/dL — AB (ref 0–200)
HDL: 43.8 mg/dL (ref >39.00)
NonHDL: 210.16
Triglycerides: 209 mg/dL — ABNORMAL HIGH (ref 0.0–149.0)
VLDL: 41.8 mg/dL — ABNORMAL HIGH (ref 0.0–40.0)

## 2017-02-21 LAB — CBC
HEMATOCRIT: 38.4 % (ref 36.0–46.0)
HEMOGLOBIN: 12.8 g/dL (ref 12.0–15.0)
MCHC: 33.2 g/dL (ref 30.0–36.0)
MCV: 95.1 fl (ref 78.0–100.0)
Platelets: 212 10*3/uL (ref 150.0–400.0)
RBC: 4.04 Mil/uL (ref 3.87–5.11)
RDW: 13.6 % (ref 11.5–15.5)
WBC: 6.9 10*3/uL (ref 4.0–10.5)

## 2017-02-21 LAB — LDL CHOLESTEROL, DIRECT: LDL DIRECT: 175 mg/dL

## 2017-02-21 MED ORDER — MECLIZINE HCL 12.5 MG PO TABS: 12.5000 mg | ORAL_TABLET | Freq: Three times a day (TID) | ORAL | 0 refills | Status: DC | PRN

## 2017-02-21 MED ORDER — ONDANSETRON HCL 8 MG PO TABS
8.0000 mg | ORAL_TABLET | Freq: Three times a day (TID) | ORAL | 0 refills | Status: DC | PRN
Start: 2017-02-21 — End: 2020-07-08

## 2017-02-21 MED ORDER — BENZONATATE 200 MG PO CAPS: 200.0000 mg | ORAL_CAPSULE | Freq: Three times a day (TID) | ORAL | 1 refills | Status: DC | PRN

## 2017-02-21 MED FILL — ONDANSETRON HCL 8 MG TABLET: 8 | 20 days supply | Qty: 60 | Fill #0

## 2017-02-21 MED FILL — BENZONATATE 200 MG CAPSULE: 200 | 30 days supply | Qty: 90 | Fill #0

## 2017-02-21 NOTE — Assessment & Plan Note (Signed)
Mammogram is up to date but for some reason epic not capturing, will investigate. Flu and tetanus and colonoscopy up to date. Declines HIV screening. Given prevnar 13 today. Counseled about sun safety and mole surveillance. Given screening recommendations.

## 2017-02-21 NOTE — Assessment & Plan Note (Signed)
BP above goal and recheck normal on her amlodipine/valsartan. Checking CMP and adjust as needed.

## 2017-02-21 NOTE — Patient Instructions (Signed)
We have given you the pneumonia shot today prevnar.   We are checking the labs and will send you the results.   Health Maintenance, Female Adopting a healthy lifestyle and getting preventive care can go a long way to promote health and wellness. Talk with your health care provider about what schedule of regular examinations is right for you. This is a good chance for you to check in with your provider about disease prevention and staying healthy. In between checkups, there are plenty of things you can do on your own. Experts have done a lot of research about which lifestyle changes and preventive measures are most likely to keep you healthy. Ask your health care provider for more information. Weight and diet Eat a healthy diet  Be sure to include plenty of vegetables, fruits, low-fat dairy products, and lean protein.  Do not eat a lot of foods high in solid fats, added sugars, or salt.  Get regular exercise. This is one of the most important things you can do for your health.  Most adults should exercise for at least 150 minutes each week. The exercise should increase your heart rate and make you sweat (moderate-intensity exercise).  Most adults should also do strengthening exercises at least twice a week. This is in addition to the moderate-intensity exercise. Maintain a healthy weight  Body mass index (BMI) is a measurement that can be used to identify possible weight problems. It estimates body fat based on height and weight. Your health care provider can help determine your BMI and help you achieve or maintain a healthy weight.  For females 67 years of age and older:  A BMI below 18.5 is considered underweight.  A BMI of 18.5 to 24.9 is normal.  A BMI of 25 to 29.9 is considered overweight.  A BMI of 30 and above is considered obese. Watch levels of cholesterol and blood lipids  You should start having your blood tested for lipids and cholesterol at 65 years of age, then have  this test every 5 years.  You may need to have your cholesterol levels checked more often if:  Your lipid or cholesterol levels are high.  You are older than 65 years of age.  You are at high risk for heart disease. Cancer screening Lung Cancer  Lung cancer screening is recommended for adults 33-61 years old who are at high risk for lung cancer because of a history of smoking.  A yearly low-dose CT scan of the lungs is recommended for people who:  Currently smoke.  Have quit within the past 15 years.  Have at least a 30-pack-year history of smoking. A pack year is smoking an average of one pack of cigarettes a day for 1 year.  Yearly screening should continue until it has been 15 years since you quit.  Yearly screening should stop if you develop a health problem that would prevent you from having lung cancer treatment. Breast Cancer  Practice breast self-awareness. This means understanding how your breasts normally appear and feel.  It also means doing regular breast self-exams. Let your health care provider know about any changes, no matter how small.  If you are in your 20s or 30s, you should have a clinical breast exam (CBE) by a health care provider every 1-3 years as part of a regular health exam.  If you are 58 or older, have a CBE every year. Also consider having a breast X-ray (mammogram) every year.  If you have a family  history of breast cancer, talk to your health care provider about genetic screening.  If you are at high risk for breast cancer, talk to your health care provider about having an MRI and a mammogram every year.  Breast cancer gene (BRCA) assessment is recommended for women who have family members with BRCA-related cancers. BRCA-related cancers include:  Breast.  Ovarian.  Tubal.  Peritoneal cancers.  Results of the assessment will determine the need for genetic counseling and BRCA1 and BRCA2 testing. Cervical Cancer  Your health care  provider may recommend that you be screened regularly for cancer of the pelvic organs (ovaries, uterus, and vagina). This screening involves a pelvic examination, including checking for microscopic changes to the surface of your cervix (Pap test). You may be encouraged to have this screening done every 3 years, beginning at age 76.  For women ages 39-65, health care providers may recommend pelvic exams and Pap testing every 3 years, or they may recommend the Pap and pelvic exam, combined with testing for human papilloma virus (HPV), every 5 years. Some types of HPV increase your risk of cervical cancer. Testing for HPV may also be done on women of any age with unclear Pap test results.  Other health care providers may not recommend any screening for nonpregnant women who are considered low risk for pelvic cancer and who do not have symptoms. Ask your health care provider if a screening pelvic exam is right for you.  If you have had past treatment for cervical cancer or a condition that could lead to cancer, you need Pap tests and screening for cancer for at least 20 years after your treatment. If Pap tests have been discontinued, your risk factors (such as having a new sexual partner) need to be reassessed to determine if screening should resume. Some women have medical problems that increase the chance of getting cervical cancer. In these cases, your health care provider may recommend more frequent screening and Pap tests. Colorectal Cancer  This type of cancer can be detected and often prevented.  Routine colorectal cancer screening usually begins at 65 years of age and continues through 65 years of age.  Your health care provider may recommend screening at an earlier age if you have risk factors for colon cancer.  Your health care provider may also recommend using home test kits to check for hidden blood in the stool.  A small camera at the end of a tube can be used to examine your colon directly  (sigmoidoscopy or colonoscopy). This is done to check for the earliest forms of colorectal cancer.  Routine screening usually begins at age 3.  Direct examination of the colon should be repeated every 5-10 years through 65 years of age. However, you may need to be screened more often if early forms of precancerous polyps or small growths are found. Skin Cancer  Check your skin from head to toe regularly.  Tell your health care provider about any new moles or changes in moles, especially if there is a change in a mole's shape or color.  Also tell your health care provider if you have a mole that is larger than the size of a pencil eraser.  Always use sunscreen. Apply sunscreen liberally and repeatedly throughout the day.  Protect yourself by wearing long sleeves, pants, a wide-brimmed hat, and sunglasses whenever you are outside. Heart disease, diabetes, and high blood pressure  High blood pressure causes heart disease and increases the risk of stroke. High blood pressure  is more likely to develop in:  People who have blood pressure in the high end of the normal range (130-139/85-89 mm Hg).  People who are overweight or obese.  People who are African American.  If you are 19-4 years of age, have your blood pressure checked every 3-5 years. If you are 76 years of age or older, have your blood pressure checked every year. You should have your blood pressure measured twice-once when you are at a hospital or clinic, and once when you are not at a hospital or clinic. Record the average of the two measurements. To check your blood pressure when you are not at a hospital or clinic, you can use:  An automated blood pressure machine at a pharmacy.  A home blood pressure monitor.  If you are between 63 years and 89 years old, ask your health care provider if you should take aspirin to prevent strokes.  Have regular diabetes screenings. This involves taking a blood sample to check your  fasting blood sugar level.  If you are at a normal weight and have a low risk for diabetes, have this test once every three years after 65 years of age.  If you are overweight and have a high risk for diabetes, consider being tested at a younger age or more often. Preventing infection Hepatitis B  If you have a higher risk for hepatitis B, you should be screened for this virus. You are considered at high risk for hepatitis B if:  You were born in a country where hepatitis B is common. Ask your health care provider which countries are considered high risk.  Your parents were born in a high-risk country, and you have not been immunized against hepatitis B (hepatitis B vaccine).  You have HIV or AIDS.  You use needles to inject street drugs.  You live with someone who has hepatitis B.  You have had sex with someone who has hepatitis B.  You get hemodialysis treatment.  You take certain medicines for conditions, including cancer, organ transplantation, and autoimmune conditions. Hepatitis C  Blood testing is recommended for:  Everyone born from [birth year] through 1965.  Anyone with known risk factors for hepatitis C. Sexually transmitted infections (STIs)  You should be screened for sexually transmitted infections (STIs) including gonorrhea and chlamydia if:  You are sexually active and are younger than 65 years of age.  You are older than 65 years of age and your health care provider tells you that you are at risk for this type of infection.  Your sexual activity has changed since you were last screened and you are at an increased risk for chlamydia or gonorrhea. Ask your health care provider if you are at risk.  If you do not have HIV, but are at risk, it may be recommended that you take a prescription medicine daily to prevent HIV infection. This is called pre-exposure prophylaxis (PrEP). You are considered at risk if:  You are sexually active and do not regularly use condoms or  know the HIV status of your partner(s).  You take drugs by injection.  You are sexually active with a partner who has HIV. Talk with your health care provider about whether you are at high risk of being infected with HIV. If you choose to begin PrEP, you should first be tested for HIV. You should then be tested every 3 months for as long as you are taking PrEP. Pregnancy  If you are premenopausal and you may become  pregnant, ask your health care provider about preconception counseling.  If you may become pregnant, take 400 to 800 micrograms (mcg) of folic acid every day.  If you want to prevent pregnancy, talk to your health care provider about birth control (contraception). Osteoporosis and menopause  Osteoporosis is a disease in which the bones lose minerals and strength with aging. This can result in serious bone fractures. Your risk for osteoporosis can be identified using a bone density scan.  If you are 27 years of age or older, or if you are at risk for osteoporosis and fractures, ask your health care provider if you should be screened.  Ask your health care provider whether you should take a calcium or vitamin D supplement to lower your risk for osteoporosis.  Menopause may have certain physical symptoms and risks.  Hormone replacement therapy may reduce some of these symptoms and risks. Talk to your health care provider about whether hormone replacement therapy is right for you. Follow these instructions at home:  Schedule regular health, dental, and eye exams.  Stay current with your immunizations.  Do not use any tobacco products including cigarettes, chewing tobacco, or electronic cigarettes.  If you are pregnant, do not drink alcohol.  If you are breastfeeding, limit how much and how often you drink alcohol.  Limit alcohol intake to no more than 1 drink per day for nonpregnant women. One drink equals 12 ounces of beer, 5 ounces of wine, or 1 ounces of hard  liquor.  Do not use street drugs.  Do not share needles.  Ask your health care provider for help if you need support or information about quitting drugs.  Tell your health care provider if you often feel depressed.  Tell your health care provider if you have ever been abused or do not feel safe at home. This information is not intended to replace advice given to you by your health care provider. Make sure you discuss any questions you have with your health care provider. Document Released: 05/30/2011 Document Revised: 04/21/2016 Document Reviewed: 08/18/2015 Elsevier Interactive Patient Education  2017 [employer].

## 2017-02-21 NOTE — Progress Notes (Signed)
Pre visit review using our clinic review tool, if applicable. No additional management support is needed unless otherwise documented below in the visit note. 

## 2017-02-21 NOTE — Assessment & Plan Note (Signed)
Checking lipid panel and adjust as needed, not on meds currently. LDL goal <130.

## 2017-02-21 NOTE — Progress Notes (Signed)
   Subjective:    Patient ID: Gina Jefferson, female    DOB: 1951/02/14, 66 y.o.   MRN: [id number]  HPI The patient is a 66 YO female coming in for physical. No new concerns.  PMH, [facility], social history reviewed and updated.   Review of Systems  Constitutional: Negative.   HENT: Negative.   Eyes: Negative.   Respiratory: Negative for cough, chest tightness and shortness of breath.   Cardiovascular: Negative for chest pain, palpitations and leg swelling.  Gastrointestinal: Negative for abdominal distention, abdominal pain, constipation, diarrhea, nausea and vomiting.  Musculoskeletal: Negative.   Skin: Negative.   Neurological: Negative.   Psychiatric/Behavioral: Negative.       Objective:   Physical Exam  Constitutional: She is oriented to person, place, and time. She appears well-developed and well-nourished.  HENT:  Head: Normocephalic and atraumatic.  Eyes: EOM are normal.  Neck: Normal range of motion.  Cardiovascular: Normal rate and regular rhythm.   Pulmonary/Chest: Effort normal and breath sounds normal. No respiratory distress. She has no wheezes. She has no rales.  Abdominal: Soft. Bowel sounds are normal. She exhibits no distension. There is no tenderness. There is no rebound.  Musculoskeletal: She exhibits no edema.  Neurological: She is alert and oriented to person, place, and time. Coordination normal.  Skin: Skin is warm and dry.  Psychiatric: She has a normal mood and affect.   Vitals:   02/21/17 1427  BP: (!) 142/98  Pulse: 87  Resp: 12  Temp: 98.6 F (37 C)  TempSrc: Oral  SpO2: 98%  Weight: 160 lb (72.6 kg)  Height: 5\' 1"  (1.549 m)      Assessment & Plan:  Prevnar 13 given at visit

## 2017-02-21 NOTE — Assessment & Plan Note (Signed)
Checking HgA1c, last 6.2 and adjust as needed. Weight is stable and reminded about lifestyle changes.

## 2017-02-22 ENCOUNTER — Encounter: Payer: Self-pay | Admitting: Internal Medicine

## 2017-03-10 ENCOUNTER — Ambulatory Visit: Payer: 59 | Attending: Internal Medicine | Admitting: Rehabilitative and Restorative Service Providers"

## 2017-03-10 DIAGNOSIS — R42 Dizziness and giddiness: Secondary | ICD-10-CM | POA: Insufficient documentation

## 2017-03-10 DIAGNOSIS — R2689 Other abnormalities of gait and mobility: Secondary | ICD-10-CM | POA: Insufficient documentation

## 2017-03-10 NOTE — Therapy (Signed)
[facility] [address] [place], [place], [address] Phone: [phone]   Fax:  [phone]  Physical Therapy Treatment  Patient Details  Name: Gina Jefferson MRN: [id number] Date of Birth: 23-Jan-1951 Referring Provider: Pricilla Holm, MD  Encounter Date: 03/10/2017      PT End of Session - 03/10/17 1227    Visit Number 1   Number of Visits 4   Date for PT Re-Evaluation 04/09/17   Authorization Type G code every 10th visit   PT Start Time 1020   PT Stop Time 1100   PT Time Calculation (min) 40 min   Activity Tolerance Patient tolerated treatment well   Behavior During Therapy [facility] for tasks assessed/performed      Past Medical History:  Diagnosis Date  . Anxiety   . Arthritis    knee, c-spine  . Carpal tunnel syndrome of right wrist 10/2011   s/p surgical release  . Colon polyp   . GERD   . Hx of adenomatous polyp of colon 2010  . Hyperlipidemia   . HYPERTENSION    under control; has been on med. x 15 yrs.  . Seasonal allergies     Past Surgical History:  Procedure Laterality Date  . ABDOMINAL HYSTERECTOMY     complete  . APPENDECTOMY    . BREAST REDUCTION SURGERY  2016  . CARPAL TUNNEL RELEASE  11/24/2011   Procedure: CARPAL TUNNEL RELEASE;  Surgeon: Garald Balding, MD;  Location: [facility];  Service: Orthopedics;  Laterality: Right;  CARPAL TUNNEL REL ON RIGHT   . CESAREAN SECTION    . CHOLECYSTECTOMY    . COLONOSCOPY    . OOPHORECTOMY    . OVARIAN CYST REMOVAL    . POLYPECTOMY    . TONSILLECTOMY    . WISDOM TOOTH EXTRACTION      There were no vitals filed for this visit.      Subjective Assessment - 03/10/17 1023    Subjective The patient reports onset of vertigo 20 years ago as an "attack" when walking feeling like having an "outer body experience" noting dizziness and disorientation x 20 minutes, then resolved.  Over time, the patient noted symptoms 1x/year and  then 1x/ every 9 months, then every 6 months and now daily symptoms (x the last 4 months).  The patient's recent symptoms are described as worsening, daily episodes of dizziness.  Symtpoms are worse with bending forward and when riding in the car.  She experiences nausea, constant tinnitus bilaterally, and headaches (attributes it to sinuses--constant in nature), no h/o migraines.  Hearing is WNLs.   Patient Stated Goals To be able to walk without running into walls, reduce dizziness when bending down.    Currently in Pain? Yes   Pain Location Head   Pain Descriptors / Indicators Headache   Pain Onset More than a month ago   Pain Frequency Constant   Aggravating Factors  unsure   Pain Relieving Factors unsure            [facility] PT Assessment - 03/10/17 1029      Assessment   Medical Diagnosis Vertigo   Referring Provider Pricilla Holm, MD   Onset Date/Surgical Date --  estimates worsening over past 4 months   Prior Therapy none     Precautions   Precautions Fall     Restrictions   Weight Bearing Restrictions No     Balance Screen   Has the patient fallen in the  past 6 months No   Has the patient had a decrease in activity level because of a fear of falling?  No   Is the patient reluctant to leave their home because of a fear of falling?  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            Vestibular Assessment - 03/10/17 1032      Vestibular Assessment   General Observation Walks independently into clinic      Symptom Behavior   Type of Dizziness Blurred vision  headache; notes some external movement   Frequency of Dizziness daily   Duration of Dizziness 2-3 minutes   Aggravating Factors Forward bending;Sitting in moving car   Relieving Factors Head stationary     Occulomotor Exam   Occulomotor Alignment Abnormal  right eye ocular tilt (mild) with L eye hypertropia   Spontaneous Absent   Gaze-induced Absent   Saccades Intact   Comment Patient gets intermittent double vision that lasts 2-3 minutes.  Cover/uncover test WNLs.  Patient notes she has cateracts bilaterally that will be removed this summer.     Vestibulo-Occular Reflex   VOR 1 Head Only (x 1 viewing) Self regulated pace provokes dizziness and difficulty to maintain gaze   Comment Head impulse test=positive bilaterally as patient is unable to maintain fixation at slow pace     Visual Acuity   Static Line 7   Dynamic Line 3  4 line difference indicating dec'd VOR     Positional Testing   Dix-Hallpike Dix-Hallpike Right;Dix-Hallpike Left  return to sit provokes sensation of lightheadedness   Horizontal Canal Testing Horizontal Canal Right;Horizontal Canal Left     Dix-Hallpike Right   Dix-Hallpike Right Duration "a little bit" of dizziness x <5 seconds   Dix-Hallpike Right Symptoms No nystagmus     Dix-Hallpike Left   Dix-Hallpike Left Duration "a litle bit" of dizziness   Dix-Hallpike Left Symptoms No nystagmus     Horizontal Canal Right   Horizontal Canal Right Duration none   Horizontal Canal Right Symptoms Normal     Horizontal Canal Left   Horizontal Canal Left Duration none   Horizontal Canal Left Symptoms Normal     Positional Sensitivities   Head Turning x 5 Moderate dizziness  Increase from "mild" 1/5 up to "moderate" 3/5   Head Nodding x 5 Mild dizziness  remains at "mild" level of symptom; no change from baseline   Positional Sensitivities Comments Gait with vertical head turns provokes 5/10 symptoms  and horizontal head turns provokes 7/10 symptoms.                    Vestibular Treatment/Exercise - 03/10/17 1047      Vestibular Treatment/Exercise   Vestibular Treatment Provided Habituation;Gaze   Habituation Exercises Standing Horizontal Head Turns   Gaze Exercises X1 Viewing Horizontal     Standing Horizontal Head Turns   Number of Reps  5   Symptom Description  standing  in corner with feet together     X1 Viewing Horizontal   Foot Position standing near support   Comments x 10 reps with 7/10 symptoms.               PT Education - 03/10/17 1058    Education provided Yes   Education Details HEP: gaze x 1 viewing, standing with feet together with head motion.   Person(s) Educated Patient   Methods Explanation;Demonstration;Handout   Comprehension Verbalized understanding;Returned demonstration             PT Long Term Goals - 03/10/17 1227      PT LONG TERM GOAL #1   Title The patient will be indep with HEP for gaze adaptation, balance, and habituation.   Baseline Target date 04/09/2017   Time 4   Period Weeks     PT LONG TERM GOAL #2   Title The patient will reduce DHI from 34% to < or equal to 20% to demo improved self perception of dizziness.   Baseline Target date 04/09/17   Time 4   Period Weeks     PT LONG TERM GOAL #3   Title The patient will tolerate gaze x 1 x 60 seconds with dizziness remaining < or equal to 2/10 change from baseline.   Baseline Target date 04/09/17   Time 4   Period Weeks     PT LONG TERM GOAL #4   Title The patient will improve SVA versus DVA to 2 line difference.   Baseline Target date 04/09/17   Time 4   Period Weeks     PT LONG TERM GOAL #5   Title The patient will be further assessed on FGA and goal to follow.   Baseline Target date 04/09/17   Time 4   Period Weeks               Plan - 03/10/17 1237    Clinical Impression Statement The patient is a 66 year old female presenting to OP rehab  with diminished VOR per positive head impulse test and abnormal static vs dynamic visual acuity.  The patient also has motion sensitivity that will be addressed through habituation exercises.  The patient has imbalance and dec'd dynamic gait noting worsening symptoms when performing visual scanning during functional tasks.  PT to address deficits to optimize functional mobility.   Rehab Potential Good   PT Frequency 1x / week   PT Duration 4 weeks   PT Treatment/Interventions ADLs/Self Care Home Management;Therapeutic activities;Therapeutic exercise;Gait training;Functional mobility training;Patient/family education;Neuromuscular re-education;Canalith Repostioning;Vestibular   PT Next Visit Plan Check HEP; progress habituation, progress gaze x 1.  Balance and FGA.   Consulted and Agree with Plan of Care Patient      Patient will benefit from skilled therapeutic intervention in order to improve the following deficits and impairments:  Abnormal gait, Decreased balance, Dizziness, Difficulty walking, Impaired vision/preception  Visit Diagnosis: Dizziness and giddiness  Other abnormalities of gait and mobility     Problem List Patient Active Problem List   Diagnosis Date Noted  . Skin rash 02/13/2017  . Vertigo 12/11/2015  . Routine general medical examination at a health care facility 02/12/2015  . Insomnia   . Impaired fasting blood sugar 01/21/2011  . Allergic rhinitis 09/24/2010  . GRIEF REACTION 10/16/2009  . Hyperlipidemia 06/23/2009  . Essential hypertension 06/23/2009  . 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    Zong Mcquarrie, PT 03/10/2017, 12:40 PM  [facility] [address]  [place], [place], [address] Phone: [phone]   Fax:  [phone]  Name: Luevenia S Wold MRN: [id number] Date of Birth: Jul 27, 1951

## 2017-03-10 NOTE — Patient Instructions (Addendum)
Gaze Stabilization - Tip Card  1.Target must remain in focus, not blurry, and appear stationary while head is in motion. 2.Perform exercises with small head movements (45 to either side of midline). 3.Increase speed of head motion so long as target is in focus. 4.If you wear eyeglasses, be sure you can see target through lens (therapist will give specific instructions for bifocal / progressive lenses). 5.These exercises may provoke dizziness or nausea. Work through these symptoms. If too dizzy, slow head movement slightly. Rest between each exercise. 6.Exercises demand concentration; avoid distractions. 7.For safety, perform standing exercises close to a counter, wall, corner, or next to someone.  Copyright  VHI. All rights reserved.   Gaze Stabilization - Standing Feet Apart   Feet shoulder width apart, keeping eyes on target on wall 3 feet away, tilt head down slightly and move head side to side x 10 repetitions.  Rest and repeat 2 times in a session.  Then perform moving head up and down x 10 times.  Repeating 2 times in a session. Do 2-3 sessions per day.   Copyright  VHI. All rights reserved.   Feet Together, Head Motion - Eyes Open    With eyes open, feet together, move head slowly: SIDE TO SIDE X 5 REPS.  Rest and repeat. Do __2__ sessions per day.  Copyright  VHI. All rights reserved.

## 2017-03-16 ENCOUNTER — Ambulatory Visit: Payer: 59 | Admitting: Rehabilitative and Restorative Service Providers"

## 2017-03-16 DIAGNOSIS — R2689 Other abnormalities of gait and mobility: Secondary | ICD-10-CM

## 2017-03-16 DIAGNOSIS — R42 Dizziness and giddiness: Secondary | ICD-10-CM | POA: Diagnosis not present

## 2017-03-16 NOTE — Patient Instructions (Addendum)
Gaze Stabilization - Tip Card  1.Target must remain in focus, not blurry, and appear stationary while head is in motion. 2.Perform exercises with small head movements (45 to either side of midline). 3.Increase speed of head motion so long as target is in focus. 4.If you wear eyeglasses, be sure you can see target through lens (therapist will give specific instructions for bifocal / progressive lenses). 5.These exercises may provoke dizziness or nausea. Work through these symptoms. If too dizzy, slow head movement slightly. Rest between each exercise. 6.Exercises demand concentration; avoid distractions. 7.For safety, perform standing exercises close to a counter, wall, corner, or next to someone.  Copyright  VHI. All rights reserved.   Gaze Stabilization - Standing Feet Apart   Feet shoulder width apart, keeping eyes on target on wall 3 feet away, tilt head down slightly and move head side to side x 30 seconds.  Do 2-3 sessions per day.   Copyright  VHI. All rights reserved.   Feet Together, Head Motion - Eyes Open    With eyes open, feet together, move head slowly: SIDE TO SIDE X 10 REPS.  Rest and repeat. Do __2__ sessions per day.  Copyright  VHI. All rights reserved.   Axial Extension (Chin Tuck)    Pull chin in and lengthen back of neck. Hold __3__ seconds while counting out loud. Repeat __10__ times. Do _1-2___ sessions per day.  http://gt2.exer.us/450   Copyright  VHI. All rights reserved.   Feet Apart (Compliant Surface) Varied Arm Positions - Eyes Closed    Stand on compliant surface: __pillow______ with feet shoulder width apart and arms at your side.. Close eyes and visualize upright position. Hold___30_ seconds. Repeat __2__ times per session. Do _1-2___ sessions per day.  Copyright  VHI. All rights reserved.

## 2017-03-16 NOTE — Therapy (Signed)
[facility] [address] [place], [place], [address] Phone: [phone]   Fax:  [phone]  Physical Therapy Treatment  Patient Details  Name: Gina Jefferson MRN: [id number] Date of Birth: 03-02-1951 Referring Provider: Pricilla Holm, MD  Encounter Date: 03/16/2017      PT End of Session - 03/16/17 1220    Visit Number 2   Number of Visits 4   Date for PT Re-Evaluation 04/09/17   Authorization Type G code every 10th visit   PT Start Time 1148   PT Stop Time 1218   PT Time Calculation (min) 30 min   Activity Tolerance Patient tolerated treatment well   Behavior During Therapy [facility] for tasks assessed/performed      Past Medical History:  Diagnosis Date  . Anxiety   . Arthritis    knee, c-spine  . Carpal tunnel syndrome of right wrist 10/2011   s/p surgical release  . Colon polyp   . GERD   . Hx of adenomatous polyp of colon 2010  . Hyperlipidemia   . HYPERTENSION    under control; has been on med. x 15 yrs.  . Seasonal allergies     Past Surgical History:  Procedure Laterality Date  . ABDOMINAL HYSTERECTOMY     complete  . APPENDECTOMY    . BREAST REDUCTION SURGERY  2016  . CARPAL TUNNEL RELEASE  11/24/2011   Procedure: CARPAL TUNNEL RELEASE;  Surgeon: Garald Balding, MD;  Location: [facility];  Service: Orthopedics;  Laterality: Right;  CARPAL TUNNEL REL ON RIGHT   . CESAREAN SECTION    . CHOLECYSTECTOMY    . COLONOSCOPY    . OOPHORECTOMY    . OVARIAN CYST REMOVAL    . POLYPECTOMY    . TONSILLECTOMY    . WISDOM TOOTH EXTRACTION      There were no vitals filed for this visit.      Subjective Assessment - 03/16/17 1150    Subjective The patient notes that her symptoms have improved significantly.  She reports the main time she still notes some symptoms of dizziness is after riding for long periods in the car.  She describes a sensation of virtual motion.    Patient  Stated Goals To be able to walk without running into walls, reduce dizziness when bending down.    Currently in Pain? Yes  appears related to allergies/sinuses per patient   Pain Location Head   Pain Descriptors / Indicators Headache            [facility] PT Assessment - 03/16/17 1200      Functional Gait  Assessment   Gait assessed  Yes   Gait Level Surface Walks 20 ft in less than 7 sec but greater than 5.5 sec, uses assistive device, slower speed, mild gait deviations, or deviates 6-10 in outside of the 12 in walkway width.  6.53 seconds   Change in Gait Speed Able to smoothly change walking speed without loss of balance or gait deviation. Deviate no more than 6 in outside of the 12 in walkway width.   Gait with Horizontal Head Turns Performs head turns smoothly with slight change in gait velocity (eg, minor disruption to smooth gait path), deviates 6-10 in outside 12 in walkway width, or uses an assistive device.   Gait with Vertical Head Turns Performs task with slight change in gait velocity (eg, minor disruption to smooth gait path), deviates 6 - 10 in outside 12 in  walkway width or uses assistive device   Gait and Pivot Turn Pivot turns safely within 3 sec and stops quickly with no loss of balance.   Step Over Obstacle Is able to step over 2 stacked shoe boxes taped together (9 in total height) without changing gait speed. No evidence of imbalance.   Gait with Narrow Base of Support Is able to ambulate for 10 steps heel to toe with no staggering.   Gait with Eyes Closed Walks 20 ft, uses assistive device, slower speed, mild gait deviations, deviates 6-10 in outside 12 in walkway width. Ambulates 20 ft in less than 9 sec but greater than 7 sec.   Ambulating Backwards Walks 20 ft, no assistive devices, good speed, no evidence for imbalance, normal gait   Steps Alternating feet, no rail.   Total Score 26   FGA comment: 26/30                     [facility] Adult PT  Treatment/Exercise - 03/16/17 1223      Neuro Re-ed    Neuro Re-ed Details  Corner balance activities performing compliant standing with eyes open and eyes closed.      Exercises   Exercises Other Exercises   Other Exercises  chin tucks standing x 10 reps         Vestibular Treatment/Exercise - 03/16/17 1152      Vestibular Treatment/Exercise   Vestibular Treatment Provided Gaze;Habituation   Habituation Exercises Standing Horizontal Head Turns   Gaze Exercises X1 Viewing Horizontal;X1 Viewing Vertical     Standing Horizontal Head Turns   Number of Reps  10   Symptom Description  patient notes minimal sensation of dizziness, was able to progress from 5 reps at eval to 10 reps.     X1 Viewing Horizontal   Foot Position standing without UE support   Comments increased to 30 seconds with minimal/trace symptoms when stopping     X1 Viewing Vertical   Foot Position standing   Comments tolerated well x 30 seconds without symptoms, discontinued for HEP.                PT Education - 03/16/17 1213    Education provided Yes   Education Details HEP: gaze progressed, standing head turns progressed, pillow with eyes closed standing, chin tucks   Person(s) Educated Patient   Methods Explanation;Demonstration;Handout   Comprehension Verbalized understanding;Returned demonstration             PT Long Term Goals - 03/16/17 1206      PT LONG TERM GOAL #1   Title The patient will be indep with HEP for gaze adaptation, balance, and habituation.   Baseline Target date 04/09/2017   Time 4   Period Weeks   Status Achieved     PT LONG TERM GOAL #2   Title The patient will reduce DHI from 34% to < or equal to 20% to demo improved self perception of dizziness.   Baseline Target date 04/09/17  (sent via e-mail 03/16/17)   Time 4   Period Weeks     PT LONG TERM GOAL #3   Title The patient will tolerate gaze x 1 x 60 seconds with dizziness remaining < or equal to 2/10 change  from baseline.   Baseline Target date 04/09/17   Time 4   Period Weeks     PT LONG TERM GOAL #4   Title The patient will improve SVA versus DVA to 2 line  difference.   Baseline Target date 04/09/17   Time 4   Period Weeks     PT LONG TERM GOAL #5   Title The patient will be further assessed on FGA and goal to follow.   Baseline Scores 26/30 on 03/16/17   Time 4   Period Weeks   Status Achieved               Plan - 03/16/17 1221    Clinical Impression Statement The patient has significantly improved since evaluation.  PT progressed HEP and added more challenging components.  Recommended f/u in 2-3 weeks as she is progressing well with home activities and may be ready for early d/c.    PT Treatment/Interventions ADLs/Self Care Home Management;Therapeutic activities;Therapeutic exercise;Gait training;Functional mobility training;Patient/family education;Neuromuscular re-education;Canalith Repostioning;Vestibular   PT Next Visit Plan Check HEP; plan for d/c, return to prior exercises   Consulted and Agree with Plan of Care Patient      Patient will benefit from skilled therapeutic intervention in order to improve the following deficits and impairments:  Abnormal gait, Decreased balance, Dizziness, Difficulty walking, Impaired vision/preception  Visit Diagnosis: Dizziness and giddiness  Other abnormalities of gait and mobility     Problem List Patient Active Problem List   Diagnosis Date Noted  . Skin rash 02/13/2017  . Vertigo 12/11/2015  . Routine general medical examination at a health care facility 02/12/2015  . Insomnia   . Impaired fasting blood sugar 01/21/2011  . Allergic rhinitis 09/24/2010  . GRIEF REACTION 10/16/2009  . Hyperlipidemia 06/23/2009  . Essential hypertension 06/23/2009  . 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    [place], PT 03/16/2017, 2:48 PM  [facility] [address] [place], [place], [address] Phone: [phone]   Fax:  [phone]  Name: Gina Jefferson MRN: [id number] Date of Birth: 11/16/1951

## 2017-03-30 ENCOUNTER — Encounter: Payer: 59 | Admitting: Rehabilitative and Restorative Service Providers"

## 2017-04-03 ENCOUNTER — Encounter: Payer: Self-pay | Admitting: Rehabilitative and Restorative Service Providers"

## 2017-04-03 NOTE — Therapy (Signed)
[facility] [address] [place], [place], [address] Phone: [phone]   Fax:  [phone]  Patient Details  Name: Gina Jefferson MRN: [id number] Date of Birth: 05/31/1951 Referring Provider:  No ref. provider found  Encounter Date: last encounter 03/16/17  PHYSICAL THERAPY DISCHARGE SUMMARY  Visits from Start of Care: 2  Current functional level related to goals / functional outcomes:     PT Long Term Goals - 04/03/17 0924      PT LONG TERM GOAL #1   Title The patient will be indep with HEP for gaze adaptation, balance, and habituation.   Baseline Target date 04/09/2017   Time 4   Period Weeks   Status Achieved     PT LONG TERM GOAL #2   Title The patient will reduce DHI from 34% to < or equal to 20% to demo improved self perception of dizziness.   Baseline Scored 10% on DHI.   Time 4   Period Weeks   Status Achieved     PT LONG TERM GOAL #3   Title The patient will tolerate gaze x 1 x 60 seconds with dizziness remaining < or equal to 2/10 change from baseline.   Baseline Patient did not return- notes symptoms resolved via phone message.   Time 4   Period Weeks   Status Deferred     PT LONG TERM GOAL #4   Title The patient will improve SVA versus DVA to 2 line difference.   Baseline Symptoms resolved via phone message.   Time 4   Period Weeks   Status Deferred     PT LONG TERM GOAL #5   Title The patient will be further assessed on FGA and goal to follow.   Baseline Scores 26/30 on 03/16/17   Time 4   Period Weeks   Status Achieved        Remaining deficits: None per phone message to cancel remaining visits--vertigo resolved.    Education / Equipment: HEP.   Plan: Patient agrees to discharge.  Patient goals were met. Patient is being discharged due to meeting the stated rehab goals.  ?????         Thank you for the referral of this patient.  ,  MPT   , 04/03/2017, 9:23 AM  [facility] [address] [place], [place], [address] Phone: [phone]   Fax:  [phone] 

## 2017-04-05 ENCOUNTER — Other Ambulatory Visit: Payer: Self-pay | Admitting: *Deleted

## 2017-04-05 MED ORDER — AMLODIPINE BESYLATE-VALSARTAN 5-160 MG PO TABS: 1.0000 | ORAL_TABLET | Freq: Every day | ORAL | 2 refills | Status: DC

## 2017-04-05 MED FILL — AMLODIPINE-VALSARTAN 5-160: 5-160 | 90 days supply | Qty: 90 | Fill #0

## 2017-04-06 ENCOUNTER — Encounter: Payer: 59 | Admitting: Rehabilitative and Restorative Service Providers"

## 2017-05-22 DIAGNOSIS — H2512 Age-related nuclear cataract, left eye: Secondary | ICD-10-CM | POA: Diagnosis not present

## 2017-05-22 DIAGNOSIS — H25012 Cortical age-related cataract, left eye: Secondary | ICD-10-CM | POA: Diagnosis not present

## 2017-05-22 DIAGNOSIS — H2513 Age-related nuclear cataract, bilateral: Secondary | ICD-10-CM | POA: Diagnosis not present

## 2017-05-22 DIAGNOSIS — H40013 Open angle with borderline findings, low risk, bilateral: Secondary | ICD-10-CM | POA: Diagnosis not present

## 2017-05-22 DIAGNOSIS — H25013 Cortical age-related cataract, bilateral: Secondary | ICD-10-CM | POA: Diagnosis not present

## 2017-05-22 DIAGNOSIS — H40033 Anatomical narrow angle, bilateral: Secondary | ICD-10-CM | POA: Diagnosis not present

## 2017-05-22 LAB — HM DIABETES EYE EXAM

## 2017-05-22 MED FILL — DICLOFENAC SOD 75 MG TAB EC: 75 | 30 days supply | Qty: 60 | Fill #0

## 2017-05-23 ENCOUNTER — Encounter: Payer: Self-pay | Admitting: Internal Medicine

## 2017-05-23 NOTE — Progress Notes (Unsigned)
Results entered and sent to scan  

## 2017-06-06 MED FILL — OFLOXACIN 0.3% EYE DROPS: 0.3 | 25 days supply | Qty: 10 | Fill #0

## 2017-06-06 MED FILL — CYCLOPENTOLATE 1% EYE DROPS: 1 | 9 days supply | Qty: 15 | Fill #0

## 2017-06-06 MED FILL — PREDNISOLONE AC 1% EYE DROP: 1 | 35 days supply | Qty: 15 | Fill #0

## 2017-06-06 MED FILL — KETOROLAC 0.5% OPHTH SOLN: 0.5 | 12 days supply | Qty: 5 | Fill #0

## 2017-06-06 MED FILL — BRIMONIDINE 0.2% EYE DROP: 0.2 | 90 days supply | Qty: 10 | Fill #0

## 2017-06-20 DIAGNOSIS — H2512 Age-related nuclear cataract, left eye: Secondary | ICD-10-CM | POA: Diagnosis not present

## 2017-06-20 DIAGNOSIS — H25812 Combined forms of age-related cataract, left eye: Secondary | ICD-10-CM | POA: Diagnosis not present

## 2017-06-28 DIAGNOSIS — M25572 Pain in left ankle and joints of left foot: Secondary | ICD-10-CM | POA: Diagnosis not present

## 2017-07-05 MED FILL — PREDNISOLONE AC 1% EYE DROP: 1 | 35 days supply | Qty: 15 | Fill #1

## 2017-07-05 MED FILL — AMLODIPINE-VALSARTAN 5-160: 5-160 | 90 days supply | Qty: 90 | Fill #1

## 2017-07-05 MED FILL — OFLOXACIN 0.3% EYE DROPS: 0.3 | 25 days supply | Qty: 10 | Fill #1

## 2017-07-05 MED FILL — KETOROLAC 0.5% OPHTH SOLN: 0.5 | 12 days supply | Qty: 5 | Fill #1

## 2017-07-06 MED FILL — DICLOFENAC SOD 75 MG TAB EC: 75 | 30 days supply | Qty: 60 | Fill #1

## 2017-07-10 DIAGNOSIS — H2511 Age-related nuclear cataract, right eye: Secondary | ICD-10-CM | POA: Diagnosis not present

## 2017-07-10 DIAGNOSIS — H25011 Cortical age-related cataract, right eye: Secondary | ICD-10-CM | POA: Diagnosis not present

## 2017-07-10 LAB — HM DIABETES EYE EXAM

## 2017-07-11 ENCOUNTER — Encounter: Payer: Self-pay | Admitting: Internal Medicine

## 2017-07-11 NOTE — Progress Notes (Signed)
Abstracted and sent to scan  

## 2017-07-18 DIAGNOSIS — H25011 Cortical age-related cataract, right eye: Secondary | ICD-10-CM | POA: Diagnosis not present

## 2017-07-18 DIAGNOSIS — H2511 Age-related nuclear cataract, right eye: Secondary | ICD-10-CM | POA: Diagnosis not present

## 2017-08-02 ENCOUNTER — Encounter: Payer: Self-pay | Admitting: Internal Medicine

## 2017-08-02 ENCOUNTER — Ambulatory Visit (INDEPENDENT_AMBULATORY_CARE_PROVIDER_SITE_OTHER): Payer: 59 | Admitting: Internal Medicine

## 2017-08-02 VITALS — BP 124/86 | HR 85 | Temp 98.1°F | Ht 61.0 in | Wt 158.0 lb

## 2017-08-02 DIAGNOSIS — B029 Zoster without complications: Secondary | ICD-10-CM

## 2017-08-02 DIAGNOSIS — I10 Essential (primary) hypertension: Secondary | ICD-10-CM

## 2017-08-02 DIAGNOSIS — R7301 Impaired fasting glucose: Secondary | ICD-10-CM

## 2017-08-02 MED ORDER — VALACYCLOVIR HCL 1 G PO TABS: 1000.0000 mg | ORAL_TABLET | Freq: Three times a day (TID) | ORAL | 0 refills | Status: DC

## 2017-08-02 MED ORDER — TRIAMCINOLONE ACETONIDE 0.1 % EX CREA: 1.0000 "application " | TOPICAL_CREAM | Freq: Two times a day (BID) | CUTANEOUS | 0 refills | Status: AC

## 2017-08-02 NOTE — Progress Notes (Signed)
Subjective:    Patient ID: Gina Jefferson, female    DOB: [birth year], 66 y.o.   MRN: [id number]  HPI Here with 2 days onset erythem left forehead only rash with mild to mod burning discomfort, constant, but not overly tender to palpate, without radiation, other HA, vision change or facial weakness.  Pt denies chest pain, increased sob or doe, wheezing, orthopnea, PND, increased LE swelling, palpitations, dizziness or syncope.  Pt denies polydipsia, polyuria,  Past Medical History:  Diagnosis Date  . Anxiety   . Arthritis    knee, c-spine  . Carpal tunnel syndrome of right wrist 10/2011   s/p surgical release  . Colon polyp   . GERD   . Hx of adenomatous polyp of colon 2010  . Hyperlipidemia   . HYPERTENSION    under control; has been on med. x 15 yrs.  . Seasonal allergies    Past Surgical History:  Procedure Laterality Date  . ABDOMINAL HYSTERECTOMY     complete  . APPENDECTOMY    . BREAST REDUCTION SURGERY  2016  . CARPAL TUNNEL RELEASE  11/24/2011   Procedure: CARPAL TUNNEL RELEASE;  Surgeon: Garald Balding, MD;  Location: [facility];  Service: Orthopedics;  Laterality: Right;  CARPAL TUNNEL REL ON RIGHT   . CESAREAN SECTION    . CHOLECYSTECTOMY    . COLONOSCOPY    . OOPHORECTOMY    . OVARIAN CYST REMOVAL    . POLYPECTOMY    . TONSILLECTOMY    . WISDOM TOOTH EXTRACTION      reports that she quit smoking about 13 years ago. Her smoking use included Cigarettes. She has never used smokeless tobacco. She reports that she does not drink alcohol or use drugs. family history includes Alzheimer's disease in her father; Cancer in her maternal grandfather; Diabetes in her mother; Heart disease in her mother; Hyperlipidemia in her father and mother; Hypertension in her father and mother; Mental illness in her father; Stroke in her father and mother. Allergies  Allergen Reactions  . Clindamycin/Lincomycin Rash  . Ciprofloxacin Rash  . Penicillins Rash  . Statins  Other (See Comments)    Muscle aches  . Metformin Diarrhea and Other (See Comments)    Vomiting    Current Outpatient Prescriptions on File Prior to Visit  Medication Sig Dispense Refill  . amLODipine-valsartan (EXFORGE) 5-160 MG tablet Take 1 tablet by mouth daily. 90 tablet 2  . Ascorbic Acid (VITAMIN C) 500 MG CAPS Take 500 mg by mouth daily.    . benzonatate (TESSALON) 200 MG capsule Take 1 capsule (200 mg total) by mouth 3 (three) times daily as needed for cough. 90 capsule 1  . beta carotene w/minerals (OCUVITE) tablet Take 1 tablet by mouth daily. Reported on 02/18/2016    . Biotin 1000 MCG tablet Take 1,000 mcg by mouth daily.      . cetirizine (ZYRTEC) 10 MG tablet Take 10 mg by mouth daily.      . diclofenac (VOLTAREN) 75 MG EC tablet Take 75 mg by mouth 1 day or 1 dose.    . Diclofenac Sodium 3 % GEL Reported on 05/05/2016  98  . fluticasone (FLONASE) 50 MCG/ACT nasal spray Place 2 sprays into both nostrils daily. 16 g 6  . Garlic 1601 MG TBEC Take by mouth daily.    Mar[place] Kitchen glucose blood (FREESTYLE TEST STRIPS) test strip Use as instructed 30 each 5  . meclizine (ANTIVERT) 12.5 MG tablet Take 1 tablet (  12.5 mg total) by mouth 3 (three) times daily as needed for dizziness. 30 tablet 0  . ondansetron (ZOFRAN) 8 MG tablet Take 1 tablet (8 mg total) by mouth every 8 (eight) hours as needed for nausea or vomiting. 60 tablet 0  . Zinc 30 MG TABS Take by mouth 3 (three) times daily.     No current facility-administered medications on file prior to visit.    Review of Systems  Constitutional: Negative for other unusual diaphoresis or sweats HENT: Negative for ear discharge or swelling Eyes: Negative for other worsening visual disturbances Respiratory: Negative for stridor or other swelling  Gastrointestinal: Negative for worsening distension or other blood Genitourinary: Negative for retention or other urinary change Musculoskeletal: Negative for other MSK pain or swelling Skin: Negative  for color change or other new lesions Neurological: Negative for worsening tremors and other numbness  Psychiatric/Behavioral: Negative for worsening agitation or other fatigue All other system neg per pt    Objective:   Physical Exam BP 124/86   Pulse 85   Temp 98.1 F (36.7 C) (Oral)   Ht 5\' 1"  (1.549 m)   Wt 158 lb (71.7 kg)   SpO2 96%   BMI 29.85 kg/m  VS noted,  Constitutional: Pt appears in NAD HENT: Head: NCAT.  Right Ear: External ear normal.  Left Ear: External ear normal.  Eyes: . Pupils are equal, round, and reactive to light. Conjunctivae and EOM are normal Nose: without d/c or deformity Neck: Neck supple. Gross normal ROM Cardiovascular: Normal rate and regular rhythm.   Pulmonary/Chest: Effort normal and breath sounds without rales or wheezing.  Neurological: Pt is alert. At baseline orientation, motor grossly intact Skin: Skin is warm. + left forehead 2 x 1 cm area slighlyt raised slightly teder rashes of grouped vesicles on erythem base, but no other new lesions, no LE edema Psychiatric: Pt behavior is normal without agitation  No other exam findings     Assessment & Plan:

## 2017-08-02 NOTE — Assessment & Plan Note (Signed)
Lab Results  Component Value Date   HGBA1C 6.0 02/21/2017  stable overall by history and exam, recent data reviewed with pt, and pt to continue medical treatment as before,  to f/u any worsening symptoms or concerns

## 2017-08-02 NOTE — Assessment & Plan Note (Signed)
Mild to mod, for antibx course,  to f/u any worsening symptoms or concerns 

## 2017-08-02 NOTE — Assessment & Plan Note (Signed)
stable overall by history and exam, recent data reviewed with pt, and pt to continue medical treatment as before,  to f/u any worsening symptoms or concerns BP Readings from Last 3 Encounters:  08/02/17 124/86  02/21/17 (!) 138/98  02/13/17 124/72

## 2017-08-02 NOTE — Patient Instructions (Signed)
Please take all new medication as prescribed - the antibiotic (valtrex) and steroid cream  Please continue all other medications as before, and refills have been done if requested.  Please have the pharmacy call with any other refills you may need.  Please keep your appointments with your specialists as you may have planned

## 2017-08-03 MED FILL — TRIAMCINOLONE 0.1% CREAM: 0.1 | 10 days supply | Qty: 30 | Fill #0

## 2017-08-03 MED FILL — valACYclovir HCL 1 GM TABS: 1 | 7 days supply | Qty: 21 | Fill #0

## 2017-09-26 DIAGNOSIS — Z6829 Body mass index (BMI) 29.0-29.9, adult: Secondary | ICD-10-CM | POA: Diagnosis not present

## 2017-09-26 DIAGNOSIS — Z01419 Encounter for gynecological examination (general) (routine) without abnormal findings: Secondary | ICD-10-CM | POA: Diagnosis not present

## 2017-09-26 DIAGNOSIS — Z1231 Encounter for screening mammogram for malignant neoplasm of breast: Secondary | ICD-10-CM | POA: Diagnosis not present

## 2017-09-27 MED FILL — BENZONATATE 200 MG CAPSULE: 200 | 30 days supply | Qty: 90 | Fill #1

## 2017-09-27 MED FILL — DICLOFENAC SOD 75 MG TAB EC: 75 | 30 days supply | Qty: 60 | Fill #2

## 2017-09-27 MED FILL — AMLODIPINE-VALSARTAN 5-160: 5-160 | 90 days supply | Qty: 90 | Fill #2

## 2017-09-28 LAB — HM MAMMOGRAPHY

## 2017-11-16 IMAGING — MG 2D DIGITAL DIAGNOSTIC UNILATERAL LEFT MAMMOGRAM WITH CAD AND ADJ
6 series · 6 of 14 positions shown · non-contrast
Comparison: 08/10/2016 and prior mammograms.

CLINICAL DATA: 65-year-old female for further evaluation of
possible left breast mass and calcifications. Patient with bilateral
breast reductions in 9321.

EXAM:
2D DIGITAL DIAGNOSTIC UNILATERAL LEFT MAMMOGRAM WITH CAD AND ADJUNCT
TOMO

[L CC synth-2D]
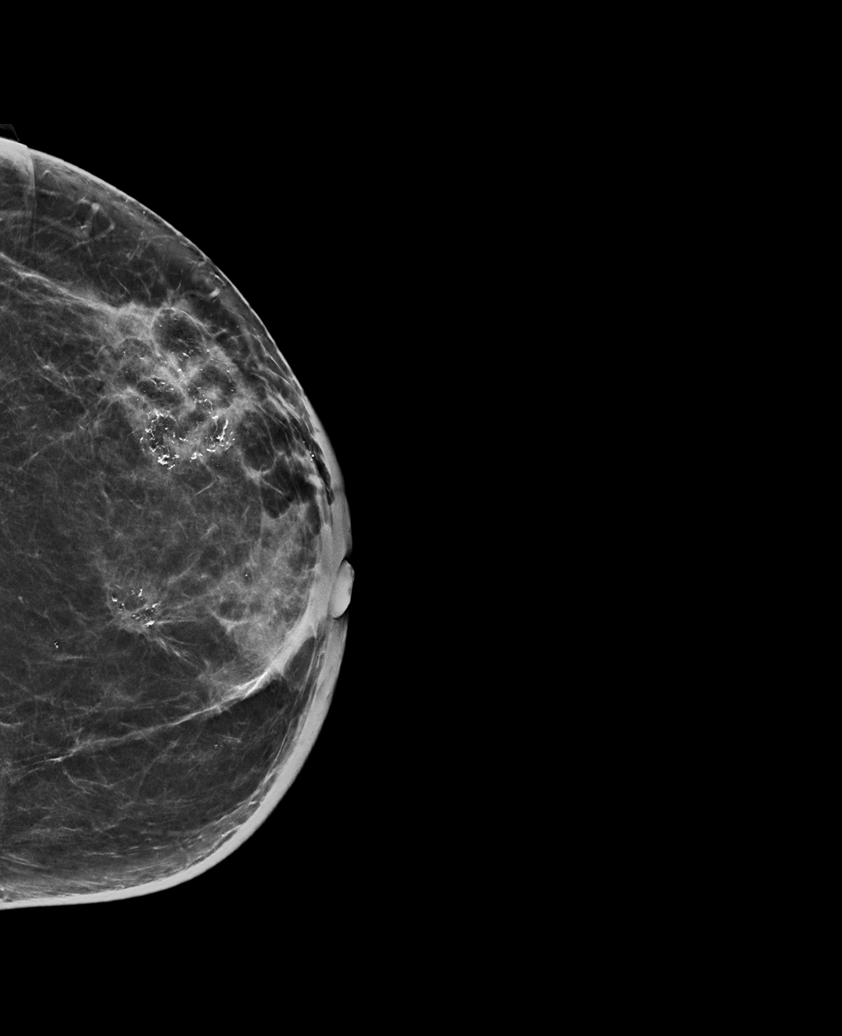

[L MLO]
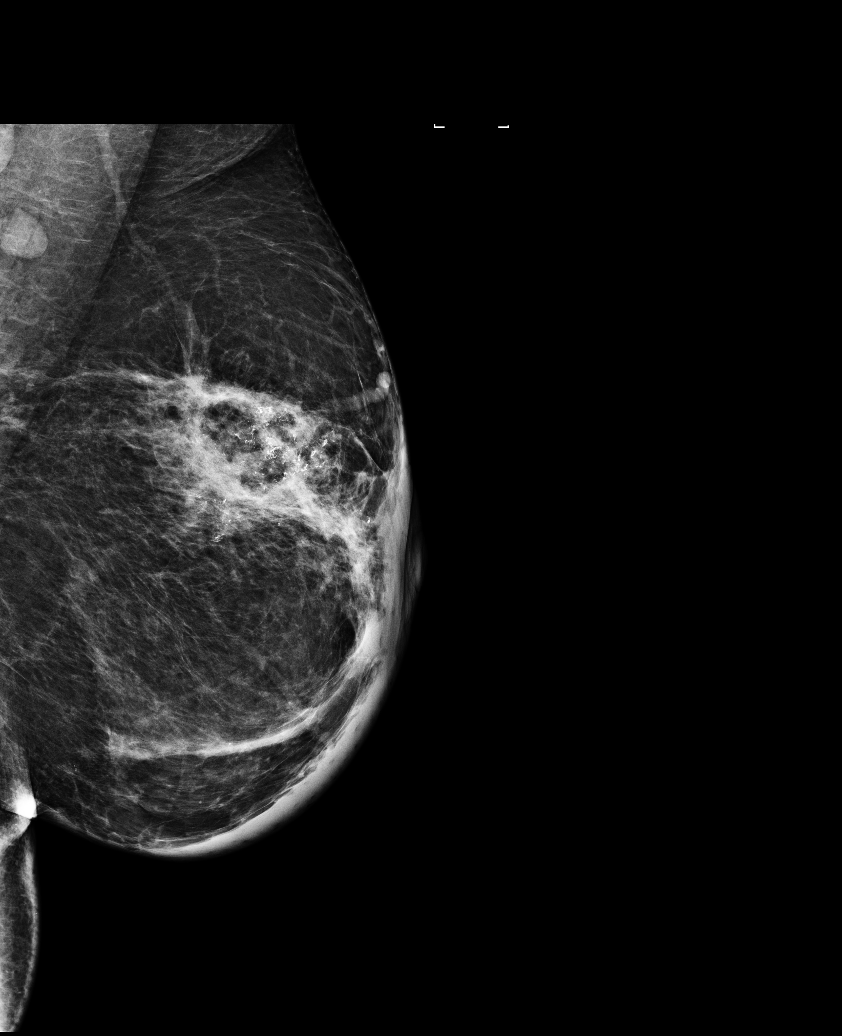

[L MLO synth-2D]
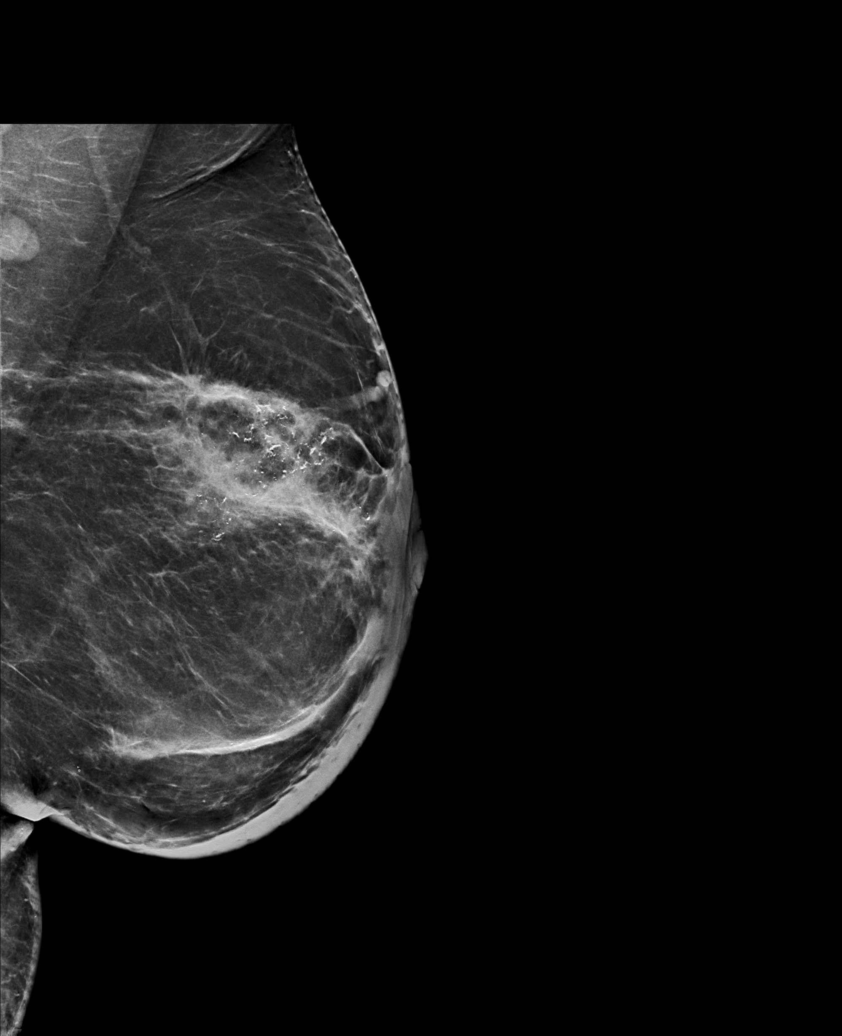

[L CC]
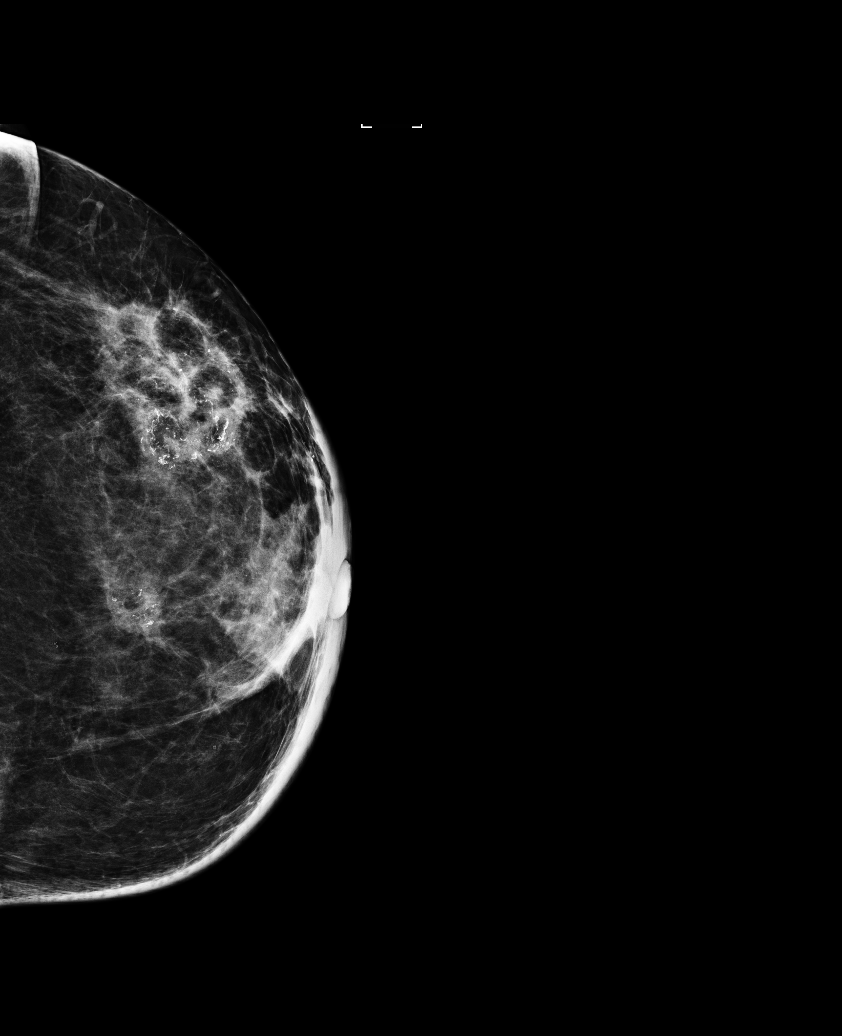

[L MLO tomo · tomo slice 39/77.0]
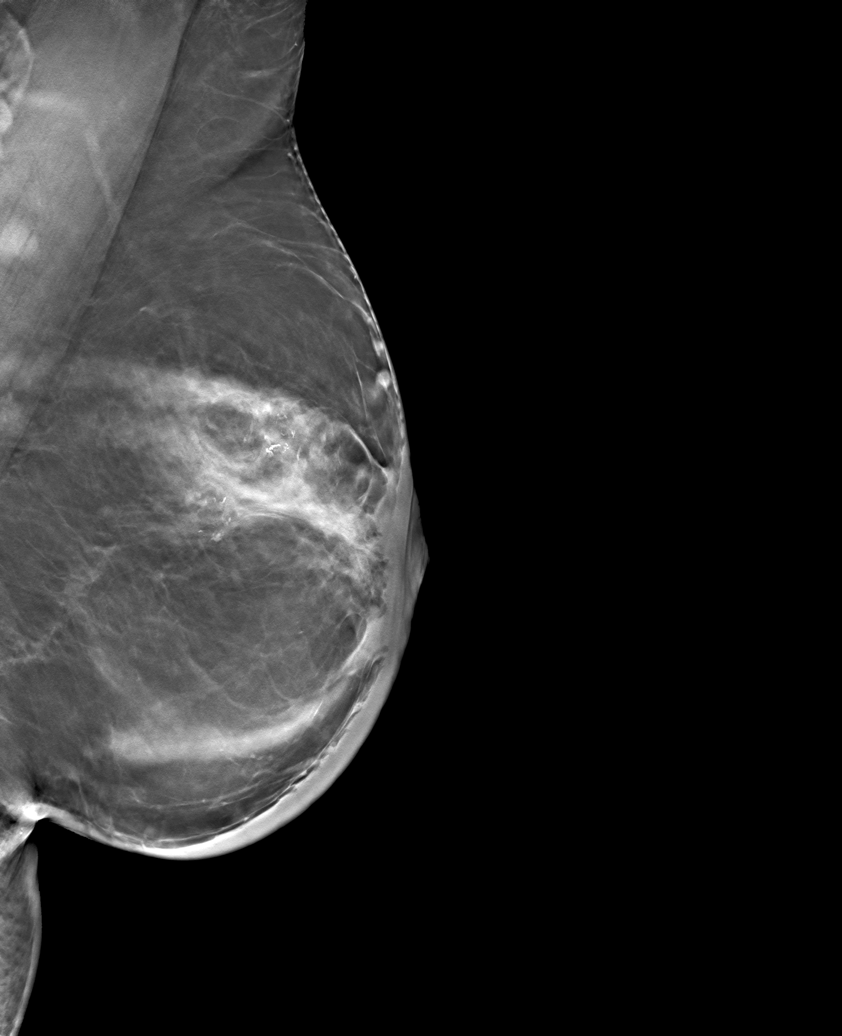

[L CC tomo · tomo slice 37/73.0]
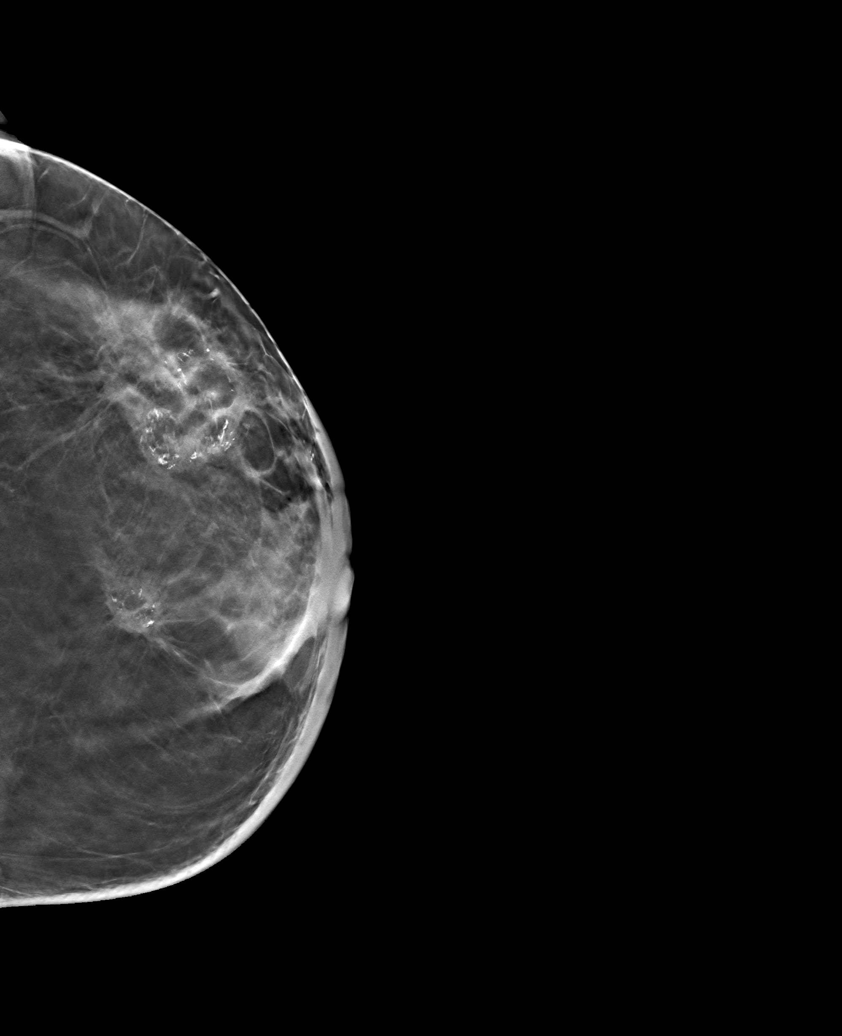

[6 of 14 positions shown; findings below may reference images not displayed]

ACR Breast Density Category b: There are scattered areas of
fibroglandular density.
FINDINGS: 2D and 3D full field views of the left breast demonstrate fat
necrosis changes with dystrophic calcifications within the upper and
outer left breast.

No suspicious mass, worrisome calcifications or nonsurgical
distortion identified.

Mammographic images were processed with CAD.

On physical examination, surgical scarring overlying the left breast
identified.
IMPRESSION: Fat necrosis changes with dystrophic calcifications within the upper
and outer left breast, compatible with prior reduction mammoplasty.

RECOMMENDATION:
Bilateral screening mammograms in 1 year.

I have discussed the findings and recommendations with the patient.
Results were also provided in writing at the conclusion of the
visit. If applicable, a reminder letter will be sent to the patient
regarding the next appointment.

BI-RADS CATEGORY  2: Benign.

## 2017-11-23 MED FILL — DICLOFENAC SODIUM 75 MG TAB: 75 | 30 days supply | Qty: 60 | Fill #3

## 2017-12-18 DIAGNOSIS — H01009 Unspecified blepharitis unspecified eye, unspecified eyelid: Secondary | ICD-10-CM | POA: Diagnosis not present

## 2017-12-18 DIAGNOSIS — H1852 Epithelial (juvenile) corneal dystrophy: Secondary | ICD-10-CM | POA: Diagnosis not present

## 2017-12-18 DIAGNOSIS — H40013 Open angle with borderline findings, low risk, bilateral: Secondary | ICD-10-CM | POA: Diagnosis not present

## 2017-12-18 DIAGNOSIS — H0014 Chalazion left upper eyelid: Secondary | ICD-10-CM | POA: Diagnosis not present

## 2018-01-05 ENCOUNTER — Other Ambulatory Visit: Payer: Self-pay | Admitting: Internal Medicine

## 2018-01-05 MED FILL — AMLODIPINE-VALSARTAN 5-160: 5-160 | 30 days supply | Qty: 30 | Fill #0

## 2018-01-08 MED FILL — DICLOFENAC SODIUM 75 MG TAB: 75 | 30 days supply | Qty: 60 | Fill #4

## 2018-02-23 ENCOUNTER — Encounter: Payer: Self-pay | Admitting: Internal Medicine

## 2018-02-23 ENCOUNTER — Ambulatory Visit (INDEPENDENT_AMBULATORY_CARE_PROVIDER_SITE_OTHER): Payer: Medicare Other | Admitting: *Deleted

## 2018-02-23 ENCOUNTER — Other Ambulatory Visit (INDEPENDENT_AMBULATORY_CARE_PROVIDER_SITE_OTHER): Payer: Medicare Other

## 2018-02-23 ENCOUNTER — Ambulatory Visit (INDEPENDENT_AMBULATORY_CARE_PROVIDER_SITE_OTHER): Payer: Medicare Other | Admitting: Internal Medicine

## 2018-02-23 VITALS — BP 140/90 | HR 94 | Temp 98.6°F | Ht 61.0 in | Wt 159.0 lb

## 2018-02-23 VITALS — BP 140/90 | HR 83 | Temp 98.6°F | Ht 61.0 in | Wt 159.0 lb

## 2018-02-23 DIAGNOSIS — Z23 Encounter for immunization: Secondary | ICD-10-CM

## 2018-02-23 DIAGNOSIS — Z0000 Encounter for general adult medical examination without abnormal findings: Secondary | ICD-10-CM | POA: Diagnosis not present

## 2018-02-23 DIAGNOSIS — E785 Hyperlipidemia, unspecified: Secondary | ICD-10-CM

## 2018-02-23 DIAGNOSIS — R7301 Impaired fasting glucose: Secondary | ICD-10-CM

## 2018-02-23 DIAGNOSIS — I10 Essential (primary) hypertension: Secondary | ICD-10-CM

## 2018-02-23 LAB — COMPREHENSIVE METABOLIC PANEL
ALK PHOS: 75 U/L (ref 39–117)
ALT: 22 U/L (ref 0–35)
AST: 23 U/L (ref 0–37)
Albumin: 4 g/dL (ref 3.5–5.2)
BILIRUBIN TOTAL: 0.9 mg/dL (ref 0.2–1.2)
BUN: 12 mg/dL (ref 6–23)
CO2: 31 mEq/L (ref 19–32)
Calcium: 9.5 mg/dL (ref 8.4–10.5)
Chloride: 104 mEq/L (ref 96–112)
Creatinine, Ser: 0.68 mg/dL (ref 0.40–1.20)
GFR: 111.28 mL/min (ref >60.00)
GLUCOSE: 98 mg/dL (ref 70–99)
Potassium: 3.7 mEq/L (ref 3.5–5.1)
SODIUM: 141 meq/L (ref 135–145)
TOTAL PROTEIN: 7.7 g/dL (ref 6.0–8.3)

## 2018-02-23 LAB — LIPID PANEL
CHOL/HDL RATIO: 6
Cholesterol: 297 mg/dL — ABNORMAL HIGH (ref 0–200)
HDL: 53 mg/dL (ref >39.00)
LDL Cholesterol: 212 mg/dL — ABNORMAL HIGH (ref 0–99)
NONHDL: 244.15
Triglycerides: 162 mg/dL — ABNORMAL HIGH (ref 0.0–149.0)
VLDL: 32.4 mg/dL (ref 0.0–40.0)

## 2018-02-23 LAB — CBC
HCT: 41.6 % (ref 36.0–46.0)
Hemoglobin: 13.9 g/dL (ref 12.0–15.0)
MCHC: 33.4 g/dL (ref 30.0–36.0)
MCV: 95.2 fl (ref 78.0–100.0)
Platelets: 226 10*3/uL (ref 150.0–400.0)
RBC: 4.37 Mil/uL (ref 3.87–5.11)
RDW: 13.7 % (ref 11.5–15.5)
WBC: 6.3 10*3/uL (ref 4.0–10.5)

## 2018-02-23 MED ORDER — AMLODIPINE BESYLATE 5 MG PO TABS: 5.0000 mg | ORAL_TABLET | Freq: Every day | ORAL | 3 refills | Status: DC

## 2018-02-23 MED ORDER — VALSARTAN 160 MG PO TABS: 160.0000 mg | ORAL_TABLET | Freq: Every day | ORAL | 3 refills | Status: DC

## 2018-02-23 NOTE — Progress Notes (Signed)
   Subjective:    Patient ID: Gina Jefferson, female    DOB: [birth year], 67 y.o.   MRN: [id number]  HPI The patient is a 67 YO female coming in for physical. No new concerns.  PMH, [facility], social history reviewed and updated.   Review of Systems  Constitutional: Negative.   HENT: Negative.   Eyes: Negative.   Respiratory: Negative for cough, chest tightness and shortness of breath.   Cardiovascular: Negative for chest pain, palpitations and leg swelling.  Gastrointestinal: Negative for abdominal distention, abdominal pain, constipation, diarrhea, nausea and vomiting.  Musculoskeletal: Negative.   Skin: Negative.   Neurological: Negative.   Psychiatric/Behavioral: Negative.       Objective:   Physical Exam  Constitutional: She is oriented to person, place, and time. She appears well-developed and well-nourished.  HENT:  Head: Normocephalic and atraumatic.  Eyes: EOM are normal.  Neck: Normal range of motion.  Cardiovascular: Normal rate and regular rhythm.  Carotids without bruit  Pulmonary/Chest: Effort normal and breath sounds normal. No respiratory distress. She has no wheezes. She has no rales.  Abdominal: Soft. Bowel sounds are normal. She exhibits no distension. There is no tenderness. There is no rebound.  Musculoskeletal: She exhibits no edema.  Neurological: She is alert and oriented to person, place, and time. Coordination normal.  Skin: Skin is warm and dry.  Psychiatric: She has a normal mood and affect.   Vitals:   02/23/18 0855  BP: 140/90  Pulse: 83  Temp: 98.6 F (37 C)  TempSrc: Oral  SpO2: 94%  Weight: 159 lb (72.1 kg)  Height: 5\' 1"  (1.549 m)   EKG: Rate 73, axis normal, intervals normal, sinus, no st or t wave changes, no change when compared to prior 2012    Assessment & Plan:  Pneumonia 23 given at visit

## 2018-02-23 NOTE — Assessment & Plan Note (Signed)
Given pneumonia 23 today, shingrix deferred due to infection within the last year. Tetanus and flu up to date. Colonoscopy up to date. Mammogram up to date and release signed for records. Aged out of pap smear. Counseled about sun safety and mole surveillance. Given 10 year screening recommendations.

## 2018-02-23 NOTE — Patient Instructions (Addendum)
Continue doing brain stimulating activities (puzzles, reading, adult coloring books, staying active) to keep memory sharp.   Continue to eat heart healthy diet (full of fruits, vegetables, whole grains, lean protein, water--limit salt, fat, and sugar intake) and increase physical activity as tolerated.   Gina Jefferson , Thank you for taking time to come for your Medicare Wellness Visit. I appreciate your ongoing commitment to your health goals. Please review the following plan we discussed and let me know if I can assist you in the future.   These are the goals we discussed: Goals    . Patient Stated     Maintain current health status. Continue to enjoy cooking, gardening, help to take care great grandchildren.       This is a list of the screening recommended for you and due dates:  Health Maintenance  Topic Date Due  . Mammogram  10/20/2016  . Tetanus Vaccine  02/26/2025  . Colon Cancer Screening  07/19/2025  . Flu Shot  Completed  . DEXA scan (bone density measurement)  Completed  .  Hepatitis C: One time screening is recommended by Center for Disease Control  (CDC) for  adults born from [birth year] through 1964.   Completed  . Pneumonia vaccines  Completed

## 2018-02-23 NOTE — Assessment & Plan Note (Signed)
Checking HgA1c and adjust as needed.  

## 2018-02-23 NOTE — Assessment & Plan Note (Signed)
EKG done and stable from prior 2012, checking CMP and adjust as needed. Due to cost we will split amlodipine and valsartan to 2 separate pills from [facility].

## 2018-02-23 NOTE — Patient Instructions (Signed)
Try melatonin for sleep. We have given you the pneumonia shot today.  We should wait until at least September to consider giving you the new shingles vaccine.   Health Maintenance, Female Adopting a healthy lifestyle and getting preventive care can go a long way to promote health and wellness. Talk with your health care provider about what schedule of regular examinations is right for you. This is a good chance for you to check in with your provider about disease prevention and staying healthy. In between checkups, there are plenty of things you can do on your own. Experts have done a lot of research about which lifestyle changes and preventive measures are most likely to keep you healthy. Ask your health care provider for more information. Weight and diet Eat a healthy diet  Be sure to include plenty of vegetables, fruits, low-fat dairy products, and lean protein.  Do not eat a lot of foods high in solid fats, added sugars, or salt.  Get regular exercise. This is one of the most important things you can do for your health. ? Most adults should exercise for at least 150 minutes each week. The exercise should increase your heart rate and make you sweat (moderate-intensity exercise). ? Most adults should also do strengthening exercises at least twice a week. This is in addition to the moderate-intensity exercise.  Maintain a healthy weight  Body mass index (BMI) is a measurement that can be used to identify possible weight problems. It estimates body fat based on height and weight. Your health care provider can help determine your BMI and help you achieve or maintain a healthy weight.  For females 61 years of age and older: ? A BMI below 18.5 is considered underweight. ? A BMI of 18.5 to 24.9 is normal. ? A BMI of 25 to 29.9 is considered overweight. ? A BMI of 30 and above is considered obese.  Watch levels of cholesterol and blood lipids  You should start having your blood tested for  lipids and cholesterol at 66 years of age, then have this test every 5 years.  You may need to have your cholesterol levels checked more often if: ? Your lipid or cholesterol levels are high. ? You are older than 66 years of age. ? You are at high risk for heart disease.  Cancer screening Lung Cancer  Lung cancer screening is recommended for adults 27-2 years old who are at high risk for lung cancer because of a history of smoking.  A yearly low-dose CT scan of the lungs is recommended for people who: ? Currently smoke. ? Have quit within the past 15 years. ? Have at least a 30-pack-year history of smoking. A pack year is smoking an average of one pack of cigarettes a day for 1 year.  Yearly screening should continue until it has been 15 years since you quit.  Yearly screening should stop if you develop a health problem that would prevent you from having lung cancer treatment.  Breast Cancer  Practice breast self-awareness. This means understanding how your breasts normally appear and feel.  It also means doing regular breast self-exams. Let your health care provider know about any changes, no matter how small.  If you are in your 20s or 30s, you should have a clinical breast exam (CBE) by a health care provider every 1-3 years as part of a regular health exam.  If you are 25 or older, have a CBE every year. Also consider having a breast  X-ray (mammogram) every year.  If you have a family history of breast cancer, talk to your health care provider about genetic screening.  If you are at high risk for breast cancer, talk to your health care provider about having an MRI and a mammogram every year.  Breast cancer gene (BRCA) assessment is recommended for women who have family members with BRCA-related cancers. BRCA-related cancers include: ? Breast. ? Ovarian. ? Tubal. ? Peritoneal cancers.  Results of the assessment will determine the need for genetic counseling and BRCA1 and  BRCA2 testing.  Cervical Cancer Your health care provider may recommend that you be screened regularly for cancer of the pelvic organs (ovaries, uterus, and vagina). This screening involves a pelvic examination, including checking for microscopic changes to the surface of your cervix (Pap test). You may be encouraged to have this screening done every 3 years, beginning at age 84.  For women ages 24-65, health care providers may recommend pelvic exams and Pap testing every 3 years, or they may recommend the Pap and pelvic exam, combined with testing for human papilloma virus (HPV), every 5 years. Some types of HPV increase your risk of cervical cancer. Testing for HPV may also be done on women of any age with unclear Pap test results.  Other health care providers may not recommend any screening for nonpregnant women who are considered low risk for pelvic cancer and who do not have symptoms. Ask your health care provider if a screening pelvic exam is right for you.  If you have had past treatment for cervical cancer or a condition that could lead to cancer, you need Pap tests and screening for cancer for at least 20 years after your treatment. If Pap tests have been discontinued, your risk factors (such as having a new sexual partner) need to be reassessed to determine if screening should resume. Some women have medical problems that increase the chance of getting cervical cancer. In these cases, your health care provider may recommend more frequent screening and Pap tests.  Colorectal Cancer  This type of cancer can be detected and often prevented.  Routine colorectal cancer screening usually begins at 66 years of age and continues through 66 years of age.  Your health care provider may recommend screening at an earlier age if you have risk factors for colon cancer.  Your health care provider may also recommend using home test kits to check for hidden blood in the stool.  A small camera at the  end of a tube can be used to examine your colon directly (sigmoidoscopy or colonoscopy). This is done to check for the earliest forms of colorectal cancer.  Routine screening usually begins at age 33.  Direct examination of the colon should be repeated every 5-10 years through 66 years of age. However, you may need to be screened more often if early forms of precancerous polyps or small growths are found.  Skin Cancer  Check your skin from head to toe regularly.  Tell your health care provider about any new moles or changes in moles, especially if there is a change in a mole's shape or color.  Also tell your health care provider if you have a mole that is larger than the size of a pencil eraser.  Always use sunscreen. Apply sunscreen liberally and repeatedly throughout the day.  Protect yourself by wearing long sleeves, pants, a wide-brimmed hat, and sunglasses whenever you are outside.  Heart disease, diabetes, and high blood pressure  High blood  pressure causes heart disease and increases the risk of stroke. High blood pressure is more likely to develop in: ? People who have blood pressure in the high end of the normal range (130-139/85-89 mm Hg). ? People who are overweight or obese. ? People who are African American.  If you are 67-37 years of age, have your blood pressure checked every 3-5 years. If you are 29 years of age or older, have your blood pressure checked every year. You should have your blood pressure measured twice-once when you are at a hospital or clinic, and once when you are not at a hospital or clinic. Record the average of the two measurements. To check your blood pressure when you are not at a hospital or clinic, you can use: ? An automated blood pressure machine at a pharmacy. ? A home blood pressure monitor.  If you are between 6 years and 14 years old, ask your health care provider if you should take aspirin to prevent strokes.  Have regular diabetes  screenings. This involves taking a blood sample to check your fasting blood sugar level. ? If you are at a normal weight and have a low risk for diabetes, have this test once every three years after 66 years of age. ? If you are overweight and have a high risk for diabetes, consider being tested at a younger age or more often. Preventing infection Hepatitis B  If you have a higher risk for hepatitis B, you should be screened for this virus. You are considered at high risk for hepatitis B if: ? You were born in a country where hepatitis B is common. Ask your health care provider which countries are considered high risk. ? Your parents were born in a high-risk country, and you have not been immunized against hepatitis B (hepatitis B vaccine). ? You have HIV or AIDS. ? You use needles to inject street drugs. ? You live with someone who has hepatitis B. ? You have had sex with someone who has hepatitis B. ? You get hemodialysis treatment. ? You take certain medicines for conditions, including cancer, organ transplantation, and autoimmune conditions.  Hepatitis C  Blood testing is recommended for: ? Everyone born from [birth year] through 1964. ? Anyone with known risk factors for hepatitis C.  Sexually transmitted infections (STIs)  You should be screened for sexually transmitted infections (STIs) including gonorrhea and chlamydia if: ? You are sexually active and are younger than 66 years of age. ? You are older than 66 years of age and your health care provider tells you that you are at risk for this type of infection. ? Your sexual activity has changed since you were last screened and you are at an increased risk for chlamydia or gonorrhea. Ask your health care provider if you are at risk.  If you do not have HIV, but are at risk, it may be recommended that you take a prescription medicine daily to prevent HIV infection. This is called pre-exposure prophylaxis (PrEP). You are considered at risk  if: ? You are sexually active and do not regularly use condoms or know the HIV status of your partner(s). ? You take drugs by injection. ? You are sexually active with a partner who has HIV.  Talk with your health care provider about whether you are at high risk of being infected with HIV. If you choose to begin PrEP, you should first be tested for HIV. You should then be tested every 3 months for as  long as you are taking PrEP. Pregnancy  If you are premenopausal and you may become pregnant, ask your health care provider about preconception counseling.  If you may become pregnant, take 400 to 800 micrograms (mcg) of folic acid every day.  If you want to prevent pregnancy, talk to your health care provider about birth control (contraception). Osteoporosis and menopause  Osteoporosis is a disease in which the bones lose minerals and strength with aging. This can result in serious bone fractures. Your risk for osteoporosis can be identified using a bone density scan.  If you are 94 years of age or older, or if you are at risk for osteoporosis and fractures, ask your health care provider if you should be screened.  Ask your health care provider whether you should take a calcium or vitamin D supplement to lower your risk for osteoporosis.  Menopause may have certain physical symptoms and risks.  Hormone replacement therapy may reduce some of these symptoms and risks. Talk to your health care provider about whether hormone replacement therapy is right for you. Follow these instructions at home:  Schedule regular health, dental, and eye exams.  Stay current with your immunizations.  Do not use any tobacco products including cigarettes, chewing tobacco, or electronic cigarettes.  If you are pregnant, do not drink alcohol.  If you are breastfeeding, limit how much and how often you drink alcohol.  Limit alcohol intake to no more than 1 drink per day for nonpregnant women. One drink  equals 12 ounces of beer, 5 ounces of wine, or 1 ounces of hard liquor.  Do not use street drugs.  Do not share needles.  Ask your health care provider for help if you need support or information about quitting drugs.  Tell your health care provider if you often feel depressed.  Tell your health care provider if you have ever been abused or do not feel safe at home. This information is not intended to replace advice given to you by your health care provider. Make sure you discuss any questions you have with your health care provider. Document Released: 05/30/2011 Document Revised: 04/21/2016 Document Reviewed: 08/18/2015 Elsevier Interactive Patient Education  [employer].

## 2018-02-23 NOTE — Assessment & Plan Note (Signed)
Checking lipid panel and adjust as needed.  

## 2018-02-23 NOTE — Progress Notes (Signed)
Subjective:   Gina Jefferson is a 67 y.o. female who presents for an Initial Medicare Annual Wellness Visit.  Review of Systems    No ROS.  Medicare Wellness Visit. Additional risk factors are reflected in the social history.   Cardiac Risk Factors include: advanced age (>86men, >68 women);dyslipidemia;hypertension Sleep patterns: gets up 1 times nightly to void and sleeps 5-6 hours nightly.    Home Safety/Smoke Alarms: Feels safe in home. Smoke alarms in place.  Living environment; residence and Firearm Safety: 2-story house, no firearms. Lives with husband, no needs for DME, good support system Seat Belt Safety/Bike Helmet: Wears seat belt.    Objective:    Today's Vitals   02/23/18 1020  BP: 140/90  Pulse: 94  Temp: 98.6 F (37 C)  SpO2: 94%  Weight: 159 lb (72.1 kg)  Height: 5\' 1"  (1.549 m)   Body mass index is 30.04 kg/m.  Advanced Directives 02/23/2018 11/17/2011  Does Patient Have a Medical Advance Directive? Yes Patient has advance directive, copy not in chart  Type of Advance Directive [place];Living will Living will;Healthcare Power of [place] in Chart? No - copy requested -    Current Medications (verified) Outpatient Encounter Medications as of 02/23/2018  Medication Sig  . amLODipine (NORVASC) 5 MG tablet Take 1 tablet (5 mg total) by mouth daily.  . Ascorbic Acid (VITAMIN C) 500 MG CAPS Take 500 mg by mouth daily.  . benzonatate (TESSALON) 200 MG capsule Take 1 capsule (200 mg total) by mouth 3 (three) times daily as needed for cough.  . Biotin 1000 MCG tablet Take 1,000 mcg by mouth daily.    . cetirizine (ZYRTEC) 10 MG tablet Take 10 mg by mouth daily.    . diclofenac (VOLTAREN) 75 MG EC tablet Take 75 mg by mouth 1 day or 1 dose.  . Diclofenac Sodium 3 % GEL Reported on 05/05/2016  . fluticasone (FLONASE) 50 MCG/ACT nasal spray Place 2 sprays into both nostrils daily.  . Garlic 9323 MG TBEC  Take by mouth daily.  Marland Kitchen glucose blood (FREESTYLE TEST STRIPS) test strip Use as instructed  . meclizine (ANTIVERT) 12.5 MG tablet Take 1 tablet (12.5 mg total) by mouth 3 (three) times daily as needed for dizziness.  . ondansetron (ZOFRAN) 8 MG tablet Take 1 tablet (8 mg total) by mouth every 8 (eight) hours as needed for nausea or vomiting.  . triamcinolone cream (KENALOG) 0.1 % Apply 1 application topically 2 (two) times daily.  . valsartan (DIOVAN) 160 MG tablet Take 1 tablet (160 mg total) by mouth daily.  . [DISCONTINUED] amLODipine-valsartan (EXFORGE) 5-160 MG tablet TAKE 1 TABLET BY MOUTH DAILY.  . [DISCONTINUED] beta carotene w/minerals (OCUVITE) tablet Take 1 tablet by mouth daily. Reported on 02/18/2016  . [DISCONTINUED] valACYclovir (VALTREX) 1000 MG tablet Take 1 tablet (1,000 mg total) by mouth 3 (three) times daily. (Patient not taking: Reported on 02/23/2018)  . [DISCONTINUED] Zinc 30 MG TABS Take by mouth 3 (three) times daily.   No facility-administered encounter medications on file as of 02/23/2018.     Allergies (verified) Clindamycin/lincomycin; Ciprofloxacin; Penicillins; Statins; and Metformin   History: Past Medical History:  Diagnosis Date  . Anxiety   . Arthritis    knee, c-spine  . Carpal tunnel syndrome of right wrist 10/2011   s/p surgical release  . Colon polyp   . GERD   . Hx of adenomatous polyp of colon 2010  .  Hyperlipidemia   . HYPERTENSION    under control; has been on med. x 15 yrs.  . Seasonal allergies    Past Surgical History:  Procedure Laterality Date  . ABDOMINAL HYSTERECTOMY     complete  . APPENDECTOMY    . BREAST REDUCTION SURGERY  2016  . CARPAL TUNNEL RELEASE  11/24/2011   Procedure: CARPAL TUNNEL RELEASE;  Surgeon: Garald Balding, MD;  Location: [place];  Service: Orthopedics;  Laterality: Right;  CARPAL TUNNEL REL ON RIGHT   . CATARACT EXTRACTION, BILATERAL Bilateral 2018  . CESAREAN SECTION    .  CHOLECYSTECTOMY    . COLONOSCOPY    . OOPHORECTOMY    . OVARIAN CYST REMOVAL    . POLYPECTOMY    . TONSILLECTOMY    . WISDOM TOOTH EXTRACTION     Family History  Problem Relation Age of Onset  . Diabetes Mother   . Stroke Mother   . Heart disease Mother   . Hyperlipidemia Mother   . Hypertension Mother   . Stroke Father   . Alzheimer's disease Father   . Mental illness Father   . Hypertension Father   . Hyperlipidemia Father   . Cancer Maternal Grandfather        stomach cancer  . Colon cancer Neg Hx    Social History   Socioeconomic History  . Marital status: Married    Spouse name: Not on file  . Number of children: Not on file  . Years of education: Not on file  . Highest education level: Not on file  Occupational History  . Not on file  Social Needs  . Financial resource strain: Not hard at all  . Food insecurity:    Worry: Never true    Inability: Never true  . Transportation needs:    Medical: No    Non-medical: No  Tobacco Use  . Smoking status: Former Smoker    Types: Cigarettes    Last attempt to quit: 11/29/2003    Years since quitting: 14.2  . Smokeless tobacco: Never Used  Substance and Sexual Activity  . Alcohol use: No    Alcohol/week: 0.0 oz    Comment: occasionally  . Drug use: No  . Sexual activity: Not on file  Lifestyle  . Physical activity:    Days per week: 0 days    Minutes per session: 0 min  . Stress: Not at all  Relationships  . Social connections:    Talks on phone: More than three times a week    Gets together: More than three times a week    Attends religious service: More than 4 times per year    Active member of club or organization: Yes    Attends meetings of clubs or organizations: More than 4 times per year    Relationship status: Married  Other Topics Concern  . Not on file  Social History Narrative  . Not on file    Tobacco Counseling Counseling given: Not Answered  Activities of Daily Living In your present  state of health, do you have any difficulty performing the following activities: 02/23/2018  Hearing? N  Vision? N  Difficulty concentrating or making decisions? N  Walking or climbing stairs? N  Dressing or bathing? N  Doing errands, shopping? N  Preparing Food and eating ? N  Using the Toilet? N  In the past six months, have you accidently leaked urine? N  Do you have problems with loss of  bowel control? N  Managing your Medications? N  Managing your Finances? N  Housekeeping or managing your Housekeeping? N  Some recent data might be hidden     Immunizations and Health Maintenance Immunization History  Administered Date(s) Administered  . Influenza Split 08/28/2012  . Influenza Whole 08/08/2005, 08/19/2008, 09/28/2009, 09/28/2010  . Influenza,inj,Quad PF,6+ Mos 08/28/2013, 09/11/2014, 09/06/2016  . Influenza-Unspecified 08/12/2013, 09/12/2015  . Pneumococcal Conjugate-13 02/21/2017  . Pneumococcal Polysaccharide-23 01/16/2006, 02/23/2018  . Td 01/17/2005  . Tdap 02/27/2015  . Zoster 01/15/2013   Health Maintenance Due  Topic Date Due  . MAMMOGRAM  10/20/2016    Patient Care Team: Hoyt Koch, MD as PCP - General (Internal Medicine) Gatha Mayer, MD as Consulting Physician (Gastroenterology) Dian Queen, MD as Consulting Physician (Obstetrics and Gynecology) Almedia Balls, MD as Consulting Physician (Orthopedic Surgery) Jacelyn Pi, MD (Endocrinology)  Indicate any recent Medical Services you may have received from other than [facility] providers in the past year (date may be approximate).     Assessment:   This is a routine wellness examination for Rickiya. Physical assessment deferred to PCP.   Hearing/Vision screen Hearing Screening Comments: Able to hear conversational tones w/o difficulty. No issues reported. Passed whisper test    Vision Screening Comments: appointment yearly   Dietary issues and exercise activities discussed: Current  Exercise Habits: The patient has a physically strenous job, but has no regular exercise apart from work., Intensity: Mild, Exercise limited by: None identified  Diet (meal preparation, eat out, water intake, caffeinated beverages, dairy products, fruits and vegetables): in general, a "healthy" diet  , well balanced   Reviewed heart healthy diet, patient eats a variety of fruits and vegetables daily, limits salt, fat/cholesterol, sugar,carbohydrates,caffeine, drinks 6-8 glasses of water daily.  Goals    . Patient Stated     Maintain current health status. Continue to enjoy cooking, gardening, help to take care great grandchildren.      Depression Screen PHQ 2/9 Scores 02/23/2018 08/02/2017  PHQ - 2 Score 0 0  PHQ- 9 Score 3 -    Fall Risk Fall Risk  02/23/2018 08/02/2017  Falls in the past year? No No   Cognitive Function:       Ad8 score reviewed for issues:  Issues making decisions: no  Less interest in hobbies / activities: no  Repeats questions, stories (family complaining): no  Trouble using ordinary gadgets (microwave, computer, phone):no  Forgets the month or year: no  Mismanaging finances: no  Remembering appts: no  Daily problems with thinking and/or memory: no Ad8 score is= 0  Screening Tests Health Maintenance  Topic Date Due  . MAMMOGRAM  10/20/2016  . TETANUS/TDAP  02/26/2025  . COLONOSCOPY  07/19/2025  . INFLUENZA VACCINE  Completed  . DEXA SCAN  Completed  . Hepatitis C Screening  Completed  . PNA vac Low Risk Adult  Completed     Plan:   Continue doing brain stimulating activities (puzzles, reading, adult coloring books, staying active) to keep memory sharp.   Continue to eat heart healthy diet (full of fruits, vegetables, whole grains, lean protein, water--limit salt, fat, and sugar intake) and increase physical activity as tolerated.  I have personally reviewed and noted the following in the patient's chart:   . Medical and social  history . Use of alcohol, tobacco or illicit drugs  . Current medications and supplements . Functional ability and status . Nutritional status . Physical activity . Advanced directives . List of other physicians .  Vitals . Screenings to include cognitive, depression, and falls . Referrals and appointments  In addition, I have reviewed and discussed with patient certain preventive protocols, quality metrics, and best practice recommendations. A written personalized care plan for preventive services as well as general preventive health recommendations were provided to patient.     Michiel Cowboy, RN   02/23/2018

## 2018-02-23 NOTE — Progress Notes (Signed)
Medical screening examination/treatment/procedure(s) were performed by non-physician practitioner and as supervising physician I was immediately available for consultation/collaboration. I agree with above. Elizabeth A Crawford, MD 

## 2018-02-26 ENCOUNTER — Encounter: Payer: Self-pay | Admitting: Internal Medicine

## 2018-02-28 MED FILL — DICLOFENAC SODIUM 75 MG TAB: 75 | 30 days supply | Qty: 60 | Fill #5

## 2018-03-07 ENCOUNTER — Encounter: Payer: Self-pay | Admitting: Internal Medicine

## 2018-03-16 DIAGNOSIS — E119 Type 2 diabetes mellitus without complications: Secondary | ICD-10-CM | POA: Diagnosis not present

## 2018-03-16 DIAGNOSIS — Z961 Presence of intraocular lens: Secondary | ICD-10-CM | POA: Diagnosis not present

## 2018-03-16 DIAGNOSIS — H26492 Other secondary cataract, left eye: Secondary | ICD-10-CM | POA: Diagnosis not present

## 2018-03-16 DIAGNOSIS — H40013 Open angle with borderline findings, low risk, bilateral: Secondary | ICD-10-CM | POA: Diagnosis not present

## 2018-03-16 LAB — HM DIABETES EYE EXAM

## 2018-03-19 ENCOUNTER — Encounter: Payer: Self-pay | Admitting: Internal Medicine

## 2018-03-19 NOTE — Progress Notes (Signed)
Abstracted and sent to scan  

## 2018-04-30 MED FILL — DICLOFENAC SODIUM 75 MG TAB: 75 | 30 days supply | Qty: 60 | Fill #6

## 2018-05-16 IMAGING — MR MR HEAD W/O CM
8 series · 39 of 48 positions shown · non-contrast
Comparison: None.

CLINICAL DATA: 66-year-old hypertensive female with vertigo for 3
years, progressing. Initial encounter.

EXAM:
MRI HEAD WITHOUT CONTRAST
TECHNIQUE: Multiplanar, multiecho pulse sequences of the brain and surrounding
structures were obtained without intravenous contrast.

[Series 2: T1 · sagittal · 5.0mm · 0.45mm/px · 3 of 21 slices shown]
[im 1/21]
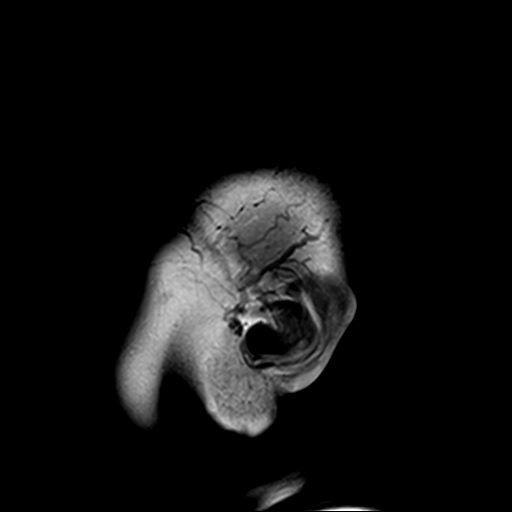
[im 11/21]
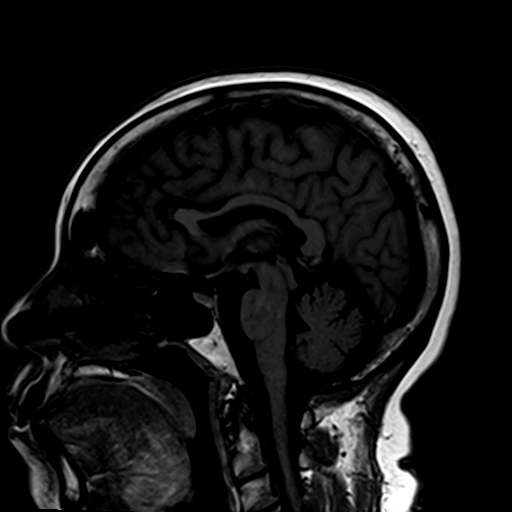
[im 21/21]
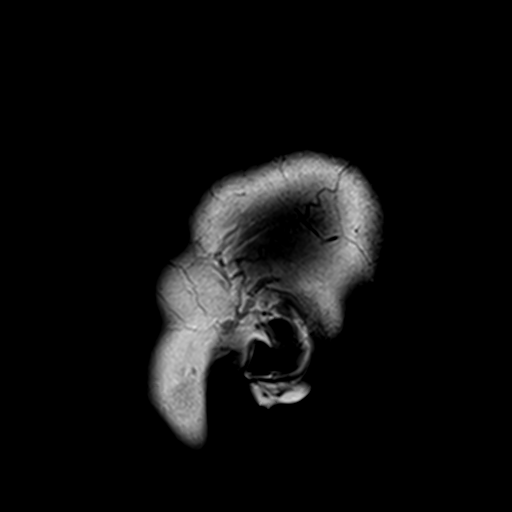

[Series 3: DWI · axial · 3.0mm · 1.80mm/px · z∈[-13,+129]mm · 8 of 100 slices shown (1 of 2)]
[im 1/100]
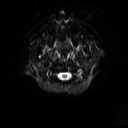
[im 12/100]
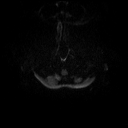
[im 34/100]
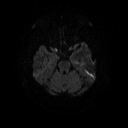
[im 45/100]
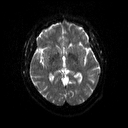
[im 56/100]
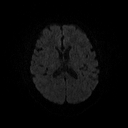
[im 67/100]
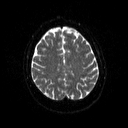
[im 89/100]
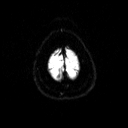
[im 100/100]
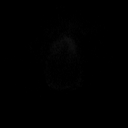

[Series 4: DWI · axial · 3.0mm · 1.80mm/px · z∈[-13,+129]mm · 5 of 50 slices shown (2 of 2)]
[im 1/50]
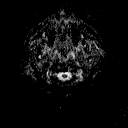
[im 13/50]
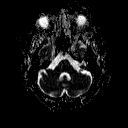
[im 25/50]
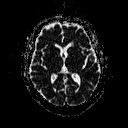
[im 37/50]
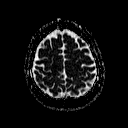
[im 50/50]
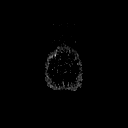

[Series 5: T2 · axial · 5.0mm · 0.51mm/px · z∈[-29,+115]mm · 2 of 22 slices shown (1 of 2)]
[im 1/22]
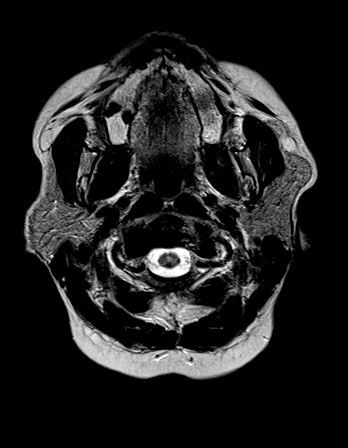
[im 22/22]
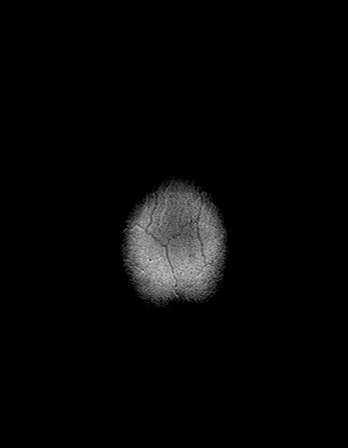

[Series 6: FLAIR · axial · 3.0mm · 0.45mm/px · z∈[-17,+111]mm · 4 of 43 slices shown]
[im 1/43]
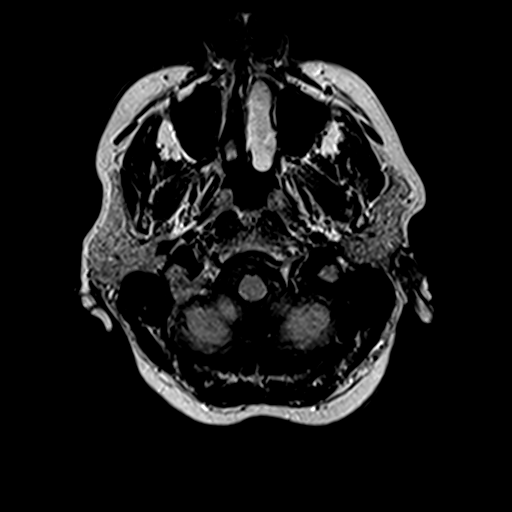
[im 15/43]
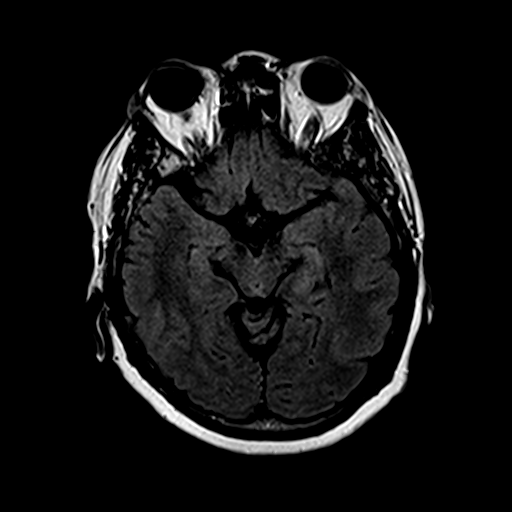
[im 29/43]
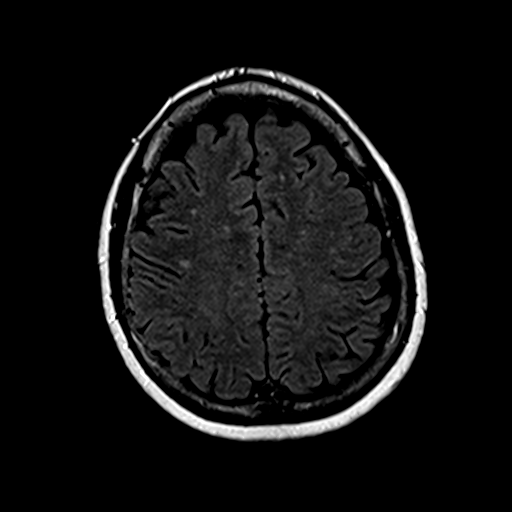
[im 43/43]
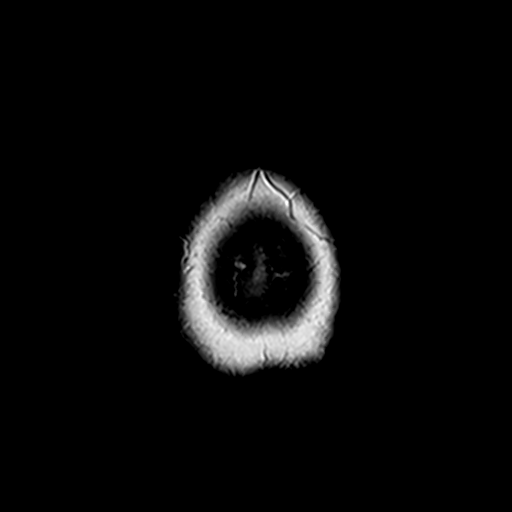

[Series 8: swi_images · axial · 2.0mm · 0.90mm/px · z∈[-27,+113]mm · 7 of 72 slices shown]
[im 1/72]
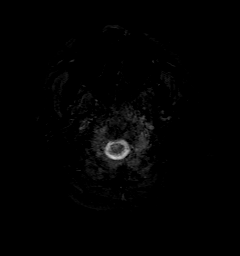
[im 12/72]
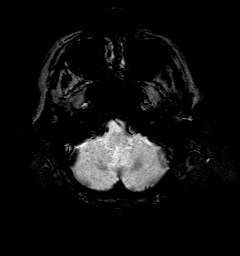
[im 24/72]
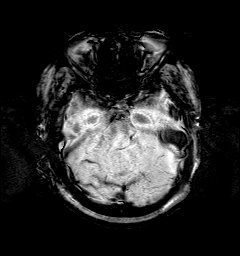
[im 36/72]
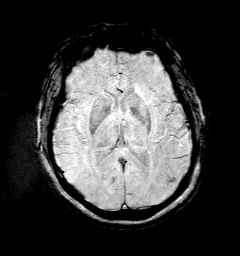
[im 48/72]
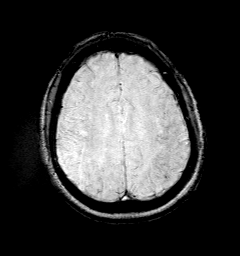
[im 60/72]
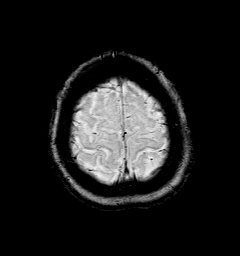
[im 72/72]
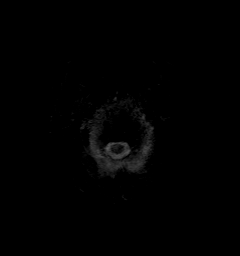

[Series 9: t1_mpr_tra · axial · 1.0mm · 0.45mm/px · z∈[-24,+96]mm · 7 of 144 slices shown]
[im 1/144]
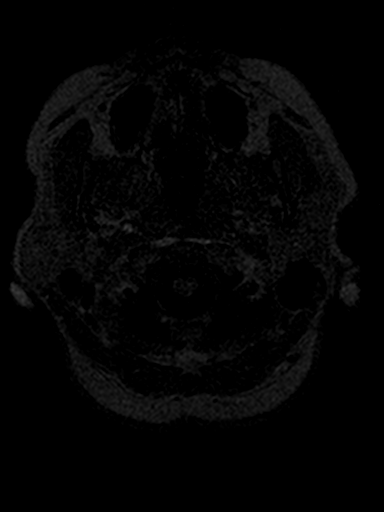
[im 23/144]
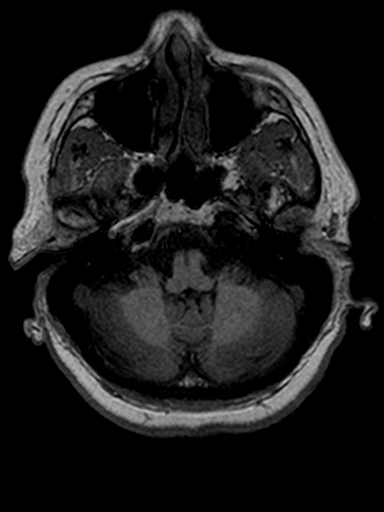
[im 45/144]
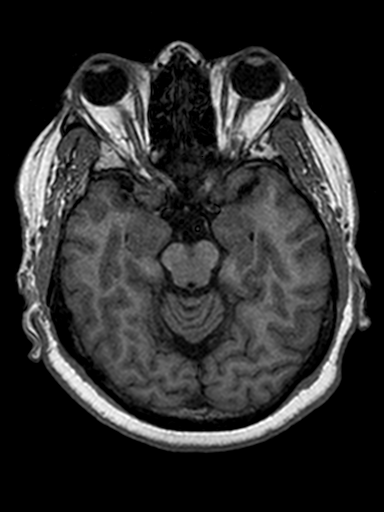
[im 67/144]
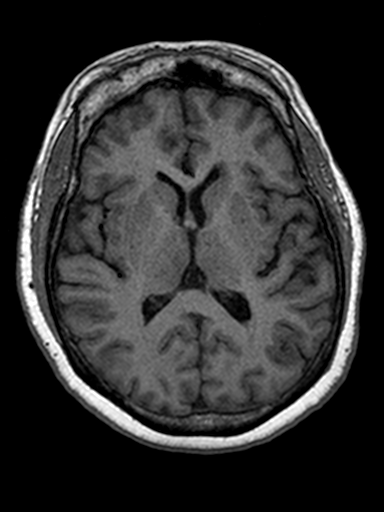
[im 78/144]
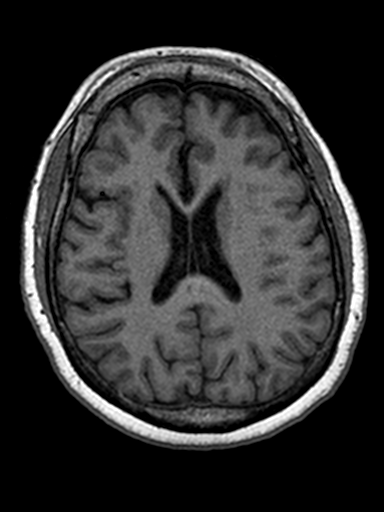
[im 100/144]
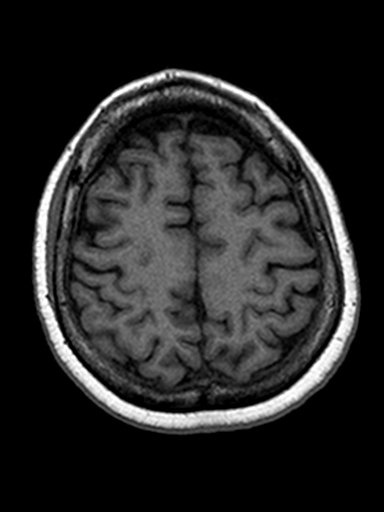
[im 122/144]
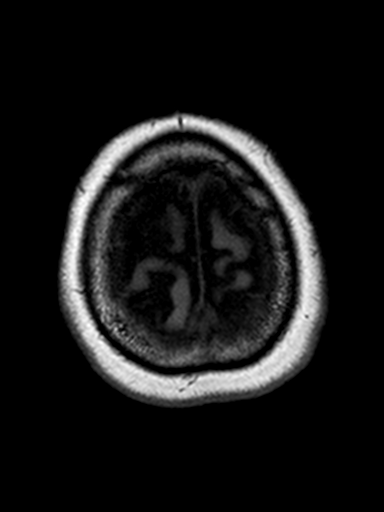

[Series 10: T2 · coronal · 5.0mm · 0.45mm/px · 3 of 25 slices shown (2 of 2)]
[im 1/25]
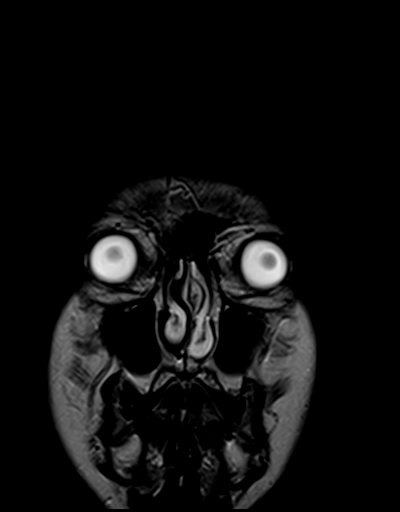
[im 13/25]
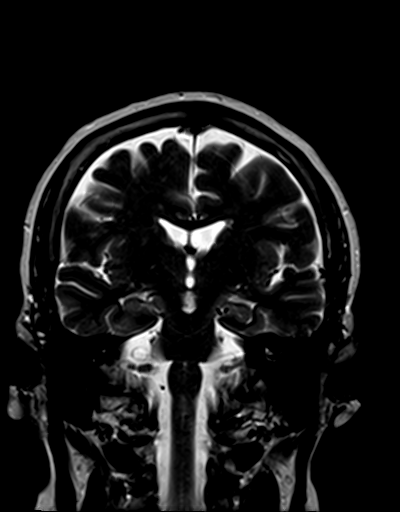
[im 25/25]
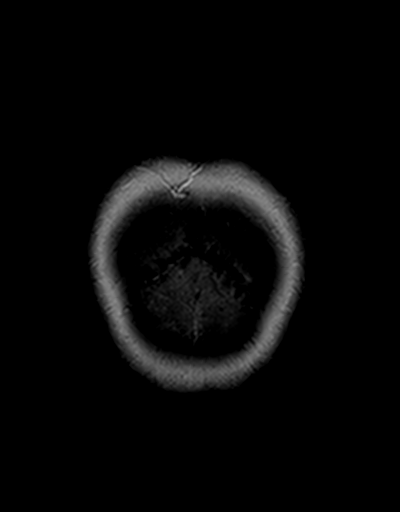

[39 of 48 positions shown; findings below may reference images not displayed]

FINDINGS: Brain: No acute infarct or intracranial hemorrhage.

Mild to slightly moderate nonspecific white matter type changes most
consistent with chronic microvascular disease in this hypertensive
patient with hyperlipidemia.

Mild atrophy without hydrocephalus.

No intracranial mass lesion noted on this unenhanced exam.

Vascular: Major intracranial vascular structures are patent.

Skull and upper cervical spine: Negative

Sinuses/Orbits: Mild exophthalmos. Symmetric normal extra-ocular
muscles.

Minimal mucosal thickening ethmoid sinus air cells.

Other: Negative
IMPRESSION: No acute intracranial abnormality.

Mild to slightly moderate chronic microvascular changes.

## 2018-05-21 ENCOUNTER — Encounter: Payer: Self-pay | Admitting: Internal Medicine

## 2018-05-21 ENCOUNTER — Ambulatory Visit (INDEPENDENT_AMBULATORY_CARE_PROVIDER_SITE_OTHER): Payer: Medicare Other | Admitting: Internal Medicine

## 2018-05-21 VITALS — BP 110/88 | HR 64 | Temp 98.0°F | Ht 61.0 in | Wt 157.0 lb

## 2018-05-21 DIAGNOSIS — M542 Cervicalgia: Secondary | ICD-10-CM | POA: Insufficient documentation

## 2018-05-21 DIAGNOSIS — R200 Anesthesia of skin: Secondary | ICD-10-CM | POA: Diagnosis not present

## 2018-05-21 MED ORDER — CYCLOBENZAPRINE HCL 5 MG PO TABS: 5.0000 mg | ORAL_TABLET | Freq: Three times a day (TID) | ORAL | 1 refills | Status: DC | PRN

## 2018-05-21 MED ORDER — PREDNISONE 20 MG PO TABS: 40.0000 mg | ORAL_TABLET | Freq: Every day | ORAL | 0 refills | Status: DC

## 2018-05-21 NOTE — Progress Notes (Signed)
   Subjective:    Patient ID: Gina Jefferson, female    DOB: 28-Feb-1951, 67 y.o.   MRN: [id number]  HPI The patient is a 67 YO female coming in for left facial numbness and left neck stiffness/pain. The neck pain and stiffness started about 6 weeks ago. She has tried aleve and voltaren which were not helpful. She is sometimes struggling to move her neck. Was diagnosed with shingles on the left face back in September and feels that this is not similar. That was just a rash without pain. Overall pain is worsening. 8/10 pain when bad.  Her left face numbness has been present and stable for about 2 weeks. She talked to a pa at her work and they told her it might be a sinus infection and recommended she talk to [place]. She is having some double or blurring vision but has not seen an eye specialist. She denies numbness in her hands or feet, no weakness in her arms or legs. Denies facial drooping, speech changes, memory changes, uneven smile. She denies trying anything for this. Came in today as it is getting worse instead of better.   Review of Systems  Constitutional: Positive for activity change. Negative for appetite change, chills, diaphoresis, fatigue, fever and unexpected weight change.  HENT: Negative.   Eyes: Negative.   Respiratory: Negative for cough, chest tightness and shortness of breath.   Cardiovascular: Negative for chest pain, palpitations and leg swelling.  Gastrointestinal: Negative for abdominal distention, abdominal pain, constipation, diarrhea, nausea and vomiting.  Musculoskeletal: Positive for myalgias, neck pain and neck stiffness.  Skin: Negative.   Neurological: Positive for numbness. Negative for dizziness, tremors, seizures, syncope, facial asymmetry, speech difficulty, weakness, light-headedness and headaches.  Psychiatric/Behavioral: Negative.       Objective:   Physical Exam  Constitutional: She is oriented to person, place, and time. She appears well-developed and  well-nourished.  HENT:  Head: Normocephalic and atraumatic.  Eyes: Pupils are equal, round, and reactive to light. Conjunctivae and EOM are normal.  Neck: Normal range of motion.  Cardiovascular: Normal rate and regular rhythm.  Pulmonary/Chest: Effort normal and breath sounds normal. No respiratory distress. She has no wheezes. She has no rales.  Abdominal: Soft. Bowel sounds are normal. She exhibits no distension. There is no tenderness. There is no rebound.  Musculoskeletal: She exhibits no edema.  Neurological: She is alert and oriented to person, place, and time. A cranial nerve deficit is present. Coordination normal.  Change in sensation in the left face in V1-3 distribution. CN 2-12 intact, no facial assymetry, eyes with normal ROM.   Skin: Skin is warm and dry.  Psychiatric: She has a normal mood and affect.   Vitals:   05/21/18 0757  BP: 110/88  Pulse: 64  Temp: 98 F (36.7 C)  TempSrc: Oral  SpO2: 94%  Weight: 157 lb (71.2 kg)  Height: 5\' 1"  (1.549 m)      Assessment & Plan:

## 2018-05-21 NOTE — Assessment & Plan Note (Addendum)
This is a new problem which is subacute (stable for 2 weeks now without change) which is concerning for stroke. We talked about possibilities include stroke, trigeminal neuralgia. We also talked about in the future numbness in face should be evaluated quickly at this time if stroke no intervention would be actionable. She is also having some eye changes although all muscles intact on exam today. Depending on MRI results will have her see her eye specialist. RX for prednisone to cover for possible trigeminal neuralgia.

## 2018-05-21 NOTE — Patient Instructions (Signed)
We have sent in a muscle relaxer to use for the neck that you can take up to 3 times per day.   We have sent in prednisone to take starting today. Take 2 pills daily for 1 week to see if this can help.   We are going to check an MRI of the brain to try to find out the cause. This could be caused by several different things including stroke, mini-stroke, trigeminal neuralgia (nerve irritation).

## 2018-05-21 NOTE — Assessment & Plan Note (Signed)
Getting MRI brain given concurrent concerning facial symptoms. This could be related to muscle strain or spasm. She has tight muscles on exam and sore to palpation paraspinally. Rx for prednisone at this time and flexeril. If no improvement and depending on MRI brain results may need neck imaging.

## 2018-05-28 ENCOUNTER — Encounter: Payer: Self-pay | Admitting: Internal Medicine

## 2018-06-06 ENCOUNTER — Encounter: Payer: Self-pay | Admitting: Internal Medicine

## 2018-06-06 ENCOUNTER — Ambulatory Visit
Admission: RE | Admit: 2018-06-06 | Discharge: 2018-06-06 | Disposition: A | Discharge status: Home / Self Care | Payer: Medicare Other | Source: Ambulatory Visit | Attending: Internal Medicine | Admitting: Internal Medicine

## 2018-06-06 DIAGNOSIS — R200 Anesthesia of skin: Secondary | ICD-10-CM

## 2018-06-06 DIAGNOSIS — M542 Cervicalgia: Secondary | ICD-10-CM

## 2018-06-06 DIAGNOSIS — R42 Dizziness and giddiness: Secondary | ICD-10-CM | POA: Diagnosis not present

## 2018-06-08 MED ORDER — PREDNISONE 20 MG PO TABS: 40.0000 mg | ORAL_TABLET | Freq: Every day | ORAL | 0 refills | Status: DC

## 2018-06-08 MED FILL — predniSONE 20 MG TABS: 20 | 7 days supply | Qty: 14 | Fill #0

## 2018-06-11 ENCOUNTER — Encounter: Payer: Self-pay | Admitting: Neurology

## 2018-06-22 ENCOUNTER — Other Ambulatory Visit (INDEPENDENT_AMBULATORY_CARE_PROVIDER_SITE_OTHER): Payer: Medicare Other

## 2018-06-22 ENCOUNTER — Encounter: Payer: Self-pay | Admitting: Neurology

## 2018-06-22 ENCOUNTER — Ambulatory Visit: Payer: Medicare Other | Admitting: Neurology

## 2018-06-22 VITALS — BP 110/70 | HR 86 | Ht 61.0 in | Wt 157.5 lb

## 2018-06-22 DIAGNOSIS — H9209 Otalgia, unspecified ear: Secondary | ICD-10-CM | POA: Diagnosis not present

## 2018-06-22 DIAGNOSIS — R202 Paresthesia of skin: Secondary | ICD-10-CM | POA: Diagnosis not present

## 2018-06-22 DIAGNOSIS — G5602 Carpal tunnel syndrome, left upper limb: Secondary | ICD-10-CM | POA: Diagnosis not present

## 2018-06-22 DIAGNOSIS — M7918 Myalgia, other site: Secondary | ICD-10-CM

## 2018-06-22 LAB — VITAMIN B12: Vitamin B-12: 891 pg/mL (ref 211–911)

## 2018-06-22 LAB — TSH: TSH: 0.81 u[IU]/mL (ref 0.35–4.50)

## 2018-06-22 LAB — C-REACTIVE PROTEIN: CRP: 0.4 mg/dL — ABNORMAL LOW (ref 0.5–20.0)

## 2018-06-22 LAB — SEDIMENTATION RATE: Sed Rate: 21 mm/hr (ref 0–30)

## 2018-06-22 NOTE — Patient Instructions (Addendum)
Check labs  I will send a message to your primary care doctor about the appropriate next step for you.  If your left hand tingling gets worse, please call my office to schedule nerve testing.  In the meantime, wear a wrist splint during the day and night.

## 2018-06-22 NOTE — Progress Notes (Signed)
[facility] Clinic Note - Initial Visit   Date: 06/22/18  Gina Jefferson MRN: [id number] DOB: 1950-12-18   Dear Dr. Sharlet Salina:  Thank you for your kind referral of Gina Jefferson for consultation of bilateral facial pain. Although her history is well known to you, please allow [place] to reiterate it for the purpose of our medical record. The patient was accompanied to the clinic by self.   History of Present Illness: Gina Jefferson is a 67 y.o. right-handed [place] American female with hypertension and seasonal allergies presenting for evaluation of bilateral facial pain and left hand numbness.    Starting around May 2019, she began having achy pain behind the ears, which radiates into her jaw and neck. Pain is very localized and sore behind the ears. It is worse with neck flexion and extension.  Sometimes, she has numbness over the cheeks, which started on the left and now also involves the right cheek.  She was started on prednisone by her PCP which always helps ease her symptoms, but once she completes the course, pain quickly returns.  She denies shooting, numbness, or tingling around the ear.  Pain is not worse with chewing, talking, or brushing teeth.  MRI brain in July 2019 was unremarkable.   She also complains of tingling in left hand for the past several years.  She had right CTS release.  Symptoms do not wake her up from sleeping, but has been using a wrist brace for the past year, which helps.  She has noticed that symptoms are worse, if she does not wear the brace.   Out-side paper records, electronic medical record, and images have been reviewed where available and summarized as:  MRI brain wo contrast 06/06/2018:  No cause of the presenting symptoms is identified. Mild chronic small-vessel ischemic changes of the cerebral hemispheric white matter, similar to the previous exam.  Lab Results  Component Value Date   TSH 1.51 02/18/2016   Lab Results    Component Value Date   VITAMINB12 331 02/13/2017     Past Medical History:  Diagnosis Date  . Anxiety   . Arthritis    knee, c-spine  . Carpal tunnel syndrome of right wrist 10/2011   s/p surgical release  . Colon polyp   . GERD   . Hx of adenomatous polyp of colon 2010  . Hyperlipidemia   . HYPERTENSION    under control; has been on med. x 15 yrs.  . Seasonal allergies     Past Surgical History:  Procedure Laterality Date  . ABDOMINAL HYSTERECTOMY     complete  . APPENDECTOMY    . BREAST REDUCTION SURGERY  2016  . CARPAL TUNNEL RELEASE  11/24/2011   Procedure: CARPAL TUNNEL RELEASE;  Surgeon: Garald Balding, MD;  Location: [facility];  Service: Orthopedics;  Laterality: Right;  CARPAL TUNNEL REL ON RIGHT   . CATARACT EXTRACTION, BILATERAL Bilateral 2018  . CESAREAN SECTION    . CHOLECYSTECTOMY    . COLONOSCOPY    . OOPHORECTOMY    . OVARIAN CYST REMOVAL    . POLYPECTOMY    . TONSILLECTOMY    . WISDOM TOOTH EXTRACTION       Medications:  Outpatient Encounter Medications as of 06/22/2018  Medication Sig Note  . amLODipine (NORVASC) 5 MG tablet Take 1 tablet (5 mg total) by mouth daily.   . Ascorbic Acid (VITAMIN C) 500 MG CAPS Take 500 mg by mouth daily.   Marland Kitchen  Biotin 1000 MCG tablet Take 1,000 mcg by mouth daily.     . cetirizine (ZYRTEC) 10 MG tablet Take 10 mg by mouth daily.     . Cholecalciferol (VITAMIN D PO) Take 5,000 Units by mouth daily.   . cyanocobalamin 1000 MCG tablet Take 1,000 mcg by mouth daily.   . cyclobenzaprine (FLEXERIL) 5 MG tablet Take 1 tablet (5 mg total) by mouth 3 (three) times daily as needed for muscle spasms.   . diclofenac (VOLTAREN) 75 MG EC tablet Take 75 mg by mouth 1 day or 1 dose.   . Diclofenac Sodium 3 % GEL Reported on 05/05/2016 05/05/2016: Received from: External Pharmacy  . fluticasone (FLONASE) 50 MCG/ACT nasal spray Place 2 sprays into both nostrils daily.   . Garlic 7824 MG CAPS Take by mouth daily.   Marland Kitchen  glucose blood (FREESTYLE TEST STRIPS) test strip Use as instructed   . meclizine (ANTIVERT) 12.5 MG tablet Take 1 tablet (12.5 mg total) by mouth 3 (three) times daily as needed for dizziness.   . Melatonin 10 MG TABS Take 10 mg by mouth at bedtime.   . ondansetron (ZOFRAN) 8 MG tablet Take 1 tablet (8 mg total) by mouth every 8 (eight) hours as needed for nausea or vomiting.   . triamcinolone cream (KENALOG) 0.1 % Apply 1 application topically 2 (two) times daily.   . valsartan (DIOVAN) 160 MG tablet Take 1 tablet (160 mg total) by mouth daily.   . vitamin E 400 UNIT capsule Take 400 Units by mouth daily.   . [DISCONTINUED] benzonatate (TESSALON) 200 MG capsule Take 1 capsule (200 mg total) by mouth 3 (three) times daily as needed for cough.   . [DISCONTINUED] Garlic 2353 MG TBEC Take by mouth daily.   . [DISCONTINUED] predniSONE (DELTASONE) 20 MG tablet Take 2 tablets (40 mg total) by mouth daily with breakfast.    No facility-administered encounter medications on file as of 06/22/2018.      Allergies:  Allergies  Allergen Reactions  . Clindamycin/Lincomycin Rash  . Ciprofloxacin Rash  . Penicillins Rash  . Statins Other (See Comments)    Muscle aches  . Metformin Diarrhea and Other (See Comments)    Vomiting     Family History: Family History  Problem Relation Age of Onset  . Diabetes Mother   . Stroke Mother   . Heart disease Mother   . Hyperlipidemia Mother   . Hypertension Mother   . Stroke Father   . Alzheimer's disease Father   . Mental illness Father   . Hypertension Father   . Hyperlipidemia Father   . Cancer Maternal Grandfather        stomach cancer  . Colon cancer Neg Hx     Social History: Social History   Tobacco Use  . Smoking status: Former Smoker    Types: Cigarettes    Last attempt to quit: 11/29/2003    Years since quitting: 14.5  . Smokeless tobacco: Never Used  Substance Use Topics  . Alcohol use: No    Alcohol/week: 0.0 oz    Comment:  occasionally  . Drug use: No   Social History   Social History Narrative  . Not on file    Review of Systems:  CONSTITUTIONAL: No fevers, chills, night sweats, or weight loss.   EYES: No visual changes or eye pain ENT: No hearing changes.  No history of nose bleeds.   RESPIRATORY: No cough, wheezing and shortness of breath.   CARDIOVASCULAR: Negative  for chest pain, and palpitations.   GI: Negative for abdominal discomfort, blood in stools or black stools.  No recent change in bowel habits.   GU:  No history of incontinence.   MUSCLOSKELETAL: No history of joint pain or swelling.  No myalgias.   SKIN: Negative for lesions, rash, and itching.   HEMATOLOGY/ONCOLOGY: Negative for prolonged bleeding, bruising easily, and swollen nodes.  No history of cancer.   ENDOCRINE: Negative for cold or heat intolerance, polydipsia or goiter.   PSYCH:  No depression or anxiety symptoms.   NEURO: As Above.   Vital Signs:  BP 110/70   Pulse 86   Ht 5' 1"  (1.549 m)   Wt 157 lb 8 oz (71.4 kg)   SpO2 97%   BMI 29.76 kg/m    General Medical Exam:   General:  Well appearing, comfortable.   Eyes/ENT: see cranial nerve examination.   Neck: No masses appreciated. She had point tenderness over the SCM insertion and mastoid area bilaterally.  Reduced neck extension, flexion, and rotation due to pain. No carotid bruits. Respiratory:  Clear to auscultation, good air entry bilaterally.   Cardiac:  Regular rate and rhythm, no murmur.   Extremities:  No deformities, edema, or skin discoloration.  Skin:  No rashes or lesions.  Neurological Exam: MENTAL STATUS including orientation to time, place, person, recent and remote memory, attention span and concentration, language, and fund of knowledge is normal.  Speech is not dysarthric.  CRANIAL NERVES: II:  No visual field defects.  Unremarkable fundi.   III-IV-VI: Pupils equal round and reactive to light.  Normal conjugate, extra-ocular eye movements in  all directions of gaze.  No nystagmus.  Mild left ptosis.   V:  Normal facial sensation.  Jaw jerk is absent.   VII:  Normal facial symmetry and movements.  No pathologic facial reflexes.  VIII:  Normal hearing and vestibular function.   IX-X:  Normal palatal movement.   XI:  Normal shoulder shrug and head rotation.   XII:  Normal tongue strength and range of motion, no deviation or fasciculation.  MOTOR:  No atrophy, fasciculations or abnormal movements.  No pronator drift.  Tone is normal.    Right Upper Extremity:    Left Upper Extremity:    Deltoid  5/5   Deltoid  5/5   Biceps  5/5   Biceps  5/5   Triceps  5/5   Triceps  5/5   Wrist extensors  5/5   Wrist extensors  5/5   Wrist flexors  5/5   Wrist flexors  5/5   Finger extensors  5/5   Finger extensors  5/5   Finger flexors  5/5   Finger flexors  5/5   Dorsal interossei  5/5   Dorsal interossei  5/5   Abductor pollicis  5/5   Abductor pollicis  4/5   Tone (Ashworth scale)  0  Tone (Ashworth scale)  0   Right Lower Extremity:    Left Lower Extremity:    Hip flexors  5/5   Hip flexors  5/5   Hip extensors  5/5   Hip extensors  5/5   Knee flexors  5/5   Knee flexors  5/5   Knee extensors  5/5   Knee extensors  5/5   Dorsiflexors  5/5   Dorsiflexors  5/5   Plantarflexors  5/5   Plantarflexors  5/5   Toe extensors  5/5   Toe extensors  5/5   Toe flexors  5/5   Toe flexors  5/5   Tone (Ashworth scale)  0  Tone (Ashworth scale)  0   MSRs:  Right                                                                 Left brachioradialis 2+  brachioradialis 2+  biceps 2+  biceps 2+  triceps 2+  triceps 2+  patellar 2+  patellar 2+  ankle jerk 2+  ankle jerk 2+  Hoffman no  Hoffman no  plantar response down  plantar response down   SENSORY:  Normal and symmetric perception of light touch, pinprick, vibration, and proprioception. Positive Tinel's sign at the left wrist.  Romberg's sign absent.   COORDINATION/GAIT: Normal finger-to-  nose-finger.  Intact rapid alternating movements bilaterally.  Gait narrow based and stable.    IMPRESSION: 1.  Bilateral posterior auricular pain, characterized by achy pain and soreness concerning for myofascial pain of the sternocleidomastoid muscle.  Pain is reproduced with palpation over the distal mastoid process as well as SCM insertion near the nuchal line bilaterally.  Although pain involves the mastoid area, doubt this is mastoiditis as symptoms are bilateral.  She may need to be evaluated by Sports Medicine vs ENT, and will kindly defer to her PCP to determine the appropriate specialist.     I reassured patient that I did not see anything worrisome stemming from primary neurological etiology.  I did personally review her non-contrasted MRI brain which shows mild age-appropriate changes.  MRI cervical spine (2012) was also viewed and shows multilevel degenerative changes and canal stenosis involving C4-C7 levels; however, if she was symptomatic from this, I would expect neck pain radiating into the arms, which she does not have.  2.  Left hand paresthesias are due to carpal tunnel syndrome.  Recommend NCS/EMG of the left hand, but she prefers to hold on this.  In the meantime, start to use wrist splint during the day and night.  Check vitamin B12, TSH, ESR, and CRP.    Thank you for allowing me to participate in patient's care.  If I can answer any additional questions, I would be pleased to do so.    Sincerely,    Clydine Parkison K. Posey Pronto, DO

## 2018-06-26 ENCOUNTER — Encounter: Payer: Self-pay | Admitting: Internal Medicine

## 2018-06-26 MED ORDER — TRAZODONE HCL 50 MG PO TABS
50.0000 mg | ORAL_TABLET | Freq: Every evening | ORAL | 3 refills | Status: DC | PRN
Start: 2018-06-26 — End: 2018-07-03

## 2018-06-29 ENCOUNTER — Encounter: Payer: Self-pay | Admitting: Internal Medicine

## 2018-07-03 MED ORDER — RAMELTEON 8 MG PO TABS: 8.0000 mg | ORAL_TABLET | Freq: Every day | ORAL | 3 refills | Status: DC

## 2018-08-13 DIAGNOSIS — M19032 Primary osteoarthritis, left wrist: Secondary | ICD-10-CM | POA: Diagnosis not present

## 2018-09-07 ENCOUNTER — Encounter

## 2018-09-07 ENCOUNTER — Ambulatory Visit: Payer: Medicare Other | Admitting: Neurology

## 2018-10-11 DIAGNOSIS — N958 Other specified menopausal and perimenopausal disorders: Secondary | ICD-10-CM | POA: Diagnosis not present

## 2018-10-11 DIAGNOSIS — Z1231 Encounter for screening mammogram for malignant neoplasm of breast: Secondary | ICD-10-CM | POA: Diagnosis not present

## 2018-10-11 LAB — HM DEXA SCAN: HM Dexa Scan: -1

## 2018-10-11 LAB — HM MAMMOGRAPHY

## 2018-11-20 DIAGNOSIS — R87615 Unsatisfactory cytologic smear of cervix: Secondary | ICD-10-CM | POA: Diagnosis not present

## 2018-12-17 DIAGNOSIS — H01009 Unspecified blepharitis unspecified eye, unspecified eyelid: Secondary | ICD-10-CM | POA: Diagnosis not present

## 2018-12-17 DIAGNOSIS — H00026 Hordeolum internum left eye, unspecified eyelid: Secondary | ICD-10-CM | POA: Diagnosis not present

## 2018-12-17 DIAGNOSIS — H40013 Open angle with borderline findings, low risk, bilateral: Secondary | ICD-10-CM | POA: Diagnosis not present

## 2019-01-25 ENCOUNTER — Telehealth: Payer: Medicare Other | Admitting: Family

## 2019-01-25 DIAGNOSIS — J111 Influenza due to unidentified influenza virus with other respiratory manifestations: Secondary | ICD-10-CM

## 2019-01-25 MED ORDER — OSELTAMIVIR PHOSPHATE 75 MG PO CAPS: 75.0000 mg | ORAL_CAPSULE | Freq: Two times a day (BID) | ORAL | 0 refills | Status: DC

## 2019-01-25 NOTE — Progress Notes (Signed)
Greater than 5 minutes, yet less than 10 minutes of time have been spent researching, coordinating, and implementing care for this patient today.  Thank you for the details you included in the comment boxes. Those details are very helpful in determining the best course of treatment for you and help [place] to provide the best care.  E visit for Flu like symptoms   We are sorry that you are not feeling well.  Here is how we plan to help! Based on what you have shared with me it looks like you may have a respiratory virus that may be influenza.  Influenza or "the flu" is   an infection caused by a respiratory virus. The flu virus is highly contagious and persons who did not receive their yearly flu vaccination may "catch" the flu from close contact.  We have anti-viral medications to treat the viruses that cause this infection. They are not a "cure" and only shorten the course of the infection. These prescriptions are most effective when they are given within the first 2 days of "flu" symptoms. Antiviral medication are indicated if you have a high risk of complications from the flu. You should  also consider an antiviral medication if you are in close contact with someone who is at risk. These medications can help patients avoid complications from the flu  but have side effects that you should know. Possible side effects from Tamiflu or oseltamivir include nausea, vomiting, diarrhea, dizziness, headaches, eye redness, sleep problems or other respiratory symptoms. You should not take Tamiflu if you have an allergy to oseltamivir or any to the ingredients in Tamiflu.  Based upon your symptoms and potential risk factors I have prescribed Oseltamivir (Tamiflu).  It has been sent to your designated pharmacy.  You will take one 75 mg capsule orally twice a day for the next 5 days.  ANYONE WHO HAS FLU SYMPTOMS SHOULD: . Stay home. The flu is highly contagious and going out or to work exposes others! . Be sure to  drink plenty of fluids. Water is fine as well as fruit juices, sodas and electrolyte beverages. You may want to stay away from caffeine or alcohol. If you are nauseated, try taking small sips of liquids. How do you know if you are getting enough fluid? Your urine should be a pale yellow or almost colorless. . Get rest. . Taking a steamy shower or using a humidifier may help nasal congestion and ease sore throat pain. Using a saline nasal spray works much the same way. . Cough drops, hard candies and sore throat lozenges may ease your cough. . Line up a caregiver. Have someone check on you regularly.   GET HELP RIGHT AWAY IF: . You cannot keep down liquids or your medications. . You become short of breath . Your fell like you are going to pass out or loose consciousness. . Your symptoms persist after you have completed your treatment plan MAKE SURE YOU   Understand these instructions.  Will watch your condition.  Will get help right away if you are not doing well or get worse.  Your e-visit answers were reviewed by a board certified advanced clinical practitioner to complete your personal care plan.  Depending on the condition, your plan could have included both over the counter or prescription medications.  If there is a problem please reply  once you have received a response from your provider.  Your safety is important to [place].  If you have drug allergies check  your prescription carefully.    You can use MyChart to ask questions about today's visit, request a non-urgent call back, or ask for a work or school excuse for 24 hours related to this e-Visit. If it has been greater than 24 hours you will need to follow up with your provider, or enter a new e-Visit to address those concerns.  You will get an e-mail in the next two days asking about your experience.  I hope that your e-visit has been valuable and will speed your recovery. Thank you for using e-visits.

## 2019-02-02 ENCOUNTER — Other Ambulatory Visit: Payer: Self-pay | Admitting: Internal Medicine

## 2019-02-25 ENCOUNTER — Ambulatory Visit: Payer: Medicare Other

## 2019-02-25 ENCOUNTER — Ambulatory Visit: Payer: Medicare Other | Admitting: Internal Medicine

## 2019-03-25 ENCOUNTER — Ambulatory Visit (INDEPENDENT_AMBULATORY_CARE_PROVIDER_SITE_OTHER): Payer: Medicare Other | Admitting: Internal Medicine

## 2019-03-25 ENCOUNTER — Encounter: Payer: Self-pay | Admitting: Internal Medicine

## 2019-03-25 DIAGNOSIS — J301 Allergic rhinitis due to pollen: Secondary | ICD-10-CM

## 2019-03-25 DIAGNOSIS — R42 Dizziness and giddiness: Secondary | ICD-10-CM | POA: Diagnosis not present

## 2019-03-25 DIAGNOSIS — I10 Essential (primary) hypertension: Secondary | ICD-10-CM | POA: Diagnosis not present

## 2019-03-25 MED ORDER — MECLIZINE HCL 12.5 MG PO TABS: 12.5000 mg | ORAL_TABLET | Freq: Three times a day (TID) | ORAL | 0 refills | Status: DC | PRN

## 2019-03-25 MED ORDER — AMLODIPINE BESYLATE 5 MG PO TABS: 5.0000 mg | ORAL_TABLET | Freq: Every day | ORAL | 1 refills | Status: DC

## 2019-03-25 MED ORDER — VALSARTAN 160 MG PO TABS: 160.0000 mg | ORAL_TABLET | Freq: Every day | ORAL | 1 refills | Status: DC

## 2019-03-25 MED ORDER — BENZONATATE 200 MG PO CAPS: 200.0000 mg | ORAL_CAPSULE | Freq: Three times a day (TID) | ORAL | 0 refills | Status: DC | PRN

## 2019-03-25 NOTE — Assessment & Plan Note (Signed)
Rx for tessalon perles which she uses for cough as needed during allergy season. Still using flonase and otc allergy medicine daily which helps significantly. Reminded about the importance of social distancing and no suspicion of covid-19 currently.

## 2019-03-25 NOTE — Assessment & Plan Note (Signed)
Mostly induced with motion sickness and refilled meclizine. Reminded about the risk of taking consistently and reinforced to take only prn. She is taking appropriately and will continue to use only as needed.

## 2019-03-25 NOTE — Assessment & Plan Note (Signed)
Taking amlodipine and valsartan and refilled today. Will defer labs until physical in a couple of months. BP is at goal previously and no change in medications to suggest need for change today.

## 2019-03-25 NOTE — Progress Notes (Signed)
Virtual Visit via Video Note  I connected with Gina Jefferson on 03/25/19 at 10:00 AM EDT by a video enabled telemedicine application and verified that I am speaking with the correct person using two identifiers.   I discussed the limitations of evaluation and management by telemedicine and the availability of in person appointments. The patient expressed understanding and agreed to proceed.  History of Present Illness: The patient is a 68 y.o. female presents for virtual visit for follow up of blood pressure (taking amlodipine and valsartan, denies side effects, denies headaches or chest pains, does not check BP at home but feels when it is high and has not been high recently, denies missing doses) and vertigo (uses meclizine rare, more often produced by motion sickness, denies using more than 1-2 per month, needs refill of meclizine, denies current dizziness, denies chest pains or new numbness or weakness) and allergic rhinitis (mild cough with allergies, typically for this time of year, with the pandemic doing more yard work than usual, denies fevers or chills, is practicing social distancing). She is slightly concerned that she is gaining some weight with being at home more and more time for cooking rather than just quick meals.    Observations/Objective: Appearance: normal, breathing appears normal, casual grooming, abdomen does not appear distended, throat normal, memory normal, mental status is A and O times 3  Assessment and Plan: See problem oriented charting  Follow Up Instructions:  I discussed the assessment and treatment plan with the patient. The patient was provided an opportunity to ask questions and all were answered. The patient agreed with the plan and demonstrated an understanding of the instructions.   The patient was advised to call back or seek an in-person evaluation if the symptoms worsen or if the condition fails to improve as anticipated.  Hoyt Koch, MD

## 2019-04-01 DIAGNOSIS — M1812 Unilateral primary osteoarthritis of first carpometacarpal joint, left hand: Secondary | ICD-10-CM | POA: Diagnosis not present

## 2019-04-05 ENCOUNTER — Ambulatory Visit: Payer: Medicare Other | Admitting: Internal Medicine

## 2019-04-23 ENCOUNTER — Other Ambulatory Visit: Payer: Self-pay | Admitting: Internal Medicine

## 2019-04-29 DIAGNOSIS — E119 Type 2 diabetes mellitus without complications: Secondary | ICD-10-CM | POA: Diagnosis not present

## 2019-04-29 DIAGNOSIS — H35013 Changes in retinal vascular appearance, bilateral: Secondary | ICD-10-CM | POA: Diagnosis not present

## 2019-04-29 DIAGNOSIS — H40013 Open angle with borderline findings, low risk, bilateral: Secondary | ICD-10-CM | POA: Diagnosis not present

## 2019-04-29 DIAGNOSIS — H26492 Other secondary cataract, left eye: Secondary | ICD-10-CM | POA: Diagnosis not present

## 2019-05-16 LAB — CBC AND DIFFERENTIAL
HCT: 45 (ref 36–46)
Hemoglobin: 13.9 (ref 12.0–16.0)
Platelets: 193 (ref 150–399)
WBC: 5.4

## 2019-05-16 LAB — BASIC METABOLIC PANEL
BUN: 15 (ref 4–21)
Creatinine: 0.7 (ref 0.5–1.1)
Glucose: 74

## 2019-05-16 LAB — LIPID PANEL
Cholesterol: 270 — AB (ref 0–200)
HDL: 55 (ref 35–70)
LDL Cholesterol: 185
Triglycerides: 148 (ref 40–160)

## 2019-05-16 LAB — HEPATIC FUNCTION PANEL
Alkaline Phosphatase: 91 (ref 25–125)
Bilirubin, Total: 47

## 2019-05-16 LAB — HEMOGLOBIN A1C: Hemoglobin A1C: 5.8

## 2019-06-21 ENCOUNTER — Ambulatory Visit: Payer: Medicare Other | Admitting: Internal Medicine

## 2019-06-28 ENCOUNTER — Other Ambulatory Visit: Payer: Self-pay

## 2019-06-28 ENCOUNTER — Encounter: Payer: Self-pay | Admitting: Internal Medicine

## 2019-06-28 ENCOUNTER — Ambulatory Visit (INDEPENDENT_AMBULATORY_CARE_PROVIDER_SITE_OTHER): Payer: Medicare Other | Admitting: Internal Medicine

## 2019-06-28 VITALS — BP 140/82 | HR 86 | Temp 99.1°F | Ht 61.0 in | Wt 163.0 lb

## 2019-06-28 DIAGNOSIS — E782 Mixed hyperlipidemia: Secondary | ICD-10-CM | POA: Diagnosis not present

## 2019-06-28 DIAGNOSIS — Z0000 Encounter for general adult medical examination without abnormal findings: Secondary | ICD-10-CM

## 2019-06-28 DIAGNOSIS — I10 Essential (primary) hypertension: Secondary | ICD-10-CM | POA: Diagnosis not present

## 2019-06-28 DIAGNOSIS — R7301 Impaired fasting glucose: Secondary | ICD-10-CM

## 2019-06-28 DIAGNOSIS — F5101 Primary insomnia: Secondary | ICD-10-CM

## 2019-06-28 MED ORDER — VALSARTAN 160 MG PO TABS: 160.0000 mg | ORAL_TABLET | Freq: Every day | ORAL | 3 refills | Status: DC

## 2019-06-28 MED ORDER — AMLODIPINE BESYLATE 5 MG PO TABS: 5.0000 mg | ORAL_TABLET | Freq: Every day | ORAL | 3 refills | Status: DC

## 2019-06-28 NOTE — Assessment & Plan Note (Signed)
Cholesterol is better than prior year but still above goal. She does have prior reaction with statins and does not want to retry.

## 2019-06-28 NOTE — Progress Notes (Signed)
Subjective:   Patient ID: Gina Jefferson, female    DOB: [birth year], 68 y.o.   MRN: [id number]  HPI Here for medicare wellness and physical, no new complaints. Please see A/P for status and treatment of chronic medical problems. Brought labs with her from outside testing.   Diet: heart healthy Physical activity: sedentary Depression/mood screen: negative Hearing: intact to whispered voice Visual acuity: grossly normal, performs annual eye exam  ADLs: capable Fall risk: none Home safety: good Cognitive evaluation: intact to orientation, naming, recall and repetition EOL planning: adv directives discussed    Office Visit from 06/28/2019 in [place]  PHQ-2 Total Score  0        Clinical Support from 02/23/2018 in [place]  PHQ-9 Total Score  3      I have personally reviewed and have noted 1. The patient's medical and social history - reviewed today no changes 2. Their use of alcohol, tobacco or illicit drugs 3. Their current medications and supplements 4. The patient's functional ability including ADL's, fall risks, home safety risks and hearing or visual impairment. 5. Diet and physical activities 6. Evidence for depression or mood disorders 7. Care team reviewed and updated  Patient Care Team: Hoyt Koch, MD as PCP - General (Internal Medicine) Gatha Mayer, MD as Consulting Physician (Gastroenterology) Dian Queen, MD as Consulting Physician (Obstetrics and Gynecology) Almedia Balls, MD as Consulting Physician (Orthopedic Surgery) Jacelyn Pi, MD (Endocrinology) Past Medical History:  Diagnosis Date  . Anxiety   . Arthritis    knee, c-spine  . Carpal tunnel syndrome of right wrist 10/2011   s/p surgical release  . Colon polyp   . GERD   . Hx of adenomatous polyp of colon 2010  . Hyperlipidemia   . HYPERTENSION    under control; has been on med. x 15 yrs.  . Seasonal allergies     Past Surgical History:  Procedure Laterality Date  . ABDOMINAL HYSTERECTOMY     complete  . APPENDECTOMY    . BREAST REDUCTION SURGERY  2016  . CARPAL TUNNEL RELEASE  11/24/2011   Procedure: CARPAL TUNNEL RELEASE;  Surgeon: Garald Balding, MD;  Location: [facility];  Service: Orthopedics;  Laterality: Right;  CARPAL TUNNEL REL ON RIGHT   . CATARACT EXTRACTION, BILATERAL Bilateral 2018  . CESAREAN SECTION    . CHOLECYSTECTOMY    . COLONOSCOPY    . OOPHORECTOMY    . OVARIAN CYST REMOVAL    . POLYPECTOMY    . TONSILLECTOMY    . WISDOM TOOTH EXTRACTION     Family History  Problem Relation Age of Onset  . Diabetes Mother   . Stroke Mother   . Heart disease Mother   . Hyperlipidemia Mother   . Hypertension Mother   . Stroke Father   . Alzheimer's disease Father   . Mental illness Father   . Hypertension Father   . Hyperlipidemia Father   . Cancer Maternal Grandfather        stomach cancer  . Colon cancer Neg Hx     Review of Systems  Constitutional: Negative.   HENT: Negative.   Eyes: Negative.   Respiratory: Negative for cough, chest tightness and shortness of breath.   Cardiovascular: Negative for chest pain, palpitations and leg swelling.  Gastrointestinal: Negative for abdominal distention, abdominal pain, constipation, diarrhea, nausea and vomiting.  Musculoskeletal: Negative.   Skin: Negative.   Neurological: Negative.  Psychiatric/Behavioral: Negative.     Objective:  Physical Exam Constitutional:      Appearance: She is well-developed.  HENT:     Head: Normocephalic and atraumatic.  Neck:     Musculoskeletal: Normal range of motion.  Cardiovascular:     Rate and Rhythm: Normal rate and regular rhythm.  Pulmonary:     Effort: Pulmonary effort is normal. No respiratory distress.     Breath sounds: Normal breath sounds. No wheezing or rales.  Abdominal:     General: Bowel sounds are normal. There is no distension.     Palpations:  Abdomen is soft.     Tenderness: There is no abdominal tenderness. There is no rebound.  Skin:    General: Skin is warm and dry.  Neurological:     Mental Status: She is alert and oriented to person, place, and time.     Coordination: Coordination normal.     Vitals:   06/28/19 1016  BP: 140/82  Pulse: 86  Temp: 99.1 F (37.3 C)  TempSrc: Oral  SpO2: 96%  Weight: 163 lb (73.9 kg)  Height: 5\' 1"  (1.549 m)    Assessment & Plan:

## 2019-06-28 NOTE — Progress Notes (Signed)
Abstracted and sent to scan  

## 2019-06-28 NOTE — Assessment & Plan Note (Signed)
Flu shot yearly. Pneumonia complete. Shingrix complete. Tetanus due 2026. Colonoscopy due 2026. Mammogram due 2021, pap smear aged out and dexa complete. Counseled about sun safety and mole surveillance. Counseled about the dangers of distracted driving. Given 10 year screening recommendations.

## 2019-06-28 NOTE — Progress Notes (Unsigned)
Abstracted and sent to scan  

## 2019-06-28 NOTE — Assessment & Plan Note (Signed)
BP at goal on amlodipine and valsartan. Outside labs reviewed and no changes indicated today. Refilled.

## 2019-06-28 NOTE — Assessment & Plan Note (Signed)
Uses rare ramelteon for sleep. Can refill if needed.

## 2019-06-28 NOTE — Assessment & Plan Note (Signed)
Recent HgA1c not in diabetic range. Continue to monitor yearly.

## 2019-06-28 NOTE — Patient Instructions (Signed)
Health Maintenance, Female Adopting a healthy lifestyle and getting preventive care are important in promoting health and wellness. Ask your health care provider about:  The right schedule for you to have regular tests and exams.  Things you can do on your own to prevent diseases and keep yourself healthy. What should I know about diet, weight, and exercise? Eat a healthy diet   Eat a diet that includes plenty of vegetables, fruits, low-fat dairy products, and lean protein.  Do not eat a lot of foods that are high in solid fats, added sugars, or sodium. Maintain a healthy weight Body mass index (BMI) is used to identify weight problems. It estimates body fat based on height and weight. Your health care provider can help determine your BMI and help you achieve or maintain a healthy weight. Get regular exercise Get regular exercise. This is one of the most important things you can do for your health. Most adults should:  Exercise for at least 150 minutes each week. The exercise should increase your heart rate and make you sweat (moderate-intensity exercise).  Do strengthening exercises at least twice a week. This is in addition to the moderate-intensity exercise.  Spend less time sitting. Even light physical activity can be beneficial. Watch cholesterol and blood lipids Have your blood tested for lipids and cholesterol at 67 years of age, then have this test every 5 years. Have your cholesterol levels checked more often if:  Your lipid or cholesterol levels are high.  You are older than 67 years of age.  You are at high risk for heart disease. What should I know about cancer screening? Depending on your health history and family history, you may need to have cancer screening at various ages. This may include screening for:  Breast cancer.  Cervical cancer.  Colorectal cancer.  Skin cancer.  Lung cancer. What should I know about heart disease, diabetes, and high blood  pressure? Blood pressure and heart disease  High blood pressure causes heart disease and increases the risk of stroke. This is more likely to develop in people who have high blood pressure readings, are of African descent, or are overweight.  Have your blood pressure checked: ? Every 3-5 years if you are 18-39 years of age. ? Every year if you are 40 years old or older. Diabetes Have regular diabetes screenings. This checks your fasting blood sugar level. Have the screening done:  Once every three years after age 40 if you are at a normal weight and have a low risk for diabetes.  More often and at a younger age if you are overweight or have a high risk for diabetes. What should I know about preventing infection? Hepatitis B If you have a higher risk for hepatitis B, you should be screened for this virus. Talk with your health care provider to find out if you are at risk for hepatitis B infection. Hepatitis C Testing is recommended for:  Everyone born from 1945 through 1965.  Anyone with known risk factors for hepatitis C. Sexually transmitted infections (STIs)  Get screened for STIs, including gonorrhea and chlamydia, if: ? You are sexually active and are younger than 67 years of age. ? You are older than 67 years of age and your health care provider tells you that you are at risk for this type of infection. ? Your sexual activity has changed since you were last screened, and you are at increased risk for chlamydia or gonorrhea. Ask your health care provider if   you are at risk.  Ask your health care provider about whether you are at high risk for HIV. Your health care provider may recommend a prescription medicine to help prevent HIV infection. If you choose to take medicine to prevent HIV, you should first get tested for HIV. You should then be tested every 3 months for as long as you are taking the medicine. Pregnancy  If you are about to stop having your period (premenopausal) and  you may become pregnant, seek counseling before you get pregnant.  Take 400 to 800 micrograms (mcg) of folic acid every day if you become pregnant.  Ask for birth control (contraception) if you want to prevent pregnancy. Osteoporosis and menopause Osteoporosis is a disease in which the bones lose minerals and strength with aging. This can result in bone fractures. If you are 65 years old or older, or if you are at risk for osteoporosis and fractures, ask your health care provider if you should:  Be screened for bone loss.  Take a calcium or vitamin D supplement to lower your risk of fractures.  Be given hormone replacement therapy (HRT) to treat symptoms of menopause. Follow these instructions at home: Lifestyle  Do not use any products that contain nicotine or tobacco, such as cigarettes, e-cigarettes, and chewing tobacco. If you need help quitting, ask your health care provider.  Do not use street drugs.  Do not share needles.  Ask your health care provider for help if you need support or information about quitting drugs. Alcohol use  Do not drink alcohol if: ? Your health care provider tells you not to drink. ? You are pregnant, may be pregnant, or are planning to become pregnant.  If you drink alcohol: ? Limit how much you use to 0-1 drink a day. ? Limit intake if you are breastfeeding.  Be aware of how much alcohol is in your drink. In the [place] one drink equals one 12 oz bottle of beer (355 mL), one 5 oz glass of wine (148 mL), or one 1 oz glass of hard liquor (44 mL). General instructions  Schedule regular health, dental, and eye exams.  Stay current with your vaccines.  Tell your health care provider if: ? You often feel depressed. ? You have ever been abused or do not feel safe at home. Summary  Adopting a healthy lifestyle and getting preventive care are important in promoting health and wellness.  Follow your health care provider's instructions about healthy  diet, exercising, and getting tested or screened for diseases.  Follow your health care provider's instructions on monitoring your cholesterol and blood pressure. This information is not intended to replace advice given to you by your health care provider. Make sure you discuss any questions you have with your health care provider. Document Released: 05/30/2011 Document Revised: 11/07/2018 Document Reviewed: 11/07/2018 Elsevier Patient Education  2020 [employer]  

## 2019-07-24 ENCOUNTER — Encounter: Payer: Self-pay | Admitting: Internal Medicine

## 2019-07-25 MED ORDER — ALPRAZOLAM 0.25 MG PO TABS: 0.2500 mg | ORAL_TABLET | Freq: Every day | ORAL | 0 refills | Status: DC | PRN

## 2019-08-01 DIAGNOSIS — M65341 Trigger finger, right ring finger: Secondary | ICD-10-CM | POA: Diagnosis not present

## 2019-08-08 ENCOUNTER — Telehealth: Payer: Medicare Other | Admitting: Physician Assistant

## 2019-08-08 DIAGNOSIS — J40 Bronchitis, not specified as acute or chronic: Secondary | ICD-10-CM

## 2019-08-08 MED ORDER — AZITHROMYCIN 250 MG PO TABS: ORAL_TABLET | ORAL | 0 refills | Status: DC

## 2019-08-08 MED ORDER — BENZONATATE 100 MG PO CAPS: 100.0000 mg | ORAL_CAPSULE | Freq: Two times a day (BID) | ORAL | 0 refills | Status: DC | PRN

## 2019-08-08 NOTE — Progress Notes (Addendum)
We are sorry that you are not feeling well.  Here is how we plan to help!  Based on your presentation I believe you most likely have A cough due to bacteria.  When patients have a fever and a productive cough with a change in color or increased sputum production, we are concerned about bacterial bronchitis.  If left untreated it can progress to pneumonia.  If your symptoms do not improve with your treatment plan it is important that you contact your provider.   I have prescribed Azithromyin 250 mg: two tablets now and then one tablet daily for 4 additonal days    In addition you may use A prescription cough medication called Tessalon Perles 100mg . You may take 1-2 capsules every 8 hours as needed for your cough.  I understand you had a negative COVID test. I still think you should stay home for another 3 days just in case you had a false negative  From your responses in the eVisit questionnaire you describe inflammation in the upper respiratory tract which is causing a significant cough.  This is commonly called Bronchitis and has four common causes:    Allergies  Viral Infections  Acid Reflux  Bacterial Infection  Allergies, viruses and acid reflux are treated by controlling symptoms or eliminating the cause. An example might be a cough caused by taking certain blood pressure medications. You stop the cough by changing the medication. Another example might be a cough caused by acid reflux. Controlling the reflux helps control the cough.  USE OF BRONCHODILATOR ("RESCUE") INHALERS: There is a risk from using your bronchodilator too frequently.  The risk is that over-reliance on a medication which only relaxes the muscles surrounding the breathing tubes can reduce the effectiveness of medications prescribed to reduce swelling and congestion of the tubes themselves.  Although you feel brief relief from the bronchodilator inhaler, your asthma may actually be worsening with the tubes becoming more  swollen and filled with mucus.  This can delay other crucial treatments, such as oral steroid medications. If you need to use a bronchodilator inhaler daily, several times per day, you should discuss this with your provider.  There are probably better treatments that could be used to keep your asthma under control.     HOME CARE . Only take medications as instructed by your medical team. . Complete the entire course of an antibiotic. . Drink plenty of fluids and get plenty of rest. . Avoid close contacts especially the very young and the elderly . Cover your mouth if you cough or cough into your sleeve. . Always remember to wash your hands . A steam or ultrasonic humidifier can help congestion.   GET HELP RIGHT AWAY IF: . You develop worsening fever. . You become short of breath . You cough up blood. . Your symptoms persist after you have completed your treatment plan MAKE SURE YOU   Understand these instructions.  Will watch your condition.  Will get help right away if you are not doing well or get worse.  Your e-visit answers were reviewed by a board certified advanced clinical practitioner to complete your personal care plan.  Depending on the condition, your plan could have included both over the counter or prescription medications. If there is a problem please reply  once you have received a response from your provider. Your safety is important to [place].  If you have drug allergies check your prescription carefully.    You can use MyChart to ask  questions about today's visit, request a non-urgent call back, or ask for a work or school excuse for 24 hours related to this e-Visit. If it has been greater than 24 hours you will need to follow up with your provider, or enter a new e-Visit to address those concerns. You will get an e-mail in the next two days asking about your experience.  I hope that your e-visit has been valuable and will speed your recovery. Thank you for using  e-visits.   A total of 5 minutes was spent on this patient's chart

## 2019-08-21 ENCOUNTER — Ambulatory Visit (INDEPENDENT_AMBULATORY_CARE_PROVIDER_SITE_OTHER): Payer: Medicare Other

## 2019-08-21 DIAGNOSIS — Z23 Encounter for immunization: Secondary | ICD-10-CM | POA: Diagnosis not present

## 2019-08-29 IMAGING — MR MR HEAD W/O CM
10 series · 48 of 48 positions shown · non-contrast
Comparison: 02/21/2017

CLINICAL DATA: Progressive vertigo over the last 3 years. Facial
numbness.

EXAM:
MRI HEAD WITHOUT CONTRAST
TECHNIQUE: Multiplanar, multiecho pulse sequences of the brain and surrounding
structures were obtained without intravenous contrast.

[Series 2: T1 · sagittal · 5.0mm · 0.45mm/px · 2 of 21 slices shown]
[im 1/21]
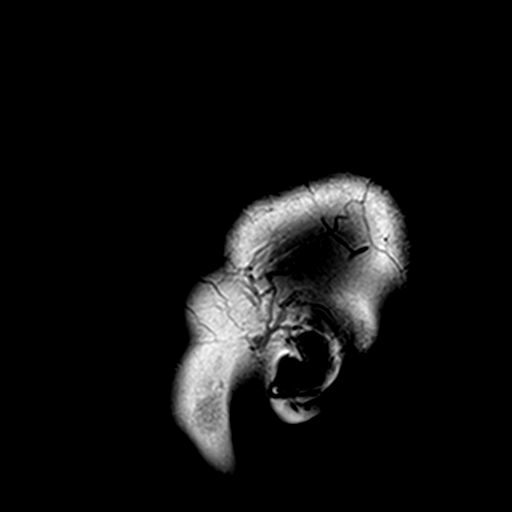
[im 21/21]
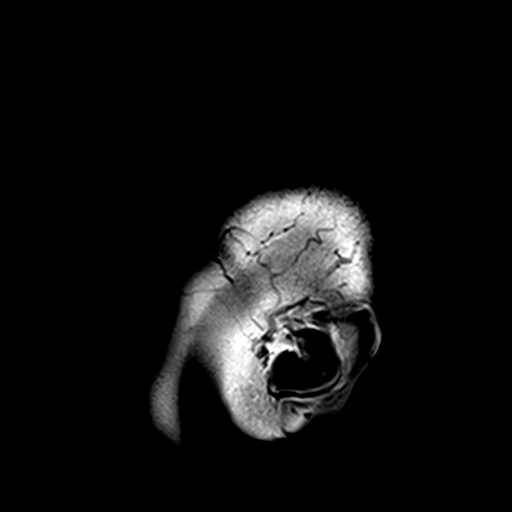

[Series 3: DWI · axial · 3.0mm · 1.80mm/px · z∈[-84,+62]mm · 9 of 100 slices shown (1 of 4)]
[im 1/100]
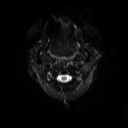
[im 13/100]
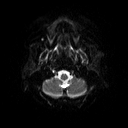
[im 25/100]
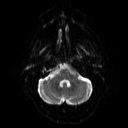
[im 38/100]
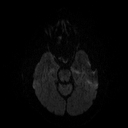
[im 50/100]
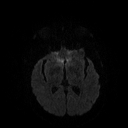
[im 62/100]
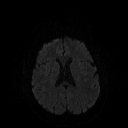
[im 75/100]
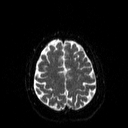
[im 87/100]
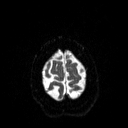
[im 100/100]
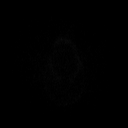

[Series 4: DWI · axial · 3.0mm · 1.80mm/px · z∈[-84,+62]mm · 5 of 50 slices shown (2 of 4)]
[im 1/50]
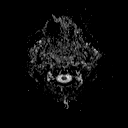
[im 13/50]
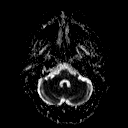
[im 25/50]
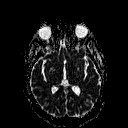
[im 37/50]
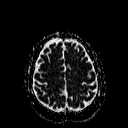
[im 50/50]
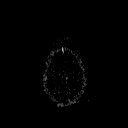

[Series 5: DWI · coronal · 5.0mm · 1.80mm/px · 6 of 66 slices shown (3 of 4)]
[im 1/66]
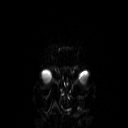
[im 14/66]
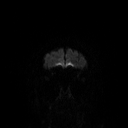
[im 27/66]
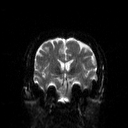
[im 40/66]
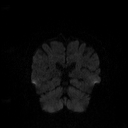
[im 53/66]
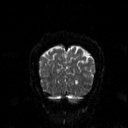
[im 66/66]
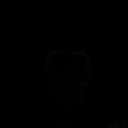

[Series 6: DWI · coronal · 5.0mm · 1.80mm/px · 3 of 34 slices shown (4 of 4)]
[im 1/34]
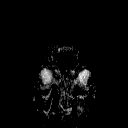
[im 17/34]
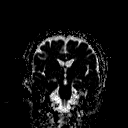
[im 34/34]
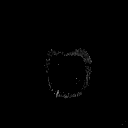

[Series 7: T2 · axial · 5.0mm · 0.51mm/px · z∈[-86,+61]mm · 2 of 22 slices shown (1 of 2)]
[im 1/22]
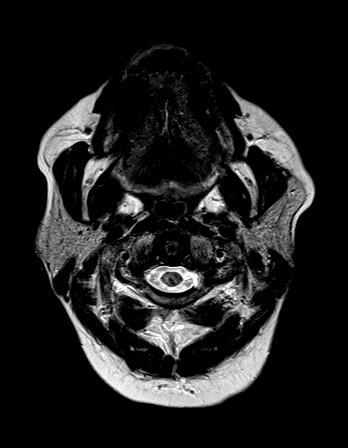
[im 22/22]
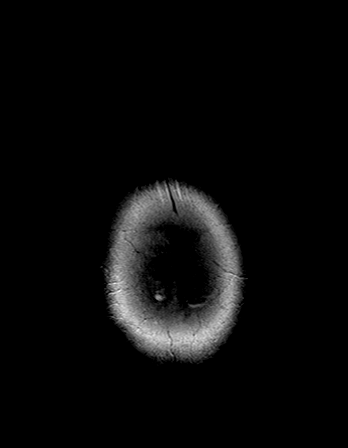

[Series 8: FLAIR · axial · 3.0mm · 0.45mm/px · z∈[-81,+54]mm · 3 of 30 slices shown]
[im 1/30]
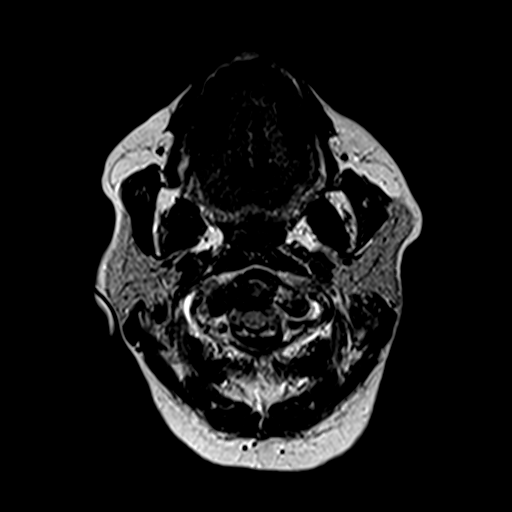
[im 15/30]
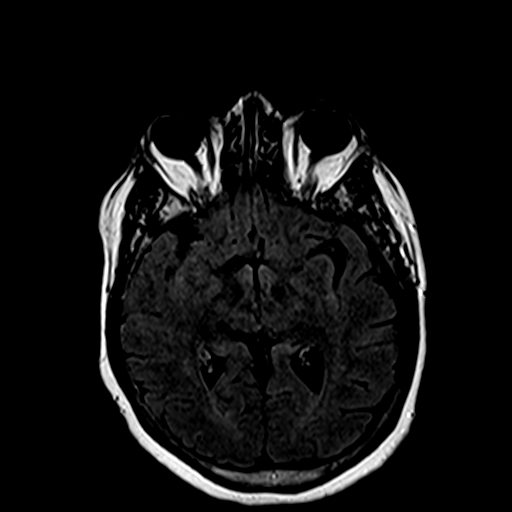
[im 30/30]
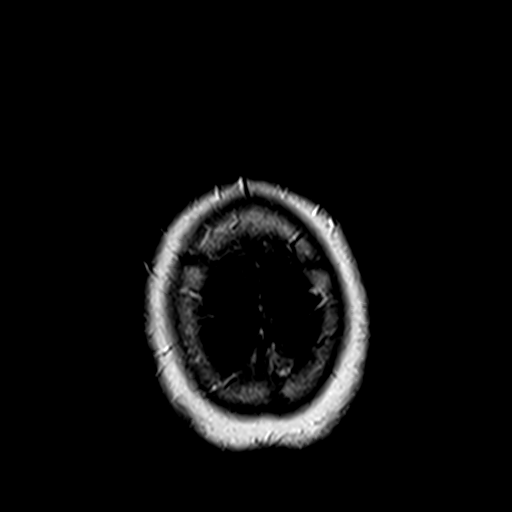

[Series 10: swi_images · axial · 5.0mm · 0.90mm/px · z∈[-86,+59]mm · 3 of 30 slices shown]
[im 1/30]
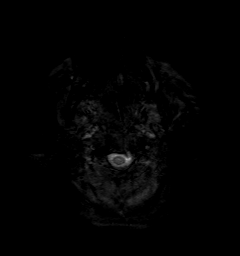
[im 15/30]
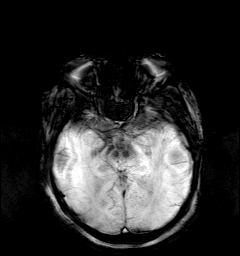
[im 30/30]
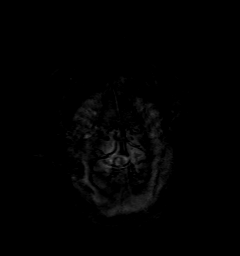

[Series 11: t1_mpr_tra · axial · 1.0mm · 0.71mm/px · z∈[-86,+56]mm · 13 of 144 slices shown]
[im 1/144]
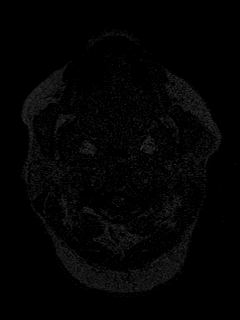
[im 12/144]
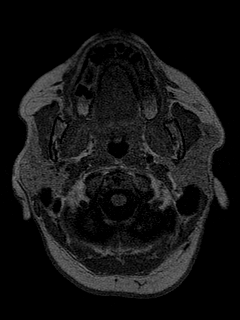
[im 24/144]
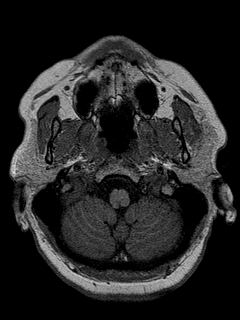
[im 36/144]
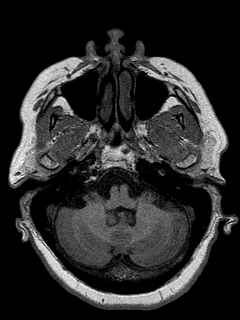
[im 48/144]
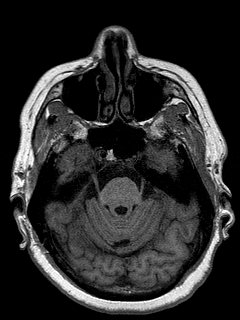
[im 60/144]
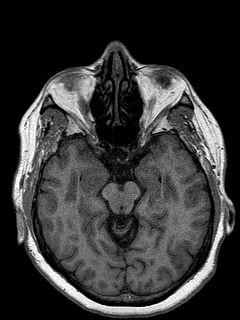
[im 72/144]
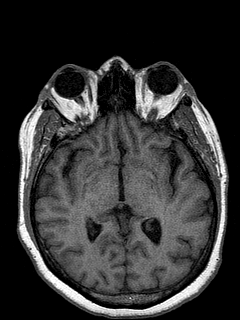
[im 84/144]
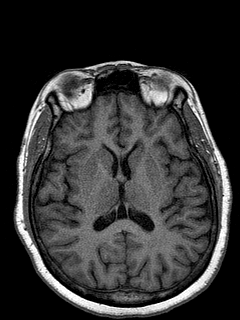
[im 96/144]
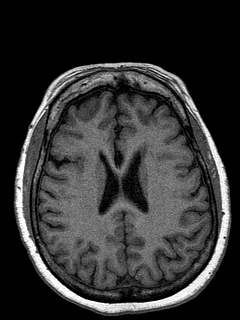
[im 108/144]
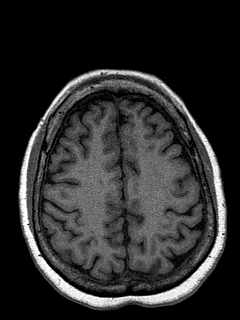
[im 120/144]
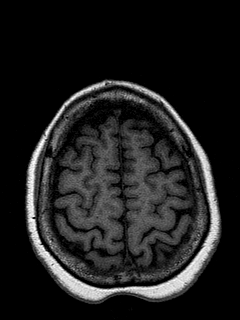
[im 132/144]
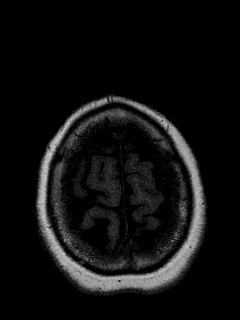
[im 144/144]
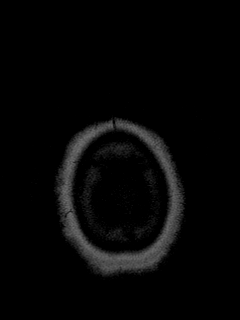

[Series 12: T2 · coronal · 5.0mm · 0.45mm/px · 2 of 25 slices shown (2 of 2)]
[im 1/25]
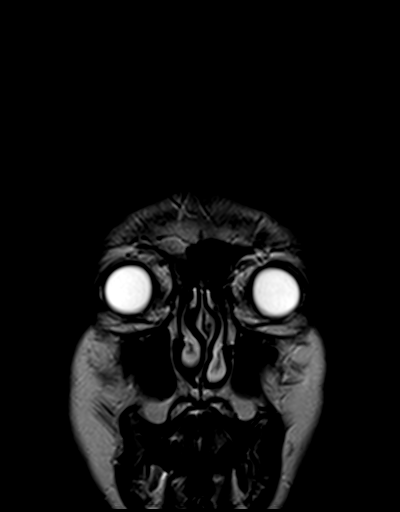
[im 25/25]
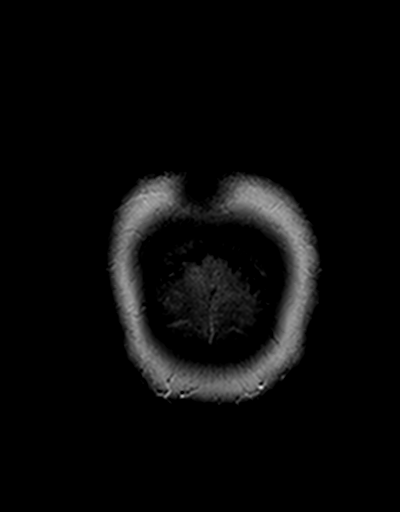

[48 of 48 positions shown; findings below may reference images not displayed]

FINDINGS: Brain: Diffusion imaging does not show any acute or subacute
infarction. The brainstem and cerebellum are normal. Cerebral
hemispheres show mild chronic small-vessel changes of the deep and
subcortical white matter, not apparently progressive since last
year. No cortical or large vessel territory infarction. No mass
lesion, hemorrhage, hydrocephalus or extra-axial collection. No CP
angle lesion seen.

Vascular: Major vessels at the base of the brain show flow.

Skull and upper cervical spine: Negative

Sinuses/Orbits: Clear/normal.  Mastoids are clear.

Other: None
IMPRESSION: No cause of the presenting symptoms is identified. Mild chronic
small-vessel ischemic changes of the cerebral hemispheric white
matter, similar to the previous exam.

## 2019-09-24 ENCOUNTER — Telehealth: Payer: Self-pay | Admitting: Internal Medicine

## 2019-09-24 NOTE — Telephone Encounter (Signed)
LVM letting patient know placed up front for pick up

## 2019-09-24 NOTE — Telephone Encounter (Signed)
Patient called and would like a copy of her when she got her flu shot. If she can come and pick up a copy of it, thanks.

## 2019-10-10 ENCOUNTER — Encounter: Payer: Self-pay | Admitting: Internal Medicine

## 2019-11-04 DIAGNOSIS — H1045 Other chronic allergic conjunctivitis: Secondary | ICD-10-CM | POA: Diagnosis not present

## 2019-11-04 DIAGNOSIS — H18593 Other hereditary corneal dystrophies, bilateral: Secondary | ICD-10-CM | POA: Diagnosis not present

## 2019-11-04 DIAGNOSIS — H2640 Unspecified secondary cataract: Secondary | ICD-10-CM | POA: Diagnosis not present

## 2019-11-04 DIAGNOSIS — H40013 Open angle with borderline findings, low risk, bilateral: Secondary | ICD-10-CM | POA: Diagnosis not present

## 2019-11-11 DIAGNOSIS — Z1231 Encounter for screening mammogram for malignant neoplasm of breast: Secondary | ICD-10-CM | POA: Diagnosis not present

## 2019-12-25 ENCOUNTER — Ambulatory Visit: Payer: Medicare Other | Admitting: Physician Assistant

## 2019-12-25 ENCOUNTER — Encounter: Payer: Self-pay | Admitting: Physician Assistant

## 2019-12-25 VITALS — BP 144/86 | HR 96 | Temp 97.4°F | Ht 61.0 in | Wt 160.0 lb

## 2019-12-25 DIAGNOSIS — R1013 Epigastric pain: Secondary | ICD-10-CM | POA: Diagnosis not present

## 2019-12-25 DIAGNOSIS — K219 Gastro-esophageal reflux disease without esophagitis: Secondary | ICD-10-CM

## 2019-12-25 MED ORDER — LANSOPRAZOLE 30 MG PO CPDR: 30.0000 mg | DELAYED_RELEASE_CAPSULE | Freq: Every day | ORAL | 11 refills | Status: DC

## 2019-12-25 MED ORDER — DICYCLOMINE HCL 10 MG PO CAPS: 10.0000 mg | ORAL_CAPSULE | Freq: Two times a day (BID) | ORAL | 8 refills | Status: DC | PRN

## 2019-12-25 NOTE — Progress Notes (Signed)
Subjective:    Patient ID: Gina Jefferson, female    DOB: [birth year], 69 y.o.   MRN: [id number]  HPI Gina Jefferson is a very nice 69 year old female, known to Dr. Carlean Purl, and employee of [employer].  She has history of hypertension, hyperlipidemia, colon polyps and diverticulosis and has also been treated for GERD in the past. Recent and symptoms, primarily occurring during the daytime.  She has also been having some early morning nausea without vomiting.  She has not been on any treatment on a regular basis but has used over-the-counter Prevacid as needed. She is also been having upper abdominal cramping intermittently at nighttime which will sometimes keep her awake for a while.  This has been occurring over the past 2 to 3 months bothers her at times during the day as well.  Is not had any changes in her bowel habits, no melena or hematochezia.  Appetite is been fine, weight has been stable. She has been taking diclofenac twice daily for back pain for the past several months.  She also feels that she has been under an increased amount of stress.  Review of Systems Pertinent positive and negative review of systems were noted in the above HPI section.  All other review of systems was otherwise negative.  Outpatient Encounter Medications as of 12/25/2019  Medication Sig  . ALPRAZolam (XANAX) 0.25 MG tablet Take 1 tablet (0.25 mg total) by mouth daily as needed for anxiety.  Marland Kitchen amLODipine (NORVASC) 5 MG tablet Take 1 tablet (5 mg total) by mouth daily.  . Ascorbic Acid (VITAMIN C) 500 MG CAPS Take 500 mg by mouth daily.  . benzonatate (TESSALON) 100 MG capsule Take 1 capsule (100 mg total) by mouth 2 (two) times daily as needed for cough.  . Biotin 1000 MCG tablet Take 1,000 mcg by mouth daily.    . cetirizine (ZYRTEC) 10 MG tablet Take 10 mg by mouth daily.    . Cholecalciferol (VITAMIN D PO) Take 5,000 Units by mouth daily.  . diclofenac (VOLTAREN) 75 MG EC tablet Take 75 mg by mouth 1 day or 1 dose.   . Diclofenac Sodium 3 % GEL Reported on 05/05/2016  . dicyclomine (BENTYL) 10 MG capsule Take 1 capsule (10 mg total) by mouth 2 (two) times daily as needed for spasms.  . Garlic 123XX123 MG CAPS Take by mouth daily.  . lansoprazole (PREVACID) 30 MG capsule Take 1 capsule (30 mg total) by mouth at bedtime.  . meclizine (ANTIVERT) 12.5 MG tablet Take 1 tablet (12.5 mg total) by mouth 3 (three) times daily as needed for dizziness.  . ondansetron (ZOFRAN) 8 MG tablet Take 1 tablet (8 mg total) by mouth every 8 (eight) hours as needed for nausea or vomiting.  . ramelteon (ROZEREM) 8 MG tablet Take 1 tablet (8 mg total) by mouth at bedtime.  . valsartan (DIOVAN) 160 MG tablet Take 1 tablet (160 mg total) by mouth daily.  . [DISCONTINUED] azithromycin (ZITHROMAX) 250 MG tablet Take 2 tablets on day 1, then 1 tablet a day until gone  . [DISCONTINUED] fluticasone (FLONASE) 50 MCG/ACT nasal spray Place 2 sprays into both nostrils daily.  . [DISCONTINUED] glucose blood (FREESTYLE TEST STRIPS) test strip Use as instructed   No facility-administered encounter medications on file as of 12/25/2019.   Allergies  Allergen Reactions  . Clindamycin/Lincomycin Rash  . Ciprofloxacin Rash  . Penicillins Rash  . Statins Other (See Comments)    Muscle aches  . Metformin Diarrhea and  Other (See Comments)    Vomiting    Patient Active Problem List   Diagnosis Date Noted  . Left facial numbness 05/21/2018  . Neck pain on left side 05/21/2018  . Vertigo 12/11/2015  . Routine general medical examination at a health care facility 02/12/2015  . Insomnia   . Impaired fasting blood sugar 01/21/2011  . Allergic rhinitis 09/24/2010  . Hyperlipidemia 06/23/2009  . Essential hypertension 06/23/2009  . 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   Social History   Socioeconomic History  . Marital status: Married    Spouse name: Not on file  . Number of children: Not on file  . Years of education: Not on file  . Highest education level:  Not on file  Occupational History  . Not on file  Tobacco Use  . Smoking status: Former Smoker    Types: Cigarettes    Quit date: 11/29/2003    Years since quitting: 16.0  . Smokeless tobacco: Never Used  Substance and Sexual Activity  . Alcohol use: No    Alcohol/week: 0.0 standard drinks    Comment: occasionally  . Drug use: No  . Sexual activity: Not on file  Other Topics Concern  . Not on file  Social History Narrative   Works at [employer] as [place].   Lives with husband.  She has two adult children.    Social Determinants of Health   Financial Resource Strain:   . Difficulty of Paying Living Expenses: Not on file  Food Insecurity:   . Worried About Charity fundraiser in the Last Year: Not on file  . Ran Out of Food in the Last Year: Not on file  Transportation Needs:   . Lack of Transportation (Medical): Not on file  . Lack of Transportation (Non-Medical): Not on file  Physical Activity:   . Days of Exercise per Week: Not on file  . Minutes of Exercise per Session: Not on file  Stress:   . Feeling of Stress : Not on file  Social Connections:   . Frequency of Communication with Friends and Family: Not on file  . Frequency of Social Gatherings with Friends and Family: Not on file  . Attends Religious Services: Not on file  . Active Member of Clubs or Organizations: Not on file  . Attends Archivist Meetings: Not on file  . Marital Status: Not on file  Intimate Partner Violence:   . Fear of Current or Ex-Partner: Not on file  . Emotionally Abused: Not on file  . Physically Abused: Not on file  . Sexually Abused: Not on file    Ms. Metter's family history includes Alzheimer's disease in her father; Cancer in her maternal grandfather; Diabetes in her mother; Heart disease in her mother; Hyperlipidemia in her father and mother; Hypertension in her father and mother; Mental illness in her father; Stroke in her father and mother.      Objective:     Vitals:   12/25/19 1106  BP: (!) 144/86  Pulse: 96  Temp: (!) 97.4 F (36.3 C)    Physical Exam Well-developed well-nourished female in no acute distress.  Height, Weight,160 BMI30.2  HEENT; nontraumatic normocephalic, EOMI, PER R LA, sclera anicteric. Oropharynx; not examined/mask/Covid Neck; supple, no JVD Cardiovascular; regular rate and rhythm with S1-S2, no murmur rub or gallop Pulmonary; Clear bilaterally Abdomen; soft, nontender, nondistended, no palpable mass or hepatosplenomegaly, bowel sounds are active Rectal; not done today Skin; benign exam, no jaundice rash or appreciable lesions Extremities; no  clubbing cyanosis or edema skin warm and dry Neuro/Psych; alert and oriented x4, grossly nonfocal mood and affect appropriate       Assessment & Plan:   #68 69 year old female with history of GERD with recurrence of more persistent symptoms. #2 epigastric cramping/dyspepsia-suspect this may be NSAID induced gastropathy, versus IBS  #3 diverticulosis #4.  Colon cancer surveillance-up-to-date with last colonoscopy 06/2015, 1 polyp removed/hyperplastic, due for follow-up 2026  Plan; will start Prevacid 30 mg p.o. every morning to be taken on a regular basis over the next couple of months. Start Bentyl 10 mg p.o. twice daily as needed. Patient encouraged to decrease NSAIDs as much as possible as likely this is contributing to her epigastric discomfort. If she does not have any improvement in symptoms over the next few weeks she may need further work-up with EGD and/or imaging.    Korinne Greenstein Genia Harold PA-C 12/25/2019   Cc: Hoyt Koch, *

## 2019-12-25 NOTE — Patient Instructions (Signed)
If you are age 68 or older, your body mass index should be between 23-30. Your Body mass index is 30.23 kg/m. If this is out of the aforementioned range listed, please consider follow up with your Primary Care Provider.  If you are age 81 or younger, your body mass index should be between 19-25. Your Body mass index is 30.23 kg/m. If this is out of the aformentioned range listed, please consider follow up with your Primary Care Provider.   We have sent the following medications to your pharmacy for you to pick up at your convenience: Prevacid 30 mg nightly. Bentyl 10 mg twice daily as needed.   Follow up as needed.

## 2020-01-06 DIAGNOSIS — H35013 Changes in retinal vascular appearance, bilateral: Secondary | ICD-10-CM | POA: Diagnosis not present

## 2020-01-06 DIAGNOSIS — H26493 Other secondary cataract, bilateral: Secondary | ICD-10-CM | POA: Diagnosis not present

## 2020-01-06 DIAGNOSIS — H26492 Other secondary cataract, left eye: Secondary | ICD-10-CM | POA: Diagnosis not present

## 2020-01-06 DIAGNOSIS — H40013 Open angle with borderline findings, low risk, bilateral: Secondary | ICD-10-CM | POA: Diagnosis not present

## 2020-01-06 DIAGNOSIS — H35363 Drusen (degenerative) of macula, bilateral: Secondary | ICD-10-CM | POA: Diagnosis not present

## 2020-02-13 ENCOUNTER — Encounter: Payer: Self-pay | Admitting: Internal Medicine

## 2020-02-13 ENCOUNTER — Other Ambulatory Visit: Payer: Self-pay | Admitting: Internal Medicine

## 2020-02-13 MED ORDER — BENZONATATE 200 MG PO CAPS: 200.0000 mg | ORAL_CAPSULE | Freq: Three times a day (TID) | ORAL | 0 refills | Status: DC | PRN

## 2020-05-18 DIAGNOSIS — H35373 Puckering of macula, bilateral: Secondary | ICD-10-CM | POA: Diagnosis not present

## 2020-05-18 DIAGNOSIS — H40013 Open angle with borderline findings, low risk, bilateral: Secondary | ICD-10-CM | POA: Diagnosis not present

## 2020-05-18 DIAGNOSIS — H35363 Drusen (degenerative) of macula, bilateral: Secondary | ICD-10-CM | POA: Diagnosis not present

## 2020-05-18 DIAGNOSIS — H26491 Other secondary cataract, right eye: Secondary | ICD-10-CM | POA: Diagnosis not present

## 2020-06-11 ENCOUNTER — Other Ambulatory Visit: Payer: Self-pay | Admitting: Internal Medicine

## 2020-06-27 ENCOUNTER — Other Ambulatory Visit: Payer: Self-pay | Admitting: Internal Medicine

## 2020-06-29 ENCOUNTER — Encounter: Payer: Self-pay | Admitting: Internal Medicine

## 2020-06-29 ENCOUNTER — Ambulatory Visit (INDEPENDENT_AMBULATORY_CARE_PROVIDER_SITE_OTHER): Payer: Medicare Other | Admitting: Internal Medicine

## 2020-06-29 ENCOUNTER — Other Ambulatory Visit: Payer: Self-pay

## 2020-06-29 VITALS — BP 144/88 | HR 46 | Temp 98.6°F | Ht 61.0 in | Wt 158.0 lb

## 2020-06-29 DIAGNOSIS — E782 Mixed hyperlipidemia: Secondary | ICD-10-CM

## 2020-06-29 DIAGNOSIS — R42 Dizziness and giddiness: Secondary | ICD-10-CM

## 2020-06-29 DIAGNOSIS — R7301 Impaired fasting glucose: Secondary | ICD-10-CM | POA: Diagnosis not present

## 2020-06-29 DIAGNOSIS — I10 Essential (primary) hypertension: Secondary | ICD-10-CM

## 2020-06-29 DIAGNOSIS — Z0000 Encounter for general adult medical examination without abnormal findings: Secondary | ICD-10-CM

## 2020-06-29 MED ORDER — BENZONATATE 200 MG PO CAPS: 200.0000 mg | ORAL_CAPSULE | Freq: Three times a day (TID) | ORAL | 3 refills | Status: DC | PRN

## 2020-06-29 MED ORDER — AMLODIPINE BESYLATE 10 MG PO TABS: 10.0000 mg | ORAL_TABLET | Freq: Every day | ORAL | 3 refills | Status: DC

## 2020-06-29 MED ORDER — MECLIZINE HCL 12.5 MG PO TABS: 12.5000 mg | ORAL_TABLET | Freq: Three times a day (TID) | ORAL | 0 refills | Status: DC | PRN

## 2020-06-29 NOTE — Assessment & Plan Note (Signed)
Checking lipid panel and adjust as needed. Not on meds currently.  

## 2020-06-29 NOTE — Assessment & Plan Note (Signed)
Increase amlodipine to 10 mg daily, BP mildly high here and at home per patient. Keep irbesartan 160 mg daily dosing. Checking CMP and adjust as needed.

## 2020-06-29 NOTE — Assessment & Plan Note (Signed)
Checking HgA1c and adjust as needed.  

## 2020-06-29 NOTE — Patient Instructions (Addendum)
You can get the shingrix vaccine against shingles at the pharmacy.   Health Maintenance, Female Adopting a healthy lifestyle and getting preventive care are important in promoting health and wellness. Ask your health care provider about:  The right schedule for you to have regular tests and exams.  Things you can do on your own to prevent diseases and keep yourself healthy. What should I know about diet, weight, and exercise? Eat a healthy diet   Eat a diet that includes plenty of vegetables, fruits, low-fat dairy products, and lean protein.  Do not eat a lot of foods that are high in solid fats, added sugars, or sodium. Maintain a healthy weight Body mass index (BMI) is used to identify weight problems. It estimates body fat based on height and weight. Your health care provider can help determine your BMI and help you achieve or maintain a healthy weight. Get regular exercise Get regular exercise. This is one of the most important things you can do for your health. Most adults should:  Exercise for at least 150 minutes each week. The exercise should increase your heart rate and make you sweat (moderate-intensity exercise).  Do strengthening exercises at least twice a week. This is in addition to the moderate-intensity exercise.  Spend less time sitting. Even light physical activity can be beneficial. Watch cholesterol and blood lipids Have your blood tested for lipids and cholesterol at 68 years of age, then have this test every 5 years. Have your cholesterol levels checked more often if:  Your lipid or cholesterol levels are high.  You are older than 68 years of age.  You are at high risk for heart disease. What should I know about cancer screening? Depending on your health history and family history, you may need to have cancer screening at various ages. This may include screening for:  Breast cancer.  Cervical cancer.  Colorectal cancer.  Skin cancer.  Lung  cancer. What should I know about heart disease, diabetes, and high blood pressure? Blood pressure and heart disease  High blood pressure causes heart disease and increases the risk of stroke. This is more likely to develop in people who have high blood pressure readings, are of African descent, or are overweight.  Have your blood pressure checked: ? Every 3-5 years if you are 3-76 years of age. ? Every year if you are 41 years old or older. Diabetes Have regular diabetes screenings. This checks your fasting blood sugar level. Have the screening done:  Once every three years after age 14 if you are at a normal weight and have a low risk for diabetes.  More often and at a younger age if you are overweight or have a high risk for diabetes. What should I know about preventing infection? Hepatitis B If you have a higher risk for hepatitis B, you should be screened for this virus. Talk with your health care provider to find out if you are at risk for hepatitis B infection. Hepatitis C Testing is recommended for:  Everyone born from [birth year] through 1964.  Anyone with known risk factors for hepatitis C. Sexually transmitted infections (STIs)  Get screened for STIs, including gonorrhea and chlamydia, if: ? You are sexually active and are younger than 68 years of age. ? You are older than 68 years of age and your health care provider tells you that you are at risk for this type of infection. ? Your sexual activity has changed since you were last screened, and you are  at increased risk for chlamydia or gonorrhea. Ask your health care provider if you are at risk.  Ask your health care provider about whether you are at high risk for HIV. Your health care provider may recommend a prescription medicine to help prevent HIV infection. If you choose to take medicine to prevent HIV, you should first get tested for HIV. You should then be tested every 3 months for as long as you are taking the  medicine. Pregnancy  If you are about to stop having your period (premenopausal) and you may become pregnant, seek counseling before you get pregnant.  Take 400 to 800 micrograms (mcg) of folic acid every day if you become pregnant.  Ask for birth control (contraception) if you want to prevent pregnancy. Osteoporosis and menopause Osteoporosis is a disease in which the bones lose minerals and strength with aging. This can result in bone fractures. If you are 48 years old or older, or if you are at risk for osteoporosis and fractures, ask your health care provider if you should:  Be screened for bone loss.  Take a calcium or vitamin D supplement to lower your risk of fractures.  Be given hormone replacement therapy (HRT) to treat symptoms of menopause. Follow these instructions at home: Lifestyle  Do not use any products that contain nicotine or tobacco, such as cigarettes, e-cigarettes, and chewing tobacco. If you need help quitting, ask your health care provider.  Do not use street drugs.  Do not share needles.  Ask your health care provider for help if you need support or information about quitting drugs. Alcohol use  Do not drink alcohol if: ? Your health care provider tells you not to drink. ? You are pregnant, may be pregnant, or are planning to become pregnant.  If you drink alcohol: ? Limit how much you use to 0-1 drink a day. ? Limit intake if you are breastfeeding.  Be aware of how much alcohol is in your drink. In the [place] one drink equals one 12 oz bottle of beer (355 mL), one 5 oz glass of wine (148 mL), or one 1 oz glass of hard liquor (44 mL). General instructions  Schedule regular health, dental, and eye exams.  Stay current with your vaccines.  Tell your health care provider if: ? You often feel depressed. ? You have ever been abused or do not feel safe at home. Summary  Adopting a healthy lifestyle and getting preventive care are important in  promoting health and wellness.  Follow your health care provider's instructions about healthy diet, exercising, and getting tested or screened for diseases.  Follow your health care provider's instructions on monitoring your cholesterol and blood pressure. This information is not intended to replace advice given to you by your health care provider. Make sure you discuss any questions you have with your health care provider. Document Revised: 11/07/2018 Document Reviewed: 11/07/2018 Elsevier Patient Education  2020 [employer].

## 2020-06-29 NOTE — Progress Notes (Signed)
Subjective:   Patient ID: Gina Jefferson, female    DOB: [birth year], 69 y.o.   MRN: [id number]  HPI Here for medicare wellness and physical, no new complaints. Please see A/P for status and treatment of chronic medical problems.   Diet: heart healthy Physical activity: sedentary Depression/mood screen: negative Hearing: intact to whispered voice Visual acuity: grossly normal, s/p cataract bilateral, performs annual eye exam  ADLs: capable Fall risk: none Home safety: good Cognitive evaluation: intact to orientation, naming, recall and repetition EOL planning: adv directives discussed    Office Visit from 06/29/2020 in [place] at [facility] Total Score 0        Clinical Support from 02/23/2018 in [place]  PHQ-9 Total Score 3     I have personally reviewed and have noted 1. The patient's medical and social history - reviewed today no changes 2. Their use of alcohol, tobacco or illicit drugs 3. Their current medications and supplements 4. The patient's functional ability including ADL's, fall risks, home safety risks and hearing or visual impairment. 5. Diet and physical activities 6. Evidence for depression or mood disorders 7. Care team reviewed and updated  Patient Care Team: Hoyt Koch, MD as PCP - General (Internal Medicine) Gatha Mayer, MD as Consulting Physician (Gastroenterology) Dian Queen, MD as Consulting Physician (Obstetrics and Gynecology) Almedia Balls, MD as Consulting Physician (Orthopedic Surgery) Jacelyn Pi, MD (Endocrinology) Past Medical History:  Diagnosis Date  . Anxiety   . Arthritis    knee, c-spine  . Carpal tunnel syndrome of right wrist 10/2011   s/p surgical release  . Colon polyp   . GERD   . Hx of adenomatous polyp of colon 2010  . Hyperlipidemia   . HYPERTENSION    under control; has been on med. x 15 yrs.  . Seasonal allergies    Past Surgical History:    Procedure Laterality Date  . ABDOMINAL HYSTERECTOMY     complete  . APPENDECTOMY    . BREAST REDUCTION SURGERY  2016  . CARPAL TUNNEL RELEASE  11/24/2011   Procedure: CARPAL TUNNEL RELEASE;  Surgeon: Garald Balding, MD;  Location: [facility];  Service: Orthopedics;  Laterality: Right;  CARPAL TUNNEL REL ON RIGHT   . CATARACT EXTRACTION, BILATERAL Bilateral 2018  . CESAREAN SECTION    . CHOLECYSTECTOMY    . COLONOSCOPY    . OOPHORECTOMY    . OVARIAN CYST REMOVAL    . POLYPECTOMY    . TONSILLECTOMY    . WISDOM TOOTH EXTRACTION     Family History  Problem Relation Age of Onset  . Diabetes Mother   . Stroke Mother   . Heart disease Mother   . Hyperlipidemia Mother   . Hypertension Mother   . Stroke Father   . Alzheimer's disease Father   . Mental illness Father   . Hypertension Father   . Hyperlipidemia Father   . Cancer Maternal Grandfather        stomach cancer  . Colon cancer Neg Hx     Review of Systems  Constitutional: Negative.   HENT: Negative.   Eyes: Negative.   Respiratory: Negative for cough, chest tightness and shortness of breath.   Cardiovascular: Negative for chest pain, palpitations and leg swelling.  Gastrointestinal: Negative for abdominal distention, abdominal pain, constipation, diarrhea, nausea and vomiting.  Musculoskeletal: Negative.   Skin: Negative.   Neurological: Negative.   Psychiatric/Behavioral: Negative.  Objective:  Physical Exam Constitutional:      Appearance: She is well-developed.  HENT:     Head: Normocephalic and atraumatic.  Cardiovascular:     Rate and Rhythm: Normal rate and regular rhythm.  Pulmonary:     Effort: Pulmonary effort is normal. No respiratory distress.     Breath sounds: Normal breath sounds. No wheezing or rales.  Abdominal:     General: Bowel sounds are normal. There is no distension.     Palpations: Abdomen is soft.     Tenderness: There is no abdominal tenderness. There is no  rebound.  Musculoskeletal:     Cervical back: Normal range of motion.  Skin:    General: Skin is warm and dry.  Neurological:     Mental Status: She is alert and oriented to person, place, and time.     Coordination: Coordination normal.     Vitals:   06/29/20 1015  BP: (!) 144/88  Pulse: 46  Temp: 98.6 F (37 C)  TempSrc: Oral  SpO2: 96%  Weight: 158 lb (71.7 kg)  Height: 5\' 1"  (1.549 m)   This visit occurred during the SARS-CoV-2 public health emergency.  Safety protocols were in place, including screening questions prior to the visit, additional usage of staff PPE, and extensive cleaning of exam room while observing appropriate contact time as indicated for disinfecting solutions.   Assessment & Plan:

## 2020-06-29 NOTE — Assessment & Plan Note (Signed)
Rare refill meclizine for prn only.

## 2020-06-29 NOTE — Assessment & Plan Note (Signed)
Flu shot yearly. Covid-19 complete. Pneumonia complete. Shingrix counseled. Tetanus due 2026. Colonoscopy due 2026. Mammogram due Nov 2021 scheduled, pap smear aged out and dexa due 2023. Counseled about sun safety and mole surveillance. Counseled about the dangers of distracted driving. Given 10 year screening recommendations.

## 2020-06-30 LAB — CBC
HCT: 43.3 % (ref 35.0–45.0)
Hemoglobin: 14.3 g/dL (ref 11.7–15.5)
MCH: 30.9 pg (ref 27.0–33.0)
MCHC: 33 g/dL (ref 32.0–36.0)
MCV: 93.5 fL (ref 80.0–100.0)
MPV: 10.9 fL (ref 7.5–12.5)
Platelets: 228 10*3/uL (ref 140–400)
RBC: 4.63 10*6/uL (ref 3.80–5.10)
RDW: 12.4 % (ref 11.0–15.0)
WBC: 5.3 10*3/uL (ref 3.8–10.8)

## 2020-06-30 LAB — COMPREHENSIVE METABOLIC PANEL
AG Ratio: 1.3 (calc) (ref 1.0–2.5)
ALT: 27 U/L (ref 6–29)
AST: 26 U/L (ref 10–35)
Albumin: 4.2 g/dL (ref 3.6–5.1)
Alkaline phosphatase (APISO): 90 U/L (ref 37–153)
BUN: 12 mg/dL (ref 7–25)
CO2: 31 mmol/L (ref 20–32)
Calcium: 9.7 mg/dL (ref 8.6–10.4)
Chloride: 104 mmol/L (ref 98–110)
Creat: 0.79 mg/dL (ref 0.50–0.99)
Globulin: 3.2 g/dL (calc) (ref 1.9–3.7)
Glucose, Bld: 96 mg/dL (ref 65–99)
Potassium: 4 mmol/L (ref 3.5–5.3)
Sodium: 141 mmol/L (ref 135–146)
Total Bilirubin: 0.6 mg/dL (ref 0.2–1.2)
Total Protein: 7.4 g/dL (ref 6.1–8.1)

## 2020-06-30 LAB — LIPID PANEL
Cholesterol: 284 mg/dL — ABNORMAL HIGH (ref <200)
HDL: 52 mg/dL (ref >=50)
LDL Cholesterol (Calc): 201 mg/dL (calc) — ABNORMAL HIGH
Non-HDL Cholesterol (Calc): 232 mg/dL (calc) — ABNORMAL HIGH (ref <130)
Total CHOL/HDL Ratio: 5.5 (calc) — ABNORMAL HIGH (ref <5.0)
Triglycerides: 148 mg/dL (ref <150)

## 2020-06-30 LAB — HEMOGLOBIN A1C
Hgb A1c MFr Bld: 5.9 % of total Hgb — ABNORMAL HIGH (ref <5.7)
Mean Plasma Glucose: 123 (calc)
eAG (mmol/L): 6.8 (calc)

## 2020-07-01 DIAGNOSIS — M48061 Spinal stenosis, lumbar region without neurogenic claudication: Secondary | ICD-10-CM | POA: Diagnosis not present

## 2020-07-01 DIAGNOSIS — M160 Bilateral primary osteoarthritis of hip: Secondary | ICD-10-CM | POA: Diagnosis not present

## 2020-07-05 ENCOUNTER — Other Ambulatory Visit: Payer: Self-pay | Admitting: Internal Medicine

## 2020-07-06 ENCOUNTER — Encounter: Payer: Self-pay | Admitting: Internal Medicine

## 2020-07-06 ENCOUNTER — Other Ambulatory Visit: Payer: Self-pay | Admitting: Internal Medicine

## 2020-07-06 DIAGNOSIS — H35373 Puckering of macula, bilateral: Secondary | ICD-10-CM | POA: Diagnosis not present

## 2020-07-06 DIAGNOSIS — H35363 Drusen (degenerative) of macula, bilateral: Secondary | ICD-10-CM | POA: Diagnosis not present

## 2020-07-06 DIAGNOSIS — M545 Low back pain: Secondary | ICD-10-CM | POA: Diagnosis not present

## 2020-07-06 DIAGNOSIS — H40013 Open angle with borderline findings, low risk, bilateral: Secondary | ICD-10-CM | POA: Diagnosis not present

## 2020-07-06 DIAGNOSIS — H26491 Other secondary cataract, right eye: Secondary | ICD-10-CM | POA: Diagnosis not present

## 2020-07-06 NOTE — Telephone Encounter (Signed)
Shouldn't need refill was just sent in with refills on 06/29/20

## 2020-07-07 DIAGNOSIS — M47816 Spondylosis without myelopathy or radiculopathy, lumbar region: Secondary | ICD-10-CM | POA: Diagnosis not present

## 2020-07-07 DIAGNOSIS — M545 Low back pain: Secondary | ICD-10-CM | POA: Diagnosis not present

## 2020-07-07 DIAGNOSIS — M160 Bilateral primary osteoarthritis of hip: Secondary | ICD-10-CM | POA: Diagnosis not present

## 2020-07-07 MED ORDER — BENZONATATE 200 MG PO CAPS: 200.0000 mg | ORAL_CAPSULE | Freq: Three times a day (TID) | ORAL | 3 refills | Status: DC | PRN

## 2020-07-08 ENCOUNTER — Other Ambulatory Visit: Payer: Self-pay

## 2020-07-08 MED ORDER — ONDANSETRON HCL 4 MG PO TABS: ORAL_TABLET | ORAL | 1 refills | Status: DC

## 2020-07-09 ENCOUNTER — Other Ambulatory Visit: Payer: Self-pay | Admitting: Internal Medicine

## 2020-07-09 MED ORDER — MECLIZINE HCL 12.5 MG PO TABS: 12.5000 mg | ORAL_TABLET | Freq: Three times a day (TID) | ORAL | 0 refills | Status: DC | PRN

## 2020-07-27 DIAGNOSIS — M47816 Spondylosis without myelopathy or radiculopathy, lumbar region: Secondary | ICD-10-CM | POA: Diagnosis not present

## 2020-07-27 DIAGNOSIS — M545 Low back pain: Secondary | ICD-10-CM | POA: Diagnosis not present

## 2020-08-20 DIAGNOSIS — M47816 Spondylosis without myelopathy or radiculopathy, lumbar region: Secondary | ICD-10-CM | POA: Diagnosis not present

## 2020-08-20 DIAGNOSIS — M545 Low back pain: Secondary | ICD-10-CM | POA: Diagnosis not present

## 2020-08-27 ENCOUNTER — Other Ambulatory Visit: Payer: Self-pay

## 2020-08-27 ENCOUNTER — Ambulatory Visit (INDEPENDENT_AMBULATORY_CARE_PROVIDER_SITE_OTHER): Payer: Medicare Other

## 2020-08-27 DIAGNOSIS — Z23 Encounter for immunization: Secondary | ICD-10-CM | POA: Diagnosis not present

## 2020-09-07 ENCOUNTER — Other Ambulatory Visit: Payer: Self-pay | Admitting: Internal Medicine

## 2020-09-09 ENCOUNTER — Other Ambulatory Visit: Payer: Self-pay

## 2020-09-09 MED ORDER — LANSOPRAZOLE 30 MG PO CPDR
30.0000 mg | DELAYED_RELEASE_CAPSULE | Freq: Every day | ORAL | 3 refills | Status: DC
Start: 2020-09-09 — End: 2020-12-18

## 2020-09-25 DIAGNOSIS — M25551 Pain in right hip: Secondary | ICD-10-CM | POA: Diagnosis not present

## 2020-10-02 ENCOUNTER — Other Ambulatory Visit: Payer: Self-pay | Admitting: Internal Medicine

## 2020-10-02 MED ORDER — ALPRAZOLAM 0.25 MG PO TABS: 0.2500 mg | ORAL_TABLET | Freq: Every day | ORAL | 0 refills | Status: DC | PRN

## 2020-10-29 DIAGNOSIS — M25551 Pain in right hip: Secondary | ICD-10-CM | POA: Diagnosis not present

## 2020-10-29 DIAGNOSIS — M47816 Spondylosis without myelopathy or radiculopathy, lumbar region: Secondary | ICD-10-CM | POA: Diagnosis not present

## 2020-10-30 ENCOUNTER — Telehealth: Payer: Self-pay | Admitting: Internal Medicine

## 2020-10-30 NOTE — Progress Notes (Signed)
  Chronic Care Management   Outreach Note  10/30/2020 Name: Gina Jefferson MRN: [id number] DOB: 1951/01/04  Referred by: Hoyt Koch, MD Reason for referral : No chief complaint on file.   A second unsuccessful telephone outreach was attempted today. The patient was referred to pharmacist for assistance with care management and care coordination.  Follow Up Plan:   Carley Perdue [employer] Scheduler

## 2020-10-30 NOTE — Progress Notes (Signed)
Patient requested to call home phone number.    Chronic Care Management   Note  10/30/2020 Name: Gina Jefferson MRN: [id number] DOB: Oct 29, 1951  Gina Jefferson is a 69 y.o. year old female who is a primary care patient of Hoyt Koch, MD. I reached out to Harvey S Melberg by phone today in response to a referral sent by Ms. Bexley S Daris's PCP, Hoyt Koch, MD.   Gina Jefferson was given information about Chronic Care Management services today including:  1. CCM service includes personalized support from designated clinical staff supervised by her physician, including individualized plan of care and coordination with other care providers 2. 24/7 contact phone numbers for assistance for urgent and routine care needs. 3. Service will only be billed when office clinical staff spend 20 minutes or more in a month to coordinate care. 4. Only one practitioner may furnish and bill the service in a calendar month. 5. The patient may stop CCM services at any time (effective at the end of the month) by phone call to the office staff.   Patient agreed to services and verbal consent obtained.   Follow up plan:   Carley Perdue [employer] Scheduler

## 2020-10-30 NOTE — Progress Notes (Signed)
  Chronic Care Management   Outreach Note  10/30/2020 Name: Kamaiya S Zunker MRN: [id number] DOB: [birth year]  Referred by: Hoyt Koch, MD Reason for referral : No chief complaint on file.   An unsuccessful telephone outreach was attempted today. The patient was referred to the pharmacist for assistance with care management and care coordination.   Follow Up Plan:   Carley Perdue [employer] Scheduler

## 2020-11-05 ENCOUNTER — Encounter: Payer: Self-pay | Admitting: Internal Medicine

## 2020-11-05 DIAGNOSIS — H40013 Open angle with borderline findings, low risk, bilateral: Secondary | ICD-10-CM | POA: Diagnosis not present

## 2020-11-26 DIAGNOSIS — Z1231 Encounter for screening mammogram for malignant neoplasm of breast: Secondary | ICD-10-CM | POA: Diagnosis not present

## 2020-12-10 ENCOUNTER — Telehealth: Payer: Medicare Other

## 2020-12-10 DIAGNOSIS — M1712 Unilateral primary osteoarthritis, left knee: Secondary | ICD-10-CM | POA: Diagnosis not present

## 2020-12-10 DIAGNOSIS — M76822 Posterior tibial tendinitis, left leg: Secondary | ICD-10-CM | POA: Diagnosis not present

## 2020-12-10 NOTE — Chronic Care Management (AMB) (Deleted)
Chronic Care Management Pharmacy Note  12/10/2020 Name:  Gina Jefferson MRN:  [id number] DOB:  [birth year]  Subjective: Gina Jefferson is an 69 y.o. year old female who is a primary patient of Hoyt Koch, MD.  The CCM team was consulted for assistance with disease management and care coordination needs.    {CCMTELEPHONEFACETOFACE:21091510} for {CCMINITIALFOLLOWUPCHOICE:21091511} in response to provider referral for pharmacy case management and/or care coordination services.   Consent to Services:  {CCMCONSENTOPTIONS:25074}   Recent office visits: 06/29/20 Dr Sharlet Salina OV: chronic f/u, refilled meclizine prn, increased amlodipine to 10 mg due to BP above goal.  Recent consult visits: 08/20/20 Dr Ron Agee (phys med/rehab): F/u for low back pain  Objective:  Lab Results  Component Value Date   CREATININE 0.79 06/29/2020   BUN 12 06/29/2020   GFR 111.28 02/23/2018   GFRNONAA >90 11/23/2011   GFRAA >90 11/23/2011   NA 141 06/29/2020   K 4.0 06/29/2020   CALCIUM 9.7 06/29/2020   CO2 31 06/29/2020    Lab Results  Component Value Date/Time   HGBA1C 5.9 (H) 06/29/2020 11:01 AM   HGBA1C 5.8 05/16/2019 12:00 AM   HGBA1C 6.0 02/21/2017 02:55 PM   GFR 111.28 02/23/2018 10:23 AM   GFR 115.54 02/21/2017 02:55 PM   MICROALBUR 0.1 01/21/2014 10:43 AM    Last diabetic Eye exam:  Lab Results  Component Value Date/Time   HMDIABEYEEXA No Retinopathy 03/16/2018 12:00 AM    Last diabetic Foot exam:  Lab Results  Component Value Date/Time   HMDIABFOOTEX  05/16/2013 12:00 AM    Done @ Dr. Herbert Deaner No diabetic retinopathy, no diabetic macular edema OD 20/25 OS 20/25     Lab Results  Component Value Date   CHOL 284 (H) 06/29/2020   HDL 52 06/29/2020   LDLCALC 201 (H) 06/29/2020   LDLDIRECT 175.0 02/21/2017   TRIG 148 06/29/2020   CHOLHDL 5.5 (H) 06/29/2020    Hepatic Function Latest Ref Rng & Units 06/29/2020 05/16/2019 02/23/2018  Total Protein 6.1 - 8.1 g/dL 7.4 - 7.7   Albumin 3.5 - 5.2 g/dL - - 4.0  AST 10 - 35 U/L 26 - 23  ALT 6 - 29 U/L 27 - 22  Alk Phosphatase 25 - 125 - 91 75  Total Bilirubin 0.2 - 1.2 mg/dL 0.6 - 0.9  Bilirubin, Direct 0.0 - 0.3 mg/dL - - -    Lab Results  Component Value Date/Time   TSH 0.81 06/22/2018 02:03 PM   TSH 1.51 02/18/2016 09:05 AM   FREET4 0.86 07/17/2009 02:16 PM   FREET4 0.8 09/12/2008 11:37 AM    CBC Latest Ref Rng & Units 06/29/2020 05/16/2019 02/23/2018  WBC 3.8 - 10.8 Thousand/uL 5.3 5.4 6.3  Hemoglobin 11.7 - 15.5 g/dL 14.3 13.9 13.9  Hematocrit 35.0 - 45.0 % 43.3 45 41.6  Platelets 140 - 400 Thousand/uL 228 193 226.0    Lab Results  Component Value Date/Time   VD25OH 15.97 (L) 02/13/2017 09:41 AM    Clinical ASCVD: No  The 10-year ASCVD risk score [name], et al., 2013) is: 27.4%   Values used to calculate the score:     Age: 57 years     Sex: Female     Is Non-Hispanic African American: Yes     Diabetic: Yes     Tobacco smoker: No     Systolic Blood Pressure: 973 mmHg     Is BP treated: Yes     HDL Cholesterol: 52 mg/dL  Total Cholesterol: 284 mg/dL    Depression screen [facility] 2/9 06/29/2020 06/28/2019 02/23/2018  Decreased Interest 0 0 0  Down, Depressed, Hopeless 0 0 0  PHQ - 2 Score 0 0 0  Altered sleeping - - 3  Tired, decreased energy - - 0  Change in appetite - - 0  Feeling bad or failure about yourself  - - 0  Trouble concentrating - - 0  Moving slowly or fidgety/restless - - 0  Suicidal thoughts - - 0  PHQ-9 Score - - 3  Difficult doing work/chores - - Not difficult at all   No flowsheet data found.  BP Readings from Last 3 Encounters:  06/29/20 (!) 144/88  12/25/19 (!) 144/86  06/28/19 140/82   Pulse Readings from Last 3 Encounters:  06/29/20 46  12/25/19 96  06/28/19 86   Wt Readings from Last 3 Encounters:  06/29/20 158 lb (71.7 kg)  12/25/19 160 lb (72.6 kg)  06/28/19 163 lb (73.9 kg)    Assessment/Interventions: Review of patient past medical history,  allergies, medications, health status, including review of consultants reports, laboratory and other test data, was performed as part of comprehensive evaluation and provision of chronic care management services.   SDOH:  (Social Determinants of Health) assessments and interventions performed:    CCM Care Plan  Allergies  Allergen Reactions  . Clindamycin/Lincomycin Rash  . Ciprofloxacin Rash  . Penicillins Rash  . Statins Other (See Comments)    Muscle aches  . Metformin Diarrhea and Other (See Comments)    Vomiting     Medications Reviewed Today    Reviewed by Hoyt Koch, MD (Physician) on 06/29/20 at 1043  Med List Status: <None>  Medication Order Taking? Sig Documenting Provider Last Dose Status Informant  ALPRAZolam (XANAX) 0.25 MG tablet [id number] Yes Take 1 tablet (0.25 mg total) by mouth daily as needed for anxiety. Hoyt Koch, MD Taking Active   amLODipine (NORVASC) 5 MG tablet [id number] Yes Take 1 tablet (5 mg total) by mouth daily. Hoyt Koch, MD Taking Active   Ascorbic Acid (VITAMIN C) 500 MG CAPS [id number] Yes Take 500 mg by mouth daily. [provider] Taking Active   benzonatate (TESSALON) 200 MG capsule [id number] Yes Take 1 capsule (200 mg total) by mouth 3 (three) times daily as needed for cough. Hoyt Koch, MD Taking Active   Biotin 1000 MCG tablet [id number] Yes Take 1,000 mcg by mouth daily.   [provider] Taking Active   cetirizine (ZYRTEC) 10 MG tablet [id number] Yes Take 10 mg by mouth daily.   [provider] Taking Active   Cholecalciferol (VITAMIN D PO) [id number] Yes Take 5,000 Units by mouth daily. [provider] Taking Active Self  diclofenac (VOLTAREN) 75 MG EC tablet [id number] Yes Take 75 mg by mouth 1 day or 1 dose. [provider] Taking Active Self  Diclofenac Sodium 3 % GEL [id number] Yes Reported on 05/05/2016 [provider] Taking Active            Med Note  Serita Sheller Pryor Ochoa*   Thu May 05, 2016  9:31 AM) Received from: External Pharmacy  dicyclomine (BENTYL) 10 MG capsule [id number] Yes Take 1 capsule (10 mg total) by mouth 2 (two) times daily as needed for spasms. Esterwood, Amy S, PA-C Taking Active   Garlic 3976 MG CAPS [id number] Yes Take by mouth daily. [provider] Taking Active   lansoprazole (PREVACID) 30 MG capsule [id number] Yes Take 1 capsule (  30 mg total) by mouth at bedtime. Esterwood, Amy S, PA-C Taking Active   ondansetron (ZOFRAN) 8 MG tablet [id number] Yes Take 1 tablet (8 mg total) by mouth every 8 (eight) hours as needed for nausea or vomiting. Hoyt Koch, MD Taking Active   valsartan (DIOVAN) 160 MG tablet [id number] Yes Take 1 tablet (160 mg total) by mouth daily. Hoyt Koch, MD Taking Active           Patient Active Problem List   Diagnosis Date Noted  . Neck pain on left side 05/21/2018  . Vertigo 12/11/2015  . Routine general medical examination at a health care facility 02/12/2015  . Impaired fasting blood sugar 01/21/2011  . Allergic rhinitis 09/24/2010  . Hyperlipidemia 06/23/2009  . Essential hypertension 06/23/2009  . 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   Immunization History  Administered Date(s) Administered  . Fluad Quad(high Dose 65+) 08/21/2019, 08/27/2020  . Influenza Split 08/28/2012  . Influenza Whole 08/08/2005, 08/19/2008, 09/28/2009, 09/28/2010  . Influenza,inj,Quad PF,6+ Mos 08/28/2013, 09/11/2014, 09/06/2016  . Influenza-Unspecified 08/12/2013, 09/12/2015, 09/07/2017, 09/11/2018  . PFIZER SARS-COV-2 Vaccination 12/07/2019, 12/26/2019  . Pneumococcal Conjugate-13 02/21/2017  . Pneumococcal Polysaccharide-23 01/16/2006, 02/23/2018  . Td 01/17/2005  . Tdap 02/27/2015  . Zoster 01/15/2013     Conditions to be addressed/monitored:  {CCM ASSESSMENT DZ OPTIONS:25047}  There are no care plans to display for this patient.   Medication Assistance:  {MEDASSISTANCEINFO:25044}  Patient's preferred pharmacy is:  [facility] [place], [address] [place] [address] Phone: [phone] Fax: [phone]  [place], [address], [address] [place], [address] [place] [address] Phone: [phone] Fax: [phone]  Uses pill box? {Yes or If no, why not?:20788} Pt endorses ***% compliance  We discussed: {Pharmacy options:24294}  Plan to: {US Pharmacy FM[facility]:37543}    Follow Up:  {FOLLOWUP:24991}  Plan: {CM FOLLOW UP KGOV:70340}  Charlene Brooke, PharmD, BCACP Clinical Pharmacist [facility] at [facility] [phone]    Current Barriers:  . {PHARMCACYBARRIERS:21091514}  Pharmacist Clinical Goal(s):  Marland Kitchen Over the next *** days, patient will {PHARMACYGOALCHOICES:25079} through collaboration with PharmD and provider.   Interventions: . 1:1 collaboration with Hoyt Koch, MD regarding development and update of comprehensive plan of care as evidenced by provider attestation and co-signature . Inter-disciplinary care team collaboration (see longitudinal plan of care) . Comprehensive medication review performed; medication list updated in electronic medical record  Hypertension (BP goal <140/90) -{CHL Controlled/Uncontrolled:920-726-1252} -Current treatment: . Valsartan 160 mg daily . Amlodipine 10 mg daily -Medications previously tried: n/a -Current home readings: *** -Current dietary habits: *** -Current exercise habits: *** -{ACTIONS;DENIES/REPORTS:21021675::"Denies"} hypotensive/hypertensive symptoms -{pharmacyintervention:24918}  Hyperlipidemia: (LDL goal < ***) -uncontrolled - ASCVD risk high (27%) -Current treatment: . No medication -Medications previously tried: "statins" - muscle aches -Current dietary patterns: *** -Current exercise habits:  *** -{pharmacyintervention:24918}  GERD / GI upset (Goal ***) -{CHL Controlled/Uncontrolled:920-726-1252} -Current treatment  . Lansoprazole 30 mg HS . Dicyclomine 10 mg BID prn . Ondansetron 4 mg PRN -Medications previously tried: ***  -{PHARMACYINTERVENTION:21091513}  Anxiety/Insomnia (Goal: *** -{CHL Controlled/Uncontrolled:920-726-1252} -Current treatment: . Alprazolam 0.25 mg daily PRN (#10) -Medications previously tried/failed: sertraline, trazodone, amitriptyline, ramelteon, zolpidem  -PHQ9: 0 -GAD: not on file -Connected with *** for mental health support -{pharmacyintervention:24918}  Neck pain (Goal ***) -{CHL Controlled/Uncontrolled:920-726-1252} -Current treatment  . Diclofenac 75 mg . Voltaren gel PRN -Medications previously  tried: ***  -{PHARMACYINTERVENTION:21091513}  Health maintenance (Goal ***) -{CHL Controlled/Uncontrolled:580-002-1788} -Current treatment  . Vitamin C 500 mg daily . Biotin 1000 mcg daily . Vitamin D 5000 IU daily . Garlic 2277 mg daily . Cetirizine 10 mg daily . Benzonatate 200 mg TID prn . Meclizine 12.5 mg PRN -Medications previously tried: ***  -{PHARMACYINTERVENTION:21091513}  Patient Goals/Self-Care Activities . Over the next *** days, patient will:  {PHARMACYPATIENTGOALS:25081}  Follow Up Plan: {CM FOLLOW UP PLAN:25073} ***

## 2020-12-18 ENCOUNTER — Other Ambulatory Visit: Payer: Self-pay | Admitting: Physician Assistant

## 2020-12-18 ENCOUNTER — Other Ambulatory Visit: Payer: Self-pay

## 2020-12-18 MED ORDER — LANSOPRAZOLE 30 MG PO CPDR: DELAYED_RELEASE_CAPSULE | ORAL | 6 refills | Status: DC

## 2020-12-28 ENCOUNTER — Ambulatory Visit: Payer: Medicare Other | Admitting: Pharmacist

## 2020-12-28 ENCOUNTER — Telehealth: Payer: Self-pay | Admitting: Pharmacist

## 2020-12-28 ENCOUNTER — Other Ambulatory Visit: Payer: Self-pay

## 2020-12-28 DIAGNOSIS — F5101 Primary insomnia: Secondary | ICD-10-CM

## 2020-12-28 DIAGNOSIS — K219 Gastro-esophageal reflux disease without esophagitis: Secondary | ICD-10-CM

## 2020-12-28 DIAGNOSIS — I10 Essential (primary) hypertension: Secondary | ICD-10-CM

## 2020-12-28 DIAGNOSIS — E782 Mixed hyperlipidemia: Secondary | ICD-10-CM

## 2020-12-28 DIAGNOSIS — M160 Bilateral primary osteoarthritis of hip: Secondary | ICD-10-CM

## 2020-12-28 NOTE — Patient Instructions (Addendum)
Visit Information  Phone number for Pharmacist: [phone]  Thank you for meeting with me to discuss your medications! I look forward to working with you to achieve your health care goals. Below is a summary of what we talked about during the visit:  Goals Addressed            This Visit's Progress   . Track and Manage My Blood Pressure-Hypertension       Timeframe:  Long-Range Goal Priority:  High Start Date:      12/28/20                       Expected End Date:    08/17/21                 - check blood pressure 3 times per week - choose a place to take my blood pressure (home, clinic or office, retail store) - write blood pressure results in a log or diary  -monitor pulse and monitor for any symptoms related to bradycardia - lightheadedness, dizziness, fatigue, syncope   Why is this important?    You won't feel high blood pressure, but it can still hurt your blood vessels.   High blood pressure can cause heart or kidney problems. It can also cause a stroke.   Making lifestyle changes like losing a little weight or eating less salt will help.   Checking your blood pressure at home and at different times of the day can help to control blood pressure.   If the doctor prescribes medicine remember to take it the way the doctor ordered.   Call the office if you cannot afford the medicine or if there are questions about it.         Patient Care Plan: CCM Pharmacy Care Plan    Problem Identified: HTN, HLD, GERD, Osteoarthritis, Anxiety   Priority: High    Goal: Patient-Specific Goal   Start Date: 12/28/2020  Expected End Date: 09/27/2021  This Visit's Progress: On track  Priority: High  Note:   Current Barriers:  . Unable to independently monitor therapeutic efficacy . Unable to achieve control of cholesterol   Pharmacist Clinical Goal(s):  Marland Kitchen Over the next 270 days, patient will achieve adherence to monitoring guidelines and medication adherence to achieve  therapeutic efficacy . achieve control of LDL as evidenced by laboratory improvement through collaboration with PharmD and provider.   Interventions: . 1:1 collaboration with Hoyt Koch, MD regarding development and update of comprehensive plan of care as evidenced by provider attestation and co-signature . Inter-disciplinary care team collaboration (see longitudinal plan of care) . Comprehensive medication review performed; medication list updated in electronic medical record  Hypertension (BP goal <130/80) -controlled -Current treatment: . Valsartan 160 mg daily . Amlodipine 10 mg daily -Medications previously tried: n/a -Current home readings: 118/62, pulse 40s-50s -Denies hypotensive/hypertensive symptoms -Discussed low pulse at home, pt reports has been an issues since the summer, however she denies any symptoms related to this. Pulse was 46 at last PCP visit and no interventions noted. As long as she is asymptomatic she may keep regular annual follow up with PCP. Advised to contact PCP earlier if she experiences lightheadedness, dizziness, extreme fatigue -Recommend to continue current medication  Hyperlipidemia: (LDL goal < 130) -uncontrolled - ASCVD risk high (27%). Pt has tried and failed multiple statins and is unwilling to take cholesterol medication at this time -Current treatment: . No medication -Medications previously tried: "statins" - muscle aches;  atorvastatin, simvastatin, rosuvastatin, pravastatin, ezetimibe -Current dietary patterns: avoids high-cholesterol foods -Current exercise habits: Wii exercies, gardening;  -Educated on Cholesterol goals;  Benefits of statin for ASCVD risk reduction; Importance of limiting foods high in cholesterol; Exercise goal of 150 minutes per week; -Counseled on diet and exercise extensively  GERD / GI upset (Goal: manage symptoms) -controlled - per patient report -Current treatment  . Lansoprazole 30 mg  HS . Dicyclomine 10 mg BID prn  . Ondansetron 4 mg PRN -Recommended to continue current medications  Anxiety/Insomnia (Goal: manage symptoms) -controlled - per pt report. She lost her son 11 years ago and certain times of year are harder than others, she very rarely needs medication -Current treatment: . Alprazolam 0.25 mg daily PRN (#10) -Medications previously tried/failed: sertraline, trazodone, amitriptyline, ramelteon, zolpidem  -PHQ9: 0 -GAD: not on file -Connected with PCP for mental health support -Recommended to continue current medication  Arthritis / Neck pain (Goal: manage symptoms) -controlled - per pt report, much improved since cortisone injection 2 weeks ago -Current treatment  . Diclofenac 75 mg PRN - usually once daily . Voltaren gel PRN . Gabapentin 100 mg HS x 2 wks (temporary from ortho) -Recommended to continue current medication  Health maintenance -Vaccine gaps: none -Current treatment  . Vitamin C 500 mg daily . Biotin 1000 mcg daily . Vitamin D 5000 IU daily . Garlic 0626 mg daily . Cetirizine 10 mg daily . Benzonatate 200 mg TID prn . Meclizine 12.5 mg PRN -Patient is satisfied with current regimen and denies issues -Recommended to continue current medications  Patient Goals/Self-Care Activities . Over the next 270 days, patient will:  take medications as prescribed, target a minimum of 150 minutes of moderate intensity exercise weekly and engage in dietary modifications by reducing cholesterol in diet  Follow Up Plan: Telephone follow up appointment with care management team member scheduled for:  9 months      Gina Jefferson was given information about Chronic Care Management services today including:  1. CCM service includes personalized support from designated clinical staff supervised by her physician, including individualized plan of care and coordination with other care providers 2. 24/7 contact phone numbers for assistance for urgent and  routine care needs. 3. Standard insurance, coinsurance, copays and deductibles apply for chronic care management only during months in which we provide at least 20 minutes of these services. Most insurances cover these services at 100%, however patients may be responsible for any copay, coinsurance and/or deductible if applicable. This service may help you avoid the need for more expensive face-to-face services. 4. Only one practitioner may furnish and bill the service in a calendar month. 5. The patient may stop CCM services at any time (effective at the end of the month) by phone call to the office staff.  Patient agreed to services and verbal consent obtained.   The patient verbalized understanding of instructions, educational materials, and care plan provided today and agreed to receive a mailed copy of patient instructions, educational materials, and care plan.  Telephone follow up appointment with pharmacy team member scheduled for: 9 months  Charlene Brooke, PharmD, BCACP Clinical Pharmacist [facility] at [facility] [phone]  Fat and Cholesterol Restricted Eating Plan Eating a diet that limits fat and cholesterol may help lower your risk for heart disease and other conditions. Your body needs fat and cholesterol for basic functions, but eating too much of these things can be harmful to your health. Your health care provider may order  lab tests to check your blood fat (lipid) and cholesterol levels. This helps your health care provider understand your risk for certain conditions and whether you need to make diet changes. Work with your health care provider or dietitian to make an eating plan that is right for you. Your plan includes:  Limit your fat intake to ______% or less of your total calories a day.  Limit your saturated fat intake to ______% or less of your total calories a day.  Limit the amount of cholesterol in your diet to less than _________mg a day.  Eat  ___________ g of fiber a day. What are tips for following this plan? General guidelines  If you are overweight, work with your health care provider to lose weight safely. Losing just 5-10% of your body weight can improve your overall health and help prevent diseases such as diabetes and heart disease.  Avoid: ? Foods with added sugar. ? Fried foods. ? Foods that contain partially hydrogenated oils, including stick margarine, some tub margarines, cookies, crackers, and other baked goods.  Limit alcohol intake to no more than 1 drink a day for nonpregnant women and 2 drinks a day for men. One drink equals 12 oz of beer, 5 oz of wine, or 1 oz of hard liquor.   Reading food labels  Check food labels for: ? Trans fats, partially hydrogenated oils, or high amounts of saturated fat. Avoid foods that contain saturated fat and trans fat. ? The amount of cholesterol in each serving. Try to eat no more than 200 mg of cholesterol each day. ? The amount of fiber in each serving. Try to eat at least 20-30 g of fiber each day.  Choose foods with healthy fats, such as: ? Monounsaturated and polyunsaturated fats. These include olive and canola oil, flaxseeds, walnuts, almonds, and seeds. ? Omega-3 fats. These are found in foods such as salmon, mackerel, sardines, tuna, flaxseed oil, and ground flaxseeds.  Choose grain products that have whole grains. Look for the word "whole" as the first word in the ingredient list. Cooking  Cook foods using methods other than frying. Baking, boiling, grilling, and broiling are some healthy options.  Eat more home-cooked food and less restaurant, buffet, and fast food.  Avoid cooking using saturated fats. ? Animal sources of saturated fats include meats, butter, and cream. ? Plant sources of saturated fats include palm oil, palm kernel oil, and coconut oil. Meal planning  At meals, imagine dividing your plate into fourths: ? Fill one-half of your plate with  vegetables and green salads. ? Fill one-fourth of your plate with whole grains. ? Fill one-fourth of your plate with lean protein foods.  Eat fish that is high in omega-3 fats at least two times a week.  Eat more foods that contain fiber, such as whole grains, beans, apples, broccoli, carrots, peas, and barley. These foods help promote healthy cholesterol levels in the blood.   Recommended foods Grains  Whole grains, such as whole wheat or whole grain breads, crackers, cereals, and pasta. Unsweetened oatmeal, bulgur, barley, quinoa, or brown rice. Corn or whole wheat flour tortillas. Vegetables  Fresh or frozen vegetables (raw, steamed, roasted, or grilled). Green salads. Fruits  All fresh, canned (in natural juice), or frozen fruits. Meats and other protein foods  Ground beef (85% or leaner), grass-fed beef, or beef trimmed of fat. Skinless chicken or [place]. Ground chicken or [place]. Pork trimmed of fat. All fish and seafood. Egg whites. Dried beans, peas, or  lentils. Unsalted nuts or seeds. Unsalted canned beans. Natural nut butters without added sugar and oil. Dairy  Low-fat or nonfat dairy products, such as skim or 1% milk, 2% or reduced-fat cheeses, low-fat and fat-free ricotta or cottage cheese, or plain low-fat and nonfat yogurt. Fats and oils  Tub margarine without trans fats. Light or reduced-fat mayonnaise and salad dressings. Avocado. Olive, canola, sesame, or safflower oils. The items listed above may not be a complete list of foods and beverages you can eat. Contact a dietitian for more information. Foods to avoid Grains  White bread. White pasta. White rice. Cornbread. Bagels, pastries, and croissants. Crackers and snack foods that contain trans fat and hydrogenated oils. Vegetables  Vegetables cooked in cheese, cream, or butter sauce. Fried vegetables. Fruits  Canned fruit in heavy syrup. Fruit in cream or butter sauce. Fried fruit. Meats and other protein  foods  Fatty cuts of meat. Ribs, chicken wings, bacon, sausage, bologna, salami, chitterlings, fatback, hot dogs, bratwurst, and packaged lunch meats. Liver and organ meats. Whole eggs and egg yolks. Chicken and [place] with skin. Fried meat. Dairy  Whole or 2% milk, cream, half-and-half, and cream cheese. Whole milk cheeses. Whole-fat or sweetened yogurt. Full-fat cheeses. Nondairy creamers and whipped toppings. Processed cheese, cheese spreads, and cheese curds. Beverages  Alcohol. Sugar-sweetened drinks such as sodas, lemonade, and fruit drinks. Fats and oils  Butter, stick margarine, lard, shortening, ghee, or bacon fat. Coconut, palm kernel, and palm oils. Sweets and desserts  Corn syrup, sugars, honey, and molasses. Candy. Jam and jelly. Syrup. Sweetened cereals. Cookies, pies, cakes, donuts, muffins, and ice cream. The items listed above may not be a complete list of foods and beverages you should avoid. Contact a dietitian for more information. Summary  Your body needs fat and cholesterol for basic functions. However, eating too much of these things can be harmful to your health.  Work with your health care provider and dietitian to follow a diet low in fat and cholesterol. Doing this may help lower your risk for heart disease and other conditions.  Choose healthy fats, such as monounsaturated and polyunsaturated fats, and foods high in omega-3 fatty acids.  Eat fiber-rich foods, such as whole grains, beans, peas, fruits, and vegetables.  Limit or avoid alcohol, fried foods, and foods high in saturated fats, partially hydrogenated oils, and sugar. This information is not intended to replace advice given to you by your health care provider. Make sure you discuss any questions you have with your health care provider. Document Revised: 07/15/2020 Document Reviewed: 03/18/2020 Elsevier Patient Education  2021 [employer].

## 2020-12-28 NOTE — Chronic Care Management (AMB) (Signed)
Chronic Care Management Pharmacy Note  12/28/2020 Name:  Gina Jefferson MRN:  [id number] DOB:  1951-07-15  Subjective: Gina Jefferson is an 70 y.o. year old female who is a primary patient of Gina Koch, MD.  The CCM team was consulted for assistance with disease management and care coordination needs.    Engaged with patient by telephone for initial visit in response to provider referral for pharmacy case management and/or care coordination services.   Consent to Services:  The patient was given the following information about Chronic Care Management services today, agreed to services, and gave verbal consent: 1. CCM service includes personalized support from designated clinical staff supervised by the primary care provider, including individualized plan of care and coordination with other care providers 2. 24/7 contact phone numbers for assistance for urgent and routine care needs. 3. Service will only be billed when office clinical staff spend 20 minutes or more in a month to coordinate care. 4. Only one practitioner may furnish and bill the service in a calendar month. 5.The patient may stop CCM services at any time (effective at the end of the month) by phone call to the office staff. 6. The patient will be responsible for cost sharing (co-pay) of up to 20% of the service fee (after annual deductible is met). Patient agreed to services and consent obtained.  Patient lives at home with her husband and works at [employer]. She lost her son 11 years ago and currently cares for her 90-year old granddaughter.  Recent office visits: 06/29/20 Dr Gina Jefferson OV: chronic f/u, refilled meclizine prn, increased amlodipine to 10 mg due to BP above goal.  Recent consult visits: 08/20/20 Dr Gina Jefferson (phys med/rehab): F/u for low back pain  Objective:  Lab Results  Component Value Date   CREATININE 0.79 06/29/2020   BUN 12 06/29/2020   GFR 111.28 02/23/2018   GFRNONAA >90 11/23/2011   GFRAA  >90 11/23/2011   NA 141 06/29/2020   K 4.0 06/29/2020   CALCIUM 9.7 06/29/2020   CO2 31 06/29/2020    Lab Results  Component Value Date/Time   HGBA1C 5.9 (H) 06/29/2020 11:01 AM   HGBA1C 5.8 05/16/2019 12:00 AM   HGBA1C 6.0 02/21/2017 02:55 PM   GFR 111.28 02/23/2018 10:23 AM   GFR 115.54 02/21/2017 02:55 PM   MICROALBUR 0.1 01/21/2014 10:43 AM    Last diabetic Eye exam:  Lab Results  Component Value Date/Time   HMDIABEYEEXA No Retinopathy 03/16/2018 12:00 AM    Last diabetic Foot exam:  Lab Results  Component Value Date/Time   HMDIABFOOTEX  05/16/2013 12:00 AM    Done @ Dr. Herbert Jefferson No diabetic retinopathy, no diabetic macular edema OD 20/25 OS 20/25     Lab Results  Component Value Date   CHOL 284 (H) 06/29/2020   HDL 52 06/29/2020   LDLCALC 201 (H) 06/29/2020   LDLDIRECT 175.0 02/21/2017   TRIG 148 06/29/2020   CHOLHDL 5.5 (H) 06/29/2020    Hepatic Function Latest Ref Rng & Units 06/29/2020 05/16/2019 02/23/2018  Total Protein 6.1 - 8.1 g/dL 7.4 - 7.7  Albumin 3.5 - 5.2 g/dL - - 4.0  AST 10 - 35 U/L 26 - 23  ALT 6 - 29 U/L 27 - 22  Alk Phosphatase 25 - 125 - 91 75  Total Bilirubin 0.2 - 1.2 mg/dL 0.6 - 0.9  Bilirubin, Direct 0.0 - 0.3 mg/dL - - -    Lab Results  Component Value Date/Time  TSH 0.81 06/22/2018 02:03 PM   TSH 1.51 02/18/2016 09:05 AM   FREET4 0.86 07/17/2009 02:16 PM   FREET4 0.8 09/12/2008 11:37 AM    CBC Latest Ref Rng & Units 06/29/2020 05/16/2019 02/23/2018  WBC 3.8 - 10.8 Thousand/uL 5.3 5.4 6.3  Hemoglobin 11.7 - 15.5 g/dL 14.3 13.9 13.9  Hematocrit 35.0 - 45.0 % 43.3 45 41.6  Platelets 140 - 400 Thousand/uL 228 193 226.0    Lab Results  Component Value Date/Time   VD25OH 15.97 (L) 02/13/2017 09:41 AM    Clinical ASCVD: No  The 10-year ASCVD risk score Gina Jefferson DC Jr., et al., 2013) is: 27.4%   Values used to calculate the score:     Age: 43 years     Sex: Female     Is Non-Hispanic African American: Yes     Diabetic: Yes      Tobacco smoker: No     Systolic Blood Pressure: 458 mmHg     Is BP treated: Yes     HDL Cholesterol: 52 mg/dL     Total Cholesterol: 284 mg/dL    Depression screen [facility] 2/9 06/29/2020 06/28/2019 02/23/2018  Decreased Interest 0 0 0  Down, Depressed, Hopeless 0 0 0  PHQ - 2 Score 0 0 0  Altered sleeping - - 3  Tired, decreased energy - - 0  Change in appetite - - 0  Feeling bad or failure about yourself  - - 0  Trouble concentrating - - 0  Moving slowly or fidgety/restless - - 0  Suicidal thoughts - - 0  PHQ-9 Score - - 3  Difficult doing work/chores - - Not difficult at all   No flowsheet data found.  BP Readings from Last 3 Encounters:  06/29/20 (!) 144/88  12/25/19 (!) 144/86  06/28/19 140/82   Pulse Readings from Last 3 Encounters:  06/29/20 46  12/25/19 96  06/28/19 86   Wt Readings from Last 3 Encounters:  06/29/20 158 lb (71.7 kg)  12/25/19 160 lb (72.6 kg)  06/28/19 163 lb (73.9 kg)    Assessment/Interventions: Review of patient past medical history, allergies, medications, health status, including review of consultants reports, laboratory and other test data, was performed as part of comprehensive evaluation and provision of chronic care management services.   SDOH:  (Social Determinants of Health) assessments and interventions performed:  SDOH Interventions   Flowsheet Row Most Recent Value  SDOH Interventions   Financial Strain Interventions Intervention Not Indicated      CCM Care Plan  Allergies  Allergen Reactions  . Clindamycin/Lincomycin Rash  . Ciprofloxacin Rash  . Penicillins Rash  . Statins Other (See Comments)    Muscle aches  . Metformin Diarrhea and Other (See Comments)    Vomiting     Medications Reviewed Today    Reviewed by Gina Jefferson, [facility] (Pharmacist) on 12/28/20 at 1047  Med List Status: <None>  Medication Order Taking? Sig Documenting Provider Last Dose Status Informant  ALPRAZolam (XANAX) 0.25 MG tablet [id number] Yes  Take 1 tablet (0.25 mg total) by mouth daily as needed for anxiety. Gina Rail, MD Taking Active   amLODipine (NORVASC) 10 MG tablet [id number] Yes Take 1 tablet (10 mg total) by mouth daily. Gina Koch, MD Taking Active   Ascorbic Acid (VITAMIN C) 500 MG CAPS [id number] Yes Take 500 mg by mouth daily. [provider] Taking Active   benzonatate (TESSALON) 200 MG capsule [id number] Yes Take 1 capsule (200 mg total) by mouth  3 (three) times daily as needed for cough. Myrlene Broker, MD Taking Active   Biotin 1000 MCG tablet [id number] Yes Take 1,000 mcg by mouth daily. [provider] Taking Active   cetirizine (ZYRTEC) 10 MG tablet [id number] Yes Take 10 mg by mouth daily. [provider] Taking Active   Cholecalciferol (VITAMIN D PO) [id number] Yes Take 5,000 Units by mouth daily. [provider] Taking Active Self  diclofenac (VOLTAREN) 75 MG EC tablet [id number] Yes Take 75 mg by mouth 1 day or 1 dose. [provider] Taking Active Self  Diclofenac Sodium 3 % GEL [id number] Yes Reported on 05/05/2016 [provider] Taking Active            Med Note Beau Fanny Orrin Brigham*   Thu May 05, 2016  9:31 AM) Received from: External Pharmacy  dicyclomine (BENTYL) 10 MG capsule [id number] Yes Take 1 capsule (10 mg total) by mouth 2 (two) times daily as needed for spasms. Esterwood, Amy S, PA-C Taking Active   gabapentin (NEURONTIN) 100 MG capsule [id number] Yes Take 100 mg by mouth at bedtime. [provider] Taking Active   Garlic 1000 MG CAPS [id number] Yes Take by mouth daily. [provider] Taking Active   lansoprazole (PREVACID) 30 MG capsule [id number] Yes TAKE 1 CAPSULE(30 MG) BY MOUTH AT BEDTIME Nandigam, Eleonore Chiquito, MD Taking Active   meclizine (ANTIVERT) 12.5 MG tablet [id number] Yes Take 1 tablet (12.5 mg total) by mouth 3 (three) times daily as needed for dizziness. Myrlene Broker, MD Taking Active    ondansetron [facility]) 4 MG tablet [id number] Yes 1 tablet by mouth every 6 to 8 hours as needed for nausea Sammuel Cooper, PA-C Taking Active   valsartan (DIOVAN) 160 MG tablet [id number] Yes TAKE 1 TABLET BY MOUTH  DAILY Myrlene Broker, MD Taking Active           Patient Active Problem List   Diagnosis Date Noted  . Neck pain on left side 05/21/2018  . Vertigo 12/11/2015  . Routine general medical examination at a health care facility 02/12/2015  . Impaired fasting blood sugar 01/21/2011  . Allergic rhinitis 09/24/2010  . Hyperlipidemia 06/23/2009  . Essential hypertension 06/23/2009  . 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   Immunization History  Administered Date(s) Administered  . Fluad Quad(high Dose 65+) 08/21/2019, 08/27/2020  . Influenza Split 08/28/2012  . Influenza Whole 08/08/2005, 08/19/2008, 09/28/2009, 09/28/2010  . Influenza,inj,Quad PF,6+ Mos 08/28/2013, 09/11/2014, 09/06/2016  . Influenza-Unspecified 08/12/2013, 09/12/2015, 09/07/2017, 09/11/2018  . PFIZER(Purple Top)SARS-COV-2 Vaccination 12/07/2019, 12/26/2019  . Pneumococcal Conjugate-13 02/21/2017  . Pneumococcal Polysaccharide-23 01/16/2006, 02/23/2018  . Td 01/17/2005  . Tdap 02/27/2015  . Zoster 01/15/2013    Conditions to be addressed/monitored:  HTN, HLD and Anxiety, GERD, Osteoarthritis  Patient Care Plan: CCM Pharmacy Care Plan    Problem Identified: HTN, HLD, GERD, Osteoarthritis, Anxiety   Priority: High    Goal: Patient-Specific Goal   Start Date: 12/28/2020  Expected End Date: 09/27/2021  This Visit's Progress: On track  Priority: High  Note:   Current Barriers:  . Unable to independently monitor therapeutic efficacy . Unable to achieve control of cholesterol   Pharmacist Clinical Goal(s):  Marland Kitchen Over the next 270 days, patient will achieve adherence to monitoring guidelines and medication adherence to achieve therapeutic efficacy . achieve control of LDL as evidenced by laboratory improvement  through collaboration with PharmD and provider.   Interventions: . 1:1 collaboration with Myrlene Broker, MD regarding development and update of comprehensive  plan of care as evidenced by provider attestation and co-signature . Inter-disciplinary care team collaboration (see longitudinal plan of care) . Comprehensive medication review performed; medication list updated in electronic medical record  Hypertension (BP goal <130/80) -controlled -Current treatment: . Valsartan 160 mg daily . Amlodipine 10 mg daily -Medications previously tried: n/a -Current home readings: 118/62, pulse 40s-50s -Denies hypotensive/hypertensive symptoms -Discussed low pulse at home, pt reports has been an issues since the summer, however she denies any symptoms related to this. Pulse was 46 at last PCP visit and no interventions noted. As long as she is asymptomatic she may keep regular annual follow up with PCP. Advised to contact PCP earlier if she experiences lightheadedness, dizziness, extreme fatigue -Recommend to continue current medication  Hyperlipidemia: (LDL goal < 130) -uncontrolled - ASCVD risk high (27%). Pt has tried and failed multiple statins and is unwilling to take cholesterol medication at this time -Current treatment: . No medication -Medications previously tried: "statins" - muscle aches; atorvastatin, simvastatin, rosuvastatin, pravastatin, ezetimibe -Current dietary patterns: avoids high-cholesterol foods -Current exercise habits: Wii exercies, gardening;  -Educated on Cholesterol goals;  Benefits of statin for ASCVD risk reduction; Importance of limiting foods high in cholesterol; Exercise goal of 150 minutes per week; -Counseled on diet and exercise extensively  GERD / GI upset (Goal: manage symptoms) -controlled - per patient report -Current treatment  . Lansoprazole 30 mg HS . Dicyclomine 10 mg BID prn  . Ondansetron 4 mg PRN -Recommended to continue current  medications  Anxiety/Insomnia (Goal: manage symptoms) -controlled - per pt report. She lost her son 11 years ago and certain times of year are harder than others, she very rarely needs medication -Current treatment: . Alprazolam 0.25 mg daily PRN (#10) -Medications previously tried/failed: sertraline, trazodone, amitriptyline, ramelteon, zolpidem  -PHQ9: 0 -GAD: not on file -Connected with PCP for mental health support -Recommended to continue current medication  Arthritis / Neck pain (Goal: manage symptoms) -controlled - per pt report, much improved since cortisone injection 2 weeks ago -Current treatment  . Diclofenac 75 mg PRN - usually once daily . Voltaren gel PRN . Gabapentin 100 mg HS x 2 wks (temporary from ortho) -Recommended to continue current medication  Health maintenance -Vaccine gaps: none -Current treatment  . Vitamin C 500 mg daily . Biotin 1000 mcg daily . Vitamin D 5000 IU daily . Garlic 4081 mg daily . Cetirizine 10 mg daily . Benzonatate 200 mg TID prn . Meclizine 12.5 mg PRN -Patient is satisfied with current regimen and denies issues -Recommended to continue current medications  Patient Goals/Self-Care Activities . Over the next 270 days, patient will:  take medications as prescribed, target a minimum of 150 minutes of moderate intensity exercise weekly and engage in dietary modifications by reducing cholesterol in diet  Follow Up Plan: Telephone follow up appointment with care management team member scheduled for:  9 months      Medication Assistance: None required.  Patient affirms current coverage meets needs.  Patient's preferred pharmacy is:  [id number] [address], [address] [place] [address] Phone: [phone] Fax: [phone]  [place], [address], [address] [place], [address] [place]  [address] Phone: [phone] Fax: [phone]  Uses pill box? No - prefers bottles Pt endorses 100% compliance  We discussed: Current pharmacy is preferred with insurance plan and patient  is satisfied with pharmacy services  Plan to: Continue current medication management strategy    Follow Up:  Patient agrees to Care Plan and Follow-up.  Plan: Telephone follow up appointment with care management team member scheduled for:  9 months  Charlene Brooke, PharmD, [facility] Clinical Pharmacist [facility] [phone]

## 2020-12-28 NOTE — Progress Notes (Signed)
° ° °  Chronic Care Management Pharmacy Assistant   Name: Gina Jefferson  MRN: [id number] DOB: 02-Apr-1951  Reason for Encounter: Chart Review   PCP : Hoyt Koch, MD  Allergies:   Allergies  Allergen Reactions   Clindamycin/Lincomycin Rash   Ciprofloxacin Rash   Penicillins Rash   Statins Other (See Comments)    Muscle aches   Metformin Diarrhea and Other (See Comments)    Vomiting     Medications: Outpatient Encounter Medications as of 12/28/2020  Medication Sig Note   ALPRAZolam (XANAX) 0.25 MG tablet Take 1 tablet (0.25 mg total) by mouth daily as needed for anxiety.    amLODipine (NORVASC) 10 MG tablet Take 1 tablet (10 mg total) by mouth daily.    Ascorbic Acid (VITAMIN C) 500 MG CAPS Take 500 mg by mouth daily.    benzonatate (TESSALON) 200 MG capsule Take 1 capsule (200 mg total) by mouth 3 (three) times daily as needed for cough.    Biotin 1000 MCG tablet Take 1,000 mcg by mouth daily.    cetirizine (ZYRTEC) 10 MG tablet Take 10 mg by mouth daily.    Cholecalciferol (VITAMIN D PO) Take 5,000 Units by mouth daily.    diclofenac (VOLTAREN) 75 MG EC tablet Take 75 mg by mouth 1 day or 1 dose.    Diclofenac Sodium 3 % GEL Reported on 05/05/2016 05/05/2016: Received from: External Pharmacy   dicyclomine (BENTYL) 10 MG capsule Take 1 capsule (10 mg total) by mouth 2 (two) times daily as needed for spasms.    gabapentin (NEURONTIN) 100 MG capsule Take 100 mg by mouth at bedtime.    Garlic 2774 MG CAPS Take by mouth daily.    lansoprazole (PREVACID) 30 MG capsule TAKE 1 CAPSULE(30 MG) BY MOUTH AT BEDTIME    meclizine (ANTIVERT) 12.5 MG tablet Take 1 tablet (12.5 mg total) by mouth 3 (three) times daily as needed for dizziness.    ondansetron (ZOFRAN) 4 MG tablet 1 tablet by mouth every 6 to 8 hours as needed for nausea    valsartan (DIOVAN) 160 MG tablet TAKE 1 TABLET BY MOUTH  DAILY    No facility-administered encounter medications on file as of  12/28/2020.    Current Diagnosis: Patient Active Problem List   Diagnosis Date Noted   Neck pain on left side 05/21/2018   Vertigo 12/11/2015   Routine general medical examination at a health care facility 02/12/2015   Impaired fasting blood sugar 01/21/2011   Allergic rhinitis 09/24/2010   Hyperlipidemia 06/23/2009   Essential hypertension 06/23/2009   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    Goals Addressed   None     Follow-Up:  Pharmacist Review    Reviewed chart for medication changes and adherence.   No gaps in adherence identified. Patient has follow up scheduled with pharmacy team. No further action required.   Wendy Poet, [place] [phone]

## 2021-01-06 ENCOUNTER — Other Ambulatory Visit: Payer: Self-pay | Admitting: Physician Assistant

## 2021-01-24 ENCOUNTER — Other Ambulatory Visit: Payer: Self-pay | Admitting: Internal Medicine

## 2021-02-08 ENCOUNTER — Other Ambulatory Visit: Payer: Self-pay

## 2021-02-08 ENCOUNTER — Ambulatory Visit (INDEPENDENT_AMBULATORY_CARE_PROVIDER_SITE_OTHER): Payer: Medicare Other

## 2021-02-08 ENCOUNTER — Ambulatory Visit: Payer: Medicare Other | Admitting: Podiatry

## 2021-02-08 DIAGNOSIS — M216X1 Other acquired deformities of right foot: Secondary | ICD-10-CM

## 2021-02-08 DIAGNOSIS — M21612 Bunion of left foot: Secondary | ICD-10-CM | POA: Diagnosis not present

## 2021-02-08 DIAGNOSIS — M216X2 Other acquired deformities of left foot: Secondary | ICD-10-CM | POA: Diagnosis not present

## 2021-02-08 DIAGNOSIS — M2011 Hallux valgus (acquired), right foot: Secondary | ICD-10-CM

## 2021-02-08 DIAGNOSIS — M21611 Bunion of right foot: Secondary | ICD-10-CM

## 2021-02-08 DIAGNOSIS — M2012 Hallux valgus (acquired), left foot: Secondary | ICD-10-CM

## 2021-02-08 DIAGNOSIS — M66872 Spontaneous rupture of other tendons, left ankle and foot: Secondary | ICD-10-CM

## 2021-02-08 NOTE — Progress Notes (Signed)
Subjective:  Patient ID: Gina Jefferson, female    DOB: 1951-02-08,  MRN: [id number]  Chief Complaint  Patient presents with   Foot Pain    Left foot pain. PT stated that the pain is mainly in the arch and heel     70 y.o. female presents with the above complaint. History confirmed with patient.  She localizes to the inside of the medial ankle.  Extends up the heel into the side of the leg.  Pain is sharp and is worse first thing in the morning.  She has tried a lace up ankle brace as well as physical therapy and has not improved.  Objective:  Physical Exam: warm, good capillary refill, no trophic changes or ulcerative lesions, normal DP and PT pulses and normal sensory exam.  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 Pain of both heels      Plan:  Patient was evaluated and treated and all questions answered.   Discussed the etiology and treatment options for posterior tibial tendinitis including stretching, formal physical therapy, supportive shoegears such as a running shoe or sneaker, pre fabricated orthoses, injection therapy, and oral medications. We also discussed the role of surgical treatment of this for patients who do not improve after exhausting non-surgical treatment options.   So far she has not had much improvement with physical therapy and bracing as well as anti-inflammatories.  My concern currently is that she has a tear of the posterior tibial tendon.  I am ordering MRI to evaluate this and she will return after the MRI to review and discuss further treatment options   No follow-ups on file.

## 2021-02-08 NOTE — Patient Instructions (Signed)
Posterior Tibial Tendinitis  Posterior tibial tendinitis is irritation of a tendon called the posterior tibial tendon. Your posterior tibial tendon is a cord-like tissue that connects bones of your lower leg and foot to a muscle that: 1. Supports your arch. 2. Helps you raise up on your toes. 3. Helps you turn your foot down and in. This condition causes foot and ankle pain. It can also lead to a flat foot. What are the causes? This condition is most often caused by repeated stress to the tendon (overuse injury). It can also be caused by a sudden injury that stresses the tendon, such as landing on your foot after jumping or falling. What increases the risk? This condition is more likely to develop in: 1. People who play a sport that involves putting a lot of pressure on the feet, such as: 1. Basketball. 2. Tennis. 3. Soccer. 4. Hockey. 2. Runners. 3. Females who are older than 69 years of age and are overweight. 4. People with diabetes. 5. People with decreased foot stability. 6. People with flat feet. What are the signs or symptoms? Symptoms include: 1. Pain in the inner ankle. 2. Pain at the arch of your foot. 3. Pain that gets worse with running, walking, or standing. 4. Swelling on the inside of your ankle and foot. 5. Weakness in your ankle or foot. 6. Inability to stand up on tiptoe. 7. Flattening of the arch of your foot. How is this diagnosed? This condition may be diagnosed based on: 1. Your symptoms. 2. Your medical history. 3. A physical exam. 4. Tests, such as: 1. X-ray. 2. MRI. 3. Ultrasound. How is this treated? This condition may be treated by: 1. Putting ice to the injured area. 2. Taking NSAIDs, such as ibuprofen, to reduce pain and swelling. 3. Wearing a special shoe or shoe insert to support your arch (orthotic). 4. Having physical therapy. 5. Replacing high-impact exercise with low-impact exercise, such as swimming or cycling. If your symptoms do not  improve with these treatments, you may need to wear a splint, removable walking boot, or short leg cast for 6-8 weeks to keep your foot and ankle still (immobilized). Follow these instructions at home: If you have a cast, splint, or boot:  Keep it clean and dry.  Check the skin around it every day. Tell your health care provider about any concerns. If you have a cast:  Do not stick anything inside it to scratch your skin. Doing that increases your risk of infection.  You may put lotion on dry skin around the edges of the cast. Do not put lotion on the skin underneath the cast. If you have a splint or boot:  Wear it as told by your health care provider. Remove it only as told by your health care provider.  Loosen it if your toes tingle, become numb, or turn cold and blue. Bathing 1. Do not take baths, swim, or use a hot tub until your health care provider approves. Ask your health care provider if you may take showers. 2. If your cast, splint, or boot is not waterproof: ? Do not let it get wet. ? Cover it with a waterproof covering while you take a bath or a shower. Managing pain and swelling   1. If directed, put ice on the injured area. ? If you have a removable splint or boot, remove it as told by your health care provider. ? Put ice in a plastic bag. ? Place a towel between your   skin and the bag or between your cast and the bag. ? Leave the ice on for 20 minutes, 2-3 times a day. 2. Move your toes often to reduce stiffness and swelling. 3. Raise (elevate) the injured area above the level of your heart while you are sitting or lying down. Activity  Do not use the injured foot to support your body weight until your health care provider says that you can. Use crutches as told by your health care provider.  Do not do activities that make pain or swelling worse.  Ask your health care provider when it is safe to drive if you have a cast, splint, or boot on your foot.  Return to  your normal activities as told by your health care provider. Ask your health care provider what activities are safe for you.  Do exercises as told by your health care provider. General instructions  Take over-the-counter and prescription medicines only as told by your health care provider.  If you have an orthotic, use it as told by your health care provider.  Keep all follow-up visits as told by your health care provider. This is important. How is this prevented?  Wear footwear that is appropriate to your athletic activity.  Avoid athletic activities that cause pain or swelling in your ankle or foot.  Before being active, do range-of-motion and stretching exercises.  If you develop pain or swelling while training, stop training.  If you have pain or swelling that does not improve after a few days of rest, see your health care provider.  If you start a new athletic activity, start gradually so you can build up your strength and flexibility. Contact a health care provider if:  Your symptoms get worse.  Your symptoms do not improve in 6-8 weeks.  You develop new, unexplained symptoms.  Your splint, boot, or cast gets damaged. Summary  Posterior tibial tendinitis is irritation of a tendon called the posterior tibial tendon.  This condition is most often caused by repeated stress to the tendon (overuse injury).  This condition causes foot pain and ankle pain. It can also lead to a flat foot.  This condition may be treated by not doing high-impact activities, applying ice, having physical therapy, wearing orthotics, and wearing a cast, splint, or boot if needed. This information is not intended to replace advice given to you by your health care provider. Make sure you discuss any questions you have with your health care provider. Document Revised: 03/12/2019 Document Reviewed: 01/17/2019 Elsevier Patient Education  2020 [employer]  Posterior Tibial Tendinitis Rehab Ask  your health care provider which exercises are safe for you. Do exercises exactly as told by your health care provider and adjust them as directed. It is normal to feel mild stretching, pulling, tightness, or discomfort as you do these exercises. Stop right away if you feel sudden pain or your pain gets worse. Do not begin these exercises until told by your health care provider. Stretching and range-of-motion exercises These exercises warm up your muscles and joints and improve the movement and flexibility in your ankle and foot. These exercises may also help to relieve pain. Standing wall calf stretch, knee straight   4. Stand with your hands against a wall. 5. Extend your left / right leg behind you, and bend your front knee slightly. If directed, place a folded washcloth under the arch of your foot for support. 6. Point the toes of your back foot slightly inward. 7. Keeping your heels   on the floor and your back knee straight, shift your weight toward the wall. Do not allow your back to arch. You should feel a gentle stretch in your upper left / right calf. 8. Hold this position for 10 seconds. Repeat 10 times. Complete this exercise 2 times a day. Standing wall calf stretch, knee bent 7. Stand with your hands against a wall. 8. Extend your left / right leg behind you, and bend your front knee slightly. If directed, place a folded washcloth under the arch of your foot for support. 9. Point the toes of your back foot slightly inward. 10. Unlock your back knee so it is bent. Keep your heels on the floor. You should feel a gentle stretch deep in your lower left / right calf. 11. Hold this position for 10 seconds. Repeat 10 times. Complete this exercise 2 times a day. Strengthening exercises These exercises build strength and endurance in your ankle and foot. Endurance is the ability to use your muscles for a long time, even after they get tired. Ankle inversion with band 8. Secure one end of a  rubber exercise band or tubing to a fixed object, such as a table leg or a pole, that will stay still when the band is pulled. 9. Loop the other end of the band around the middle of your left / right foot. 10. Sit on the floor facing the object with your left / right leg extended. The band or tube should be slightly tense when your foot is relaxed. 11. Leading with your big toe, slowly bring your left / right foot and ankle inward, toward your other foot (inversion). 12. Hold this position for 10 seconds. 13. Slowly return your foot to the starting position. Repeat 10 times. Complete this exercise 2 times a day. Towel curls   5. Sit in a chair on a non-carpeted surface, and put your feet on the floor. 6. Place a towel in front of your feet. 7. Keeping your heel on the floor, put your left / right foot on the towel. 8. Pull the towel toward you by grabbing the towel with your toes and curling them under. Keep your heel on the floor while you do this. 9. Let your toes relax. 10. Grab the towel with your toes again. Keep going until the towel is completely underneath your foot. Repeat 10 times. Complete this exercise 2 times a day. Balance exercise This exercise improves or maintains your balance. Balance is important in preventing falls. Single leg stand 6. Without wearing shoes, stand near a railing or in a doorway. You may hold on to the railing or door frame as needed for balance. 7. Stand on your left / right foot. Keep your big toe down on the floor and try to keep your arch lifted. ? If balancing in this position is too easy, try the exercise with your eyes closed or while standing on a pillow. 8. Hold this position for 10 seconds. Repeat 10 times. Complete this exercise 2 times a day. This information is not intended to replace advice given to you by your health care provider. Make sure you discuss any questions you have with your health care provider.  

## 2021-02-09 ENCOUNTER — Encounter: Payer: Self-pay | Admitting: Podiatry

## 2021-02-19 ENCOUNTER — Other Ambulatory Visit: Payer: Self-pay

## 2021-02-19 ENCOUNTER — Ambulatory Visit
Admission: RE | Admit: 2021-02-19 | Discharge: 2021-02-19 | Disposition: A | Discharge status: Home / Self Care | Payer: Medicare Other | Source: Ambulatory Visit | Attending: Podiatry | Admitting: Podiatry

## 2021-02-19 DIAGNOSIS — M25572 Pain in left ankle and joints of left foot: Secondary | ICD-10-CM | POA: Diagnosis not present

## 2021-02-19 DIAGNOSIS — M76822 Posterior tibial tendinitis, left leg: Secondary | ICD-10-CM | POA: Diagnosis not present

## 2021-02-19 DIAGNOSIS — M66872 Spontaneous rupture of other tendons, left ankle and foot: Secondary | ICD-10-CM

## 2021-02-19 DIAGNOSIS — S86812A Strain of other muscle(s) and tendon(s) at lower leg level, left leg, initial encounter: Secondary | ICD-10-CM | POA: Diagnosis not present

## 2021-03-04 DIAGNOSIS — M25551 Pain in right hip: Secondary | ICD-10-CM | POA: Diagnosis not present

## 2021-03-11 DIAGNOSIS — H04123 Dry eye syndrome of bilateral lacrimal glands: Secondary | ICD-10-CM | POA: Diagnosis not present

## 2021-03-11 DIAGNOSIS — H35363 Drusen (degenerative) of macula, bilateral: Secondary | ICD-10-CM | POA: Diagnosis not present

## 2021-03-11 DIAGNOSIS — H40013 Open angle with borderline findings, low risk, bilateral: Secondary | ICD-10-CM | POA: Diagnosis not present

## 2021-03-11 DIAGNOSIS — H524 Presbyopia: Secondary | ICD-10-CM | POA: Diagnosis not present

## 2021-03-11 DIAGNOSIS — H35373 Puckering of macula, bilateral: Secondary | ICD-10-CM | POA: Diagnosis not present

## 2021-03-18 ENCOUNTER — Other Ambulatory Visit: Payer: Self-pay

## 2021-03-18 ENCOUNTER — Ambulatory Visit: Payer: Medicare Other | Admitting: Podiatry

## 2021-03-18 DIAGNOSIS — M958 Other specified acquired deformities of musculoskeletal system: Secondary | ICD-10-CM | POA: Diagnosis not present

## 2021-03-18 DIAGNOSIS — M66872 Spontaneous rupture of other tendons, left ankle and foot: Secondary | ICD-10-CM | POA: Diagnosis not present

## 2021-03-18 NOTE — Patient Instructions (Signed)
Posterior Tibial Tendinitis  Posterior tibial tendinitis is irritation of a tendon called the posterior tibial tendon. Your posterior tibial tendon is a cord-like tissue that connects bones of your lower leg and foot to a muscle that: 1. Supports your arch. 2. Helps you raise up on your toes. 3. Helps you turn your foot down and in. This condition causes foot and ankle pain. It can also lead to a flat foot. What are the causes? This condition is most often caused by repeated stress to the tendon (overuse injury). It can also be caused by a sudden injury that stresses the tendon, such as landing on your foot after jumping or falling. What increases the risk? This condition is more likely to develop in: 1. People who play a sport that involves putting a lot of pressure on the feet, such as: 1. Basketball. 2. Tennis. 3. Soccer. 4. Hockey. 2. Runners. 3. Females who are older than 69 years of age and are overweight. 4. People with diabetes. 5. People with decreased foot stability. 6. People with flat feet. What are the signs or symptoms? Symptoms include: 1. Pain in the inner ankle. 2. Pain at the arch of your foot. 3. Pain that gets worse with running, walking, or standing. 4. Swelling on the inside of your ankle and foot. 5. Weakness in your ankle or foot. 6. Inability to stand up on tiptoe. 7. Flattening of the arch of your foot. How is this diagnosed? This condition may be diagnosed based on: 1. Your symptoms. 2. Your medical history. 3. A physical exam. 4. Tests, such as: 1. X-ray. 2. MRI. 3. Ultrasound. How is this treated? This condition may be treated by: 1. Putting ice to the injured area. 2. Taking NSAIDs, such as ibuprofen, to reduce pain and swelling. 3. Wearing a special shoe or shoe insert to support your arch (orthotic). 4. Having physical therapy. 5. Replacing high-impact exercise with low-impact exercise, such as swimming or cycling. If your symptoms do not  improve with these treatments, you may need to wear a splint, removable walking boot, or short leg cast for 6-8 weeks to keep your foot and ankle still (immobilized). Follow these instructions at home: If you have a cast, splint, or boot:  Keep it clean and dry.  Check the skin around it every day. Tell your health care provider about any concerns. If you have a cast:  Do not stick anything inside it to scratch your skin. Doing that increases your risk of infection.  You may put lotion on dry skin around the edges of the cast. Do not put lotion on the skin underneath the cast. If you have a splint or boot:  Wear it as told by your health care provider. Remove it only as told by your health care provider.  Loosen it if your toes tingle, become numb, or turn cold and blue. Bathing 1. Do not take baths, swim, or use a hot tub until your health care provider approves. Ask your health care provider if you may take showers. 2. If your cast, splint, or boot is not waterproof: ? Do not let it get wet. ? Cover it with a waterproof covering while you take a bath or a shower. Managing pain and swelling   1. If directed, put ice on the injured area. ? If you have a removable splint or boot, remove it as told by your health care provider. ? Put ice in a plastic bag. ? Place a towel between your  skin and the bag or between your cast and the bag. ? Leave the ice on for 20 minutes, 2-3 times a day. 2. Move your toes often to reduce stiffness and swelling. 3. Raise (elevate) the injured area above the level of your heart while you are sitting or lying down. Activity  Do not use the injured foot to support your body weight until your health care provider says that you can. Use crutches as told by your health care provider.  Do not do activities that make pain or swelling worse.  Ask your health care provider when it is safe to drive if you have a cast, splint, or boot on your foot.  Return to  your normal activities as told by your health care provider. Ask your health care provider what activities are safe for you.  Do exercises as told by your health care provider. General instructions  Take over-the-counter and prescription medicines only as told by your health care provider.  If you have an orthotic, use it as told by your health care provider.  Keep all follow-up visits as told by your health care provider. This is important. How is this prevented?  Wear footwear that is appropriate to your athletic activity.  Avoid athletic activities that cause pain or swelling in your ankle or foot.  Before being active, do range-of-motion and stretching exercises.  If you develop pain or swelling while training, stop training.  If you have pain or swelling that does not improve after a few days of rest, see your health care provider.  If you start a new athletic activity, start gradually so you can build up your strength and flexibility. Contact a health care provider if:  Your symptoms get worse.  Your symptoms do not improve in 6-8 weeks.  You develop new, unexplained symptoms.  Your splint, boot, or cast gets damaged. Summary  Posterior tibial tendinitis is irritation of a tendon called the posterior tibial tendon.  This condition is most often caused by repeated stress to the tendon (overuse injury).  This condition causes foot pain and ankle pain. It can also lead to a flat foot.  This condition may be treated by not doing high-impact activities, applying ice, having physical therapy, wearing orthotics, and wearing a cast, splint, or boot if needed. This information is not intended to replace advice given to you by your health care provider. Make sure you discuss any questions you have with your health care provider. Document Revised: 03/12/2019 Document Reviewed: 01/17/2019 Elsevier Patient Education  [place].  Posterior Tibial Tendinitis Rehab Ask  your health care provider which exercises are safe for you. Do exercises exactly as told by your health care provider and adjust them as directed. It is normal to feel mild stretching, pulling, tightness, or discomfort as you do these exercises. Stop right away if you feel sudden pain or your pain gets worse. Do not begin these exercises until told by your health care provider. Stretching and range-of-motion exercises These exercises warm up your muscles and joints and improve the movement and flexibility in your ankle and foot. These exercises may also help to relieve pain. Standing wall calf stretch, knee straight   4. Stand with your hands against a wall. 5. Extend your left / right leg behind you, and bend your front knee slightly. If directed, place a folded washcloth under the arch of your foot for support. 6. Point the toes of your back foot slightly inward. 7. Keeping your heels  on the floor and your back knee straight, shift your weight toward the wall. Do not allow your back to arch. You should feel a gentle stretch in your upper left / right calf. 8. Hold this position for 10 seconds. Repeat 10 times. Complete this exercise 2 times a day. Standing wall calf stretch, knee bent 7. Stand with your hands against a wall. 8. Extend your left / right leg behind you, and bend your front knee slightly. If directed, place a folded washcloth under the arch of your foot for support. 9. Point the toes of your back foot slightly inward. 10. Unlock your back knee so it is bent. Keep your heels on the floor. You should feel a gentle stretch deep in your lower left / right calf. 11. Hold this position for 10 seconds. Repeat 10 times. Complete this exercise 2 times a day. Strengthening exercises These exercises build strength and endurance in your ankle and foot. Endurance is the ability to use your muscles for a long time, even after they get tired. Ankle inversion with band 8. Secure one end of a  rubber exercise band or tubing to a fixed object, such as a table leg or a pole, that will stay still when the band is pulled. 9. Loop the other end of the band around the middle of your left / right foot. 10. Sit on the floor facing the object with your left / right leg extended. The band or tube should be slightly tense when your foot is relaxed. 62. Leading with your big toe, slowly bring your left / right foot and ankle inward, toward your other foot (inversion). 12. Hold this position for 10 seconds. 13. Slowly return your foot to the starting position. Repeat 10 times. Complete this exercise 2 times a day. Towel curls   5. Sit in a chair on a non-carpeted surface, and put your feet on the floor. 6. Place a towel in front of your feet. 7. Keeping your heel on the floor, put your left / right foot on the towel. 8. Pull the towel toward you by grabbing the towel with your toes and curling them under. Keep your heel on the floor while you do this. 9. Let your toes relax. 10. Grab the towel with your toes again. Keep going until the towel is completely underneath your foot. Repeat 10 times. Complete this exercise 2 times a day. Balance exercise This exercise improves or maintains your balance. Balance is important in preventing falls. Single leg stand 6. Without wearing shoes, stand near a railing or in a doorway. You may hold on to the railing or door frame as needed for balance. 7. Stand on your left / right foot. Keep your big toe down on the floor and try to keep your arch lifted. ? If balancing in this position is too easy, try the exercise with your eyes closed or while standing on a pillow. 8. Hold this position for 10 seconds. Repeat 10 times. Complete this exercise 2 times a day. This information is not intended to replace advice given to you by your health care provider. Make sure you discuss any questions you have with your health care provider.

## 2021-03-21 NOTE — Progress Notes (Signed)
  Subjective:  Patient ID: Gina Jefferson, female    DOB: 22-Oct-1951,  MRN: [id number]  Chief Complaint  Patient presents with  . Follow-up    Mri results     70 y.o. female returns with the above complaint. History confirmed with patient.  She is doing fairly well she has had some improvement with physical therapy and bracing.  She completed the MRI  Objective:  Physical Exam: warm, good capillary refill, no trophic changes or ulcerative lesions, normal DP and PT pulses and normal sensory exam.  Bilaterally she has hallux valgus with bunions and hallux malleus deformity Left Foot: She has minimal pain on palpation along the posterior tibial tendon and the deltoid medial complex, this is worse with resisted inversion  Radiographs: X-ray of both feet: no fracture, dislocation, swelling or degenerative changes noted, pes cavus foot type noted, met primus varus and hallux valgus is noted  Study Result  Narrative & Impression  CLINICAL DATA:  Severe ankle pain.  No known injury.  EXAM: MRI OF THE LEFT ANKLE WITHOUT CONTRAST  TECHNIQUE: Multiplanar, multisequence MR imaging of the ankle was performed. No intravenous contrast was administered.  COMPARISON:  None.  FINDINGS: TENDONS  Peroneal: Peroneal longus tendon intact. Peroneal brevis intact.  Posteromedial: Mild tendinosis of the posterior tibial tendon with a short-segment longitudinal split tear. Flexor hallucis longus tendon intact. Flexor digitorum longus tendon intact.  Anterior: Tibialis anterior tendon intact. Extensor hallucis longus tendon intact Extensor digitorum longus tendon intact.  Achilles:  Intact.  Plantar Fascia: Intact.  LIGAMENTS  Lateral: Anterior talofibular ligament intact. Calcaneofibular ligament intact. Posterior talofibular ligament intact. Anterior and posterior tibiofibular ligaments intact.  Medial: Deltoid ligament intact. Spring ligament intact.  CARTILAGE  Ankle  Joint: No joint effusion. Normal ankle mortise. 6 mm osteochondral lesion involving the medial corner of the talar dome with mild subchondral reactive marrow edema and partial-thickness overlying cartilage loss.  Subtalar Joints/Sinus Tarsi: Normal subtalar joints. No subtalar joint effusion. Normal sinus tarsi.  Bones: No marrow signal abnormality. No fracture or dislocation. Mild osteoarthritis of the talonavicular joint.  Soft Tissue: No fluid collection or hematoma. Muscles are normal without edema or atrophy. Tarsal tunnel is normal.  IMPRESSION: 1. Mild tendinosis of the posterior tibial tendon with a short-segment longitudinal split tear. 2. A 6 mm osteochondral lesion involving the medial corner of the talar dome with mild subchondral reactive marrow edema and partial-thickness overlying cartilage loss.   Electronically Signed   By: Kathreen Devoid   On: 02/20/2021 07:25    Assessment:   1. Nontraumatic tear of left tibialis posterior tendon   2. Osteochondral defect of talus      Plan:  Patient was evaluated and treated and all questions answered.   Reviewed the MRI with her.  I think the osteochondral lesion is likely an insignificant finding is probably chronic issue that is not symptomatic for her.  She has had some improvement.  We discussed the possibility of split tear and I think with physical therapy and further support there should continue to improve and heal.  If it does not improve or worsens surgical intervention may be advisable but currently I do not think this will be necessary for her like to see her back in a few months see how she is doing continue stretching and therapy program until then  Return in about 3 months (around 06/17/2021).

## 2021-04-29 DIAGNOSIS — M1712 Unilateral primary osteoarthritis, left knee: Secondary | ICD-10-CM | POA: Diagnosis not present

## 2021-04-29 DIAGNOSIS — S83242A Other tear of medial meniscus, current injury, left knee, initial encounter: Secondary | ICD-10-CM | POA: Diagnosis not present

## 2021-05-05 ENCOUNTER — Other Ambulatory Visit: Payer: Self-pay | Admitting: Internal Medicine

## 2021-05-07 ENCOUNTER — Telehealth: Payer: Self-pay | Admitting: Pharmacist

## 2021-05-07 NOTE — Progress Notes (Signed)
    Chronic Care Management Pharmacy Assistant   Name: Gina Jefferson  MRN: [id number] DOB: [birth year]   Reason for Encounter: Disease State   Conditions to be addressed/monitored: General Call   Recent office visits:  None ID  Recent consult visits:  02/08/21 Podiatry 03/18/21 [facility] visits:  None in previous 6 months  Medications: Outpatient Encounter Medications as of 05/07/2021  Medication Sig Note   ALPRAZolam (XANAX) 0.25 MG tablet Take 1 tablet (0.25 mg total) by mouth daily as needed for anxiety.    amLODipine (NORVASC) 10 MG tablet Take 1 tablet (10 mg total) by mouth daily. Please call and schedule your physical for Aug to receive additional refills.    Ascorbic Acid (VITAMIN C) 500 MG CAPS Take 500 mg by mouth daily.    benzonatate (TESSALON) 200 MG capsule Take 1 capsule (200 mg total) by mouth 3 (three) times daily as needed for cough.    Biotin 1000 MCG tablet Take 1,000 mcg by mouth daily.    cetirizine (ZYRTEC) 10 MG tablet Take 10 mg by mouth daily.    Cholecalciferol (VITAMIN D PO) Take 5,000 Units by mouth daily.    diclofenac (VOLTAREN) 75 MG EC tablet Take 75 mg by mouth 1 day or 1 dose.    Diclofenac Sodium 3 % GEL Reported on 05/05/2016 05/05/2016: Received from: External Pharmacy   dicyclomine (BENTYL) 10 MG capsule TAKE 1 CAPSULE(10 MG) BY MOUTH TWICE DAILY AS NEEDED FOR SPASMS    gabapentin (NEURONTIN) 100 MG capsule Take 100 mg by mouth at bedtime.    Garlic 1829 MG CAPS Take by mouth daily.    lansoprazole (PREVACID) 30 MG capsule TAKE 1 CAPSULE(30 MG) BY MOUTH AT BEDTIME    meclizine (ANTIVERT) 12.5 MG tablet Take 1 tablet (12.5 mg total) by mouth 3 (three) times daily as needed for dizziness.    ondansetron (ZOFRAN) 4 MG tablet 1 tablet by mouth every 6 to 8 hours as needed for nausea    valsartan (DIOVAN) 160 MG tablet TAKE 1 TABLET BY MOUTH  DAILY    No facility-administered encounter medications on file as of 05/07/2021.   Pharmacist  Review  Have you had any problems recently with your health? Patient states that she does not have any new health problems  Have you had any problems with your pharmacy? Patient states that she does not have any problems with getting medications or the cost of medications from the pharmacy  What issues or side effects are you having with your medications? Patient states that she does not have any side effects from medications   What would you like me to pass along to [facility] for them to help you with?  Patient states that she is doing really well and does not have any concerns about her health or medications at this time  What can we do to take care of you better?  Patient states that if she has any changes in health she will call Dr. Samuel [place] Rating Drugs: Valsartan 01/26/21 90 d  [place] Pharmacist Assistant [phone]   Time spent:20

## 2021-05-17 DIAGNOSIS — M25562 Pain in left knee: Secondary | ICD-10-CM | POA: Diagnosis not present

## 2021-05-27 DIAGNOSIS — M1712 Unilateral primary osteoarthritis, left knee: Secondary | ICD-10-CM | POA: Diagnosis not present

## 2021-06-03 DIAGNOSIS — M1712 Unilateral primary osteoarthritis, left knee: Secondary | ICD-10-CM | POA: Diagnosis not present

## 2021-06-10 DIAGNOSIS — M1712 Unilateral primary osteoarthritis, left knee: Secondary | ICD-10-CM | POA: Diagnosis not present

## 2021-06-17 ENCOUNTER — Ambulatory Visit: Payer: Medicare Other | Admitting: Podiatry

## 2021-06-23 ENCOUNTER — Other Ambulatory Visit: Payer: Self-pay | Admitting: Internal Medicine

## 2021-07-07 ENCOUNTER — Other Ambulatory Visit: Payer: Self-pay | Admitting: Physician Assistant

## 2021-07-26 ENCOUNTER — Other Ambulatory Visit: Payer: Self-pay | Admitting: Physician Assistant

## 2021-08-03 ENCOUNTER — Other Ambulatory Visit: Payer: Self-pay

## 2021-08-03 MED ORDER — DICYCLOMINE HCL 10 MG PO CAPS: ORAL_CAPSULE | ORAL | 1 refills | Status: DC

## 2021-08-11 ENCOUNTER — Other Ambulatory Visit: Payer: Self-pay | Admitting: Internal Medicine

## 2021-08-17 ENCOUNTER — Other Ambulatory Visit: Payer: Self-pay | Admitting: Internal Medicine

## 2021-08-17 ENCOUNTER — Encounter: Payer: Self-pay | Admitting: Internal Medicine

## 2021-08-17 MED ORDER — ALPRAZOLAM 0.25 MG PO TABS: 0.2500 mg | ORAL_TABLET | Freq: Every day | ORAL | 0 refills | Status: DC | PRN

## 2021-08-26 ENCOUNTER — Telehealth: Payer: Self-pay | Admitting: Pharmacist

## 2021-08-26 ENCOUNTER — Other Ambulatory Visit: Payer: Self-pay

## 2021-08-26 ENCOUNTER — Ambulatory Visit (INDEPENDENT_AMBULATORY_CARE_PROVIDER_SITE_OTHER): Payer: Medicare Other | Admitting: Pharmacist

## 2021-08-26 DIAGNOSIS — E782 Mixed hyperlipidemia: Secondary | ICD-10-CM

## 2021-08-26 DIAGNOSIS — I10 Essential (primary) hypertension: Secondary | ICD-10-CM

## 2021-08-26 DIAGNOSIS — F5101 Primary insomnia: Secondary | ICD-10-CM

## 2021-08-26 DIAGNOSIS — M160 Bilateral primary osteoarthritis of hip: Secondary | ICD-10-CM

## 2021-08-26 NOTE — Telephone Encounter (Signed)
  Chronic Care Management   Outreach Note  08/26/2021 Name: Gina Jefferson MRN: [id number] DOB: [birth year]  Referred by: Hoyt Koch, MD  Patient had a phone appointment scheduled with clinical pharmacist today.  An unsuccessful telephone outreach was attempted today. The patient was referred to the pharmacist for assistance with medications, care management and care coordination.   Patient will NOT be penalized in any way for missing a CCM appointment. The no-show fee does not apply.  If possible, a message was left to return call to: [phone] or to [facility]: [place], PharmD, Para [place], CPP Clinical Pharmacist [facility] at [facility] [phone]

## 2021-08-26 NOTE — Progress Notes (Cosign Needed)
Chronic Care Management Pharmacy Note  08/27/2021 Name:  Kashonda S Cedillo MRN:  [id number] DOB:  [birth year]  Summary: -Pt report she had Covid booster shot 08/19/21. She is planning to get Flu vaccine next Tuesday. -Pt reports she had mammogram and DEXA scan done Dec 2021 at [facility]. -Pt is overdue for PCP visit/physical. She reports pulse consistently in the 40s when she checks it occasionally, she denies symptoms related to this.  Recommendations/Changes made from today's visit: -Advised pt to schedule PCP visit/physical -Updated immunizations -Request records from [facility] for mammogram and DEXA   Subjective: Doninique S Dyk is an 70 y.o. year old female who is a primary patient of Hoyt Koch, MD.  The CCM team was consulted for assistance with disease management and care coordination needs.    Engaged with patient by telephone for follow up visit in response to provider referral for pharmacy case management and/or care coordination services.   Consent to Services:  The patient was given information about Chronic Care Management services, agreed to services, and gave verbal consent prior to initiation of services.  Please see initial visit note for detailed documentation.   Patient Care Team: Hoyt Koch, MD as PCP - General (Internal Medicine) Gatha Mayer, MD as Consulting Physician (Gastroenterology) Dian Queen, MD as Consulting Physician (Obstetrics and Gynecology) Almedia Balls, MD as Consulting Physician (Orthopedic Surgery) Jacelyn Pi, MD (Endocrinology) [name], [facility] as Pharmacist (Pharmacist)  Patient lives at home with her husband. She works for [employer].  Recent office visits: 06/29/20 Dr Sharlet Salina OV: chronic f/u, refilled meclizine prn, increased amlodipine to 10 mg due to BP above goal.  Recent consult visits: 03/18/21 Dr Sherryle Lis ([facility]): f/u tendon tear;  03/11/21 Dr Herbert Deaner  (ophthalmology): f/u dry eye  Hospital visits: None in previous 6 months   Objective:  Lab Results  Component Value Date   CREATININE 0.79 06/29/2020   BUN 12 06/29/2020   GFR 111.28 02/23/2018   GFRNONAA >90 11/23/2011   GFRAA >90 11/23/2011   NA 141 06/29/2020   K 4.0 06/29/2020   CALCIUM 9.7 06/29/2020   CO2 31 06/29/2020   GLUCOSE 96 06/29/2020    Lab Results  Component Value Date/Time   HGBA1C 5.9 (H) 06/29/2020 11:01 AM   HGBA1C 5.8 05/16/2019 12:00 AM   HGBA1C 6.0 02/21/2017 02:55 PM   GFR 111.28 02/23/2018 10:23 AM   GFR 115.54 02/21/2017 02:55 PM   MICROALBUR 0.1 01/21/2014 10:43 AM    Last diabetic Eye exam:  Lab Results  Component Value Date/Time   HMDIABEYEEXA No Retinopathy 03/16/2018 12:00 AM    Last diabetic Foot exam:  Lab Results  Component Value Date/Time   HMDIABFOOTEX  05/16/2013 12:00 AM    Done @ Dr. Herbert Deaner No diabetic retinopathy, no diabetic macular edema OD 20/25 OS 20/25     Lab Results  Component Value Date   CHOL 284 (H) 06/29/2020   HDL 52 06/29/2020   LDLCALC 201 (H) 06/29/2020   LDLDIRECT 175.0 02/21/2017   TRIG 148 06/29/2020   CHOLHDL 5.5 (H) 06/29/2020    Hepatic Function Latest Ref Rng & Units 06/29/2020 05/16/2019 02/23/2018  Total Protein 6.1 - 8.1 g/dL 7.4 - 7.7  Albumin 3.5 - 5.2 g/dL - - 4.0  AST 10 - 35 U/L 26 - 23  ALT 6 - 29 U/L 27 - 22  Alk Phosphatase 25 - 125 - 91 75  Total Bilirubin 0.2 - 1.2  mg/dL 0.6 - 0.9  Bilirubin, Direct 0.0 - 0.3 mg/dL - - -    Lab Results  Component Value Date/Time   TSH 0.81 06/22/2018 02:03 PM   TSH 1.51 02/18/2016 09:05 AM   FREET4 0.86 07/17/2009 02:16 PM   FREET4 0.8 09/12/2008 11:37 AM    CBC Latest Ref Rng & Units 06/29/2020 05/16/2019 02/23/2018  WBC 3.8 - 10.8 Thousand/uL 5.3 5.4 6.3  Hemoglobin 11.7 - 15.5 g/dL 14.3 13.9 13.9  Hematocrit 35.0 - 45.0 % 43.3 45 41.6  Platelets 140 - 400 Thousand/uL 228 193 226.0    Lab Results  Component Value Date/Time   VD25OH  15.97 (L) 02/13/2017 09:41 AM    Clinical ASCVD: No  The 10-year ASCVD risk score (Arnett DK, et al., 2019) is: 28.8%   Values used to calculate the score:     Age: 70 years     Sex: Female     Is Non-Hispanic African American: Yes     Diabetic: Yes     Tobacco smoker: No     Systolic Blood Pressure: 355 mmHg     Is BP treated: Yes     HDL Cholesterol: 52 mg/dL     Total Cholesterol: 284 mg/dL    Depression screen [facility] 2/9 06/29/2020 06/28/2019 02/23/2018  Decreased Interest 0 0 0  Down, Depressed, Hopeless 0 0 0  PHQ - 2 Score 0 0 0  Altered sleeping - - 3  Tired, decreased energy - - 0  Change in appetite - - 0  Feeling bad or failure about yourself  - - 0  Trouble concentrating - - 0  Moving slowly or fidgety/restless - - 0  Suicidal thoughts - - 0  PHQ-9 Score - - 3  Difficult doing work/chores - - Not difficult at all     Social History   Tobacco Use  Smoking Status Former   Types: Cigarettes   Quit date: 11/29/2003   Years since quitting: 17.7  Smokeless Tobacco Never   BP Readings from Last 3 Encounters:  06/29/20 (!) 144/88  12/25/19 (!) 144/86  06/28/19 140/82   Pulse Readings from Last 3 Encounters:  06/29/20 46  12/25/19 96  06/28/19 86   Wt Readings from Last 3 Encounters:  06/29/20 158 lb (71.7 kg)  12/25/19 160 lb (72.6 kg)  06/28/19 163 lb (73.9 kg)   BMI Readings from Last 3 Encounters:  06/29/20 29.85 kg/m  12/25/19 30.23 kg/m  06/28/19 30.80 kg/m    Assessment/Interventions: Review of patient past medical history, allergies, medications, health status, including review of consultants reports, laboratory and other test data, was performed as part of comprehensive evaluation and provision of chronic care management services.   SDOH:  (Social Determinants of Health) assessments and interventions performed: Yes  SDOH Screenings   Alcohol Screen: Not on file  Depression (PHQ2-9): Not on file  Financial Resource Strain: Low Risk     Difficulty of Paying Living Expenses: Not hard at all  Food Insecurity: Not on file  Housing: Not on file  Physical Activity: Not on file  Social Connections: Not on file  Stress: Not on file  Tobacco Use: Medium Risk   Smoking Tobacco Use: Former   Smokeless Tobacco Use: Never  Transportation Needs: Not on file    [place]  Allergies  Allergen Reactions   Clindamycin/Lincomycin Rash   Ciprofloxacin Rash   Penicillins Rash   Statins Other (See Comments)    Muscle aches   Metformin Diarrhea  and Other (See Comments)    Vomiting     Medications Reviewed Today     Reviewed by [name], [facility] (Pharmacist) on 08/26/21 at 1328  Med List Status: <None>   Medication Order Taking? Sig Documenting Provider Last Dose Status Informant  ALPRAZolam (XANAX) 0.25 MG tablet [id number] Yes Take 1 tablet (0.25 mg total) by mouth daily as needed for anxiety. Binnie Rail, MD Taking Active   amLODipine (NORVASC) 10 MG tablet [id number] Yes Take 1 tablet (10 mg total) by mouth daily. Please call our office to schedule a follow up to receive additional refills. Hoyt Koch, MD Taking Active   Ascorbic Acid (VITAMIN C) 500 MG CAPS [id number] Yes Take 500 mg by mouth daily. [provider] Taking Active   benzonatate (TESSALON) 200 MG capsule [id number] Yes Take 1 capsule (200 mg total) by mouth 3 (three) times daily as needed for cough. Hoyt Koch, MD Taking Active   Biotin 1000 MCG tablet [id number] Yes Take 1,000 mcg by mouth daily. [provider] Taking Active   cetirizine (ZYRTEC) 10 MG tablet [id number] Yes Take 10 mg by mouth daily. [provider] Taking Active   Cholecalciferol (VITAMIN D PO) [id number] Yes Take 5,000 Units by mouth daily. [provider] Taking Active Self  diclofenac (VOLTAREN) 50 MG EC tablet [id number] Yes Take 50 mg by mouth daily as needed. [provider] Taking Active   Diclofenac Sodium 3 % GEL  [id number] Yes Reported on 05/05/2016 [provider] Taking Active            Med Note Rolan Bucco Aug 26, 2021  1:27 PM)    dicyclomine (BENTYL) 10 MG capsule [id number] Yes TAKE 1 CAPSULE(10 MG) BY MOUTH TWICE DAILY AS NEEDED FOR SPASMS E[place]wood, Amy S, PA-C Taking Active   gabapentin (NEURONTIN) 100 MG capsule [id number]  Take 100 mg by mouth at bedtime. [provider]  Expired 60/67/70 3403   Garlic 5248 MG CAPS [id number] Yes Take by mouth daily. [provider] Taking Active   lansoprazole (PREVACID) 30 MG capsule [id number] Yes TAKE 1 CAPSULE(30 MG) BY MOUTH AT BEDTIME Esterwood, Amy S, PA-C Taking Active   meclizine (ANTIVERT) 12.5 MG tablet [id number] Yes Take 1 tablet (12.5 mg total) by mouth 3 (three) times daily as needed for dizziness. Hoyt Koch, MD Taking Active   ondansetron [facility]) 4 MG tablet [id number] Yes 1 tablet by mouth every 6 to 8 hours as needed for nausea Alfredia Ferguson, PA-C Taking Active   valsartan (DIOVAN) 160 MG tablet [id number] Yes TAKE 1 TABLET BY MOUTH  DAILY Hoyt Koch, MD Taking Active             Patient Active Problem List   Diagnosis Date Noted   Neck pain on left side 05/21/2018   Vertigo 12/11/2015   Routine general medical examination at a health care facility 02/12/2015   Impaired fasting blood sugar 01/21/2011   Allergic rhinitis 09/24/2010   Hyperlipidemia 06/23/2009   Essential hypertension 06/23/2009   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    Immunization History  Administered Date(s) Administered   Fluad Quad(high Dose 65+) 08/21/2019, 08/27/2020   Influenza Split 08/28/2012   Influenza Whole 08/08/2005, 08/19/2008, 09/28/2009, 09/28/2010   Influenza,inj,Quad PF,6+ Mos 08/28/2013, 09/11/2014, 09/06/2016   Influenza-Unspecified 08/12/2013, 09/12/2015, 09/07/2017, 09/11/2018, 04/09/2020   PFIZER(Purple Top)SARS-COV-2 Vaccination 12/07/2019, 12/26/2019, 09/18/2020   Pfizer Covid-19  Vaccine Bivalent Booster 67yr & up 08/19/2021   Pneumococcal Conjugate-13  02/21/2017   Pneumococcal Polysaccharide-23 01/16/2006, 02/23/2018   Td 01/17/2005   Tdap 02/27/2015   Zoster, Live 01/15/2013    Conditions to be addressed/monitored:  Hypertension, Hyperlipidemia, Anxiety, and Osteoarthritis  Care Plan : [place]  Updates made by [name], [place] since 08/27/2021 12:00 AM     Problem: Hypertension, Hyperlipidemia, Anxiety, and Osteoarthritis   Priority: High     Long-Range Goal: Disease management   Start Date: 12/28/2020  Expected End Date: 08/27/2022  This Visit's Progress: On track  Recent Progress: On track  Priority: High  Note:   Current Barriers:  Unable to independently monitor therapeutic efficacy Unable to achieve control of cholesterol   Pharmacist Clinical Goal(s):  Patient will achieve adherence to monitoring guidelines and medication adherence to achieve therapeutic efficacy achieve control of LDL as evidenced by laboratory improvement through collaboration with PharmD and provider.   Interventions: 1:1 collaboration with Hoyt Koch, MD regarding development and update of comprehensive plan of care as evidenced by provider attestation and co-signature Inter-disciplinary care team collaboration (see longitudinal plan of care) Comprehensive medication review performed; medication list updated in electronic medical record  Hypertension (BP goal <130/80) -Controlled - pt monitors occasionally at home -Current home readings: 118/62, pulse 40s-50s -Current treatment: Valsartan 160 mg daily Amlodipine 10 mg daily -Discussed low pulse at home, pt reports has been an issues since the summer, however she denies any symptoms related to this. Pulse was 46 at last PCP visit and no interventions noted. As long as she is asymptomatic she may keep regular annual follow up with PCP. Advised to contact PCP earlier if she experiences  lightheadedness, dizziness, extreme fatigue -Recommend to continue current medication  Hyperlipidemia: (LDL goal < 130) -Uncontrolled - ASCVD risk high (27%). Pt has tried and failed multiple statins and is unwilling to take cholesterol medication at this time -Current treatment: No medication -Medications previously tried: "statins" - muscle aches; atorvastatin, simvastatin, rosuvastatin, pravastatin, ezetimibe -Current dietary patterns: avoids high-cholesterol foods -Current exercise habits: Wii exercies, gardening;  -Educated on Cholesterol goals; Benefits of statin for ASCVD risk reduction; Importance of limiting foods high in cholesterol; Exercise goal of 150 minutes per week; -Counseled on diet and exercise extensively  Anxiety/Insomnia (Goal: manage symptoms) -Controlled - per pt report. She lost her son 12 years ago and certain times of year are harder than others, she very rarely needs medication -Current treatment: Alprazolam 0.25 mg daily PRN (#10) -Medications previously tried/failed: sertraline, trazodone, amitriptyline, ramelteon, zolpidem  -Recommended to continue current medication  Arthritis / Neck pain (Goal: manage symptoms) -Controlled - per pt report, much improved since cortisone injection 2 weeks ago -Current treatment  Diclofenac 50 mg PRN  Voltaren gel PRN Gabapentin 100 mg HS x 2 wks (temporary from ortho) -Recommended to continue current medication  Health maintenance -Vaccine gaps: Shingrix, flu -Pt reports she had covid booster last week. She is planning to get flu vaccine next week. Advised to get Shingrix as well.  Patient Goals/Self-Care Activities Patient will:  -take medications as prescribed -target a minimum of 150 minutes of moderate intensity exercise weekly and  -engage in dietary modifications by reducing cholesterol in diet -Make annual appt with Dr Sharlet Salina -Get Flu and Shingrix vaccines      Medication Assistance: None required.   Patient affirms current coverage meets needs.  Compliance/Adherence/Medication fill history: Care Gaps: Mammogram (10/01/20) -Pt reports she had mammogram done Dec 2021 at [facility], along with DEXA scan. Will request records.  Star-Rating Drugs: Valsartan - LF 08/11/21 x 90 ds  Patient's preferred pharmacy is:  [facility] [place], [place] [address] Phone: [phone] Fax: [phone]  [facility]  ([place]) - [place], [place] [facility] [address] [place] [address] [place] [address] Phone: [phone] Fax: [phone]  Uses pill box? No - prefers bottles Pt endorses 100% compliance  We discussed: Current pharmacy is preferred with insurance plan and patient is satisfied with pharmacy services Patient decided to: Continue current medication management strategy  Care Plan and Follow Up Patient Decision:  Patient agrees to Care Plan and Follow-up.  Plan: Telephone follow up appointment with care management team member scheduled for:  1 year  Charlene Brooke, PharmD, [place], CPP Clinical Pharmacist [facility] [phone]

## 2021-08-27 DIAGNOSIS — E782 Mixed hyperlipidemia: Secondary | ICD-10-CM

## 2021-08-27 DIAGNOSIS — I10 Essential (primary) hypertension: Secondary | ICD-10-CM | POA: Diagnosis not present

## 2021-08-27 DIAGNOSIS — M160 Bilateral primary osteoarthritis of hip: Secondary | ICD-10-CM

## 2021-08-27 NOTE — Progress Notes (Signed)
Called [facility] to request a copy of mammogram and DEXA scan, spoke with medical records and they will fax over today.  Orinda Kenner, [place] Clinical Pharmacists Assistant [phone]  Time Spent: [phone]

## 2021-08-27 NOTE — Patient Instructions (Signed)
Visit Information  Phone number for Pharmacist: [phone]   Goals Addressed             This Visit's Progress    Track and Manage My Blood Pressure-Hypertension       Timeframe:  Long-Range Goal Priority:  High Start Date:      12/28/20                       Expected End Date:    08/27/22              - check blood pressure 3 times per week - choose a place to take my blood pressure (home, clinic or office, retail store) - write blood pressure results in a log or diary  -monitor pulse and monitor for any symptoms related to bradycardia - lightheadedness, dizziness, fatigue, syncope   Why is this important?   You won't feel high blood pressure, but it can still hurt your blood vessels.  High blood pressure can cause heart or kidney problems. It can also cause a stroke.  Making lifestyle changes like losing a little weight or eating less salt will help.  Checking your blood pressure at home and at different times of the day can help to control blood pressure.  If the doctor prescribes medicine remember to take it the way the doctor ordered.  Call the office if you cannot afford the medicine or if there are questions about it.           Care Plan : [place]  Updates made by [name], RPH since 08/27/2021 12:00 AM     Problem: Hypertension, Hyperlipidemia, Anxiety, and Osteoarthritis   Priority: High     Long-Range Goal: Disease management   Start Date: 12/28/2020  Expected End Date: 08/27/2022  This Visit's Progress: On track  Recent Progress: On track  Priority: High  Note:   Current Barriers:  Unable to independently monitor therapeutic efficacy Unable to achieve control of cholesterol   Pharmacist Clinical Goal(s):  Patient will achieve adherence to monitoring guidelines and medication adherence to achieve therapeutic efficacy achieve control of LDL as evidenced by laboratory improvement through collaboration with PharmD and provider.    Interventions: 1:1 collaboration with Hoyt Koch, MD regarding development and update of comprehensive plan of care as evidenced by provider attestation and co-signature Inter-disciplinary care team collaboration (see longitudinal plan of care) Comprehensive medication review performed; medication list updated in electronic medical record  Hypertension (BP goal <130/80) -Controlled - pt monitors occasionally at home -Current home readings: 118/62, pulse 40s-50s -Current treatment: Valsartan 160 mg daily Amlodipine 10 mg daily -Discussed low pulse at home, pt reports has been an issues since the summer, however she denies any symptoms related to this. Pulse was 46 at last PCP visit and no interventions noted. As long as she is asymptomatic she may keep regular annual follow up with PCP. Advised to contact PCP earlier if she experiences lightheadedness, dizziness, extreme fatigue -Recommend to continue current medication  Hyperlipidemia: (LDL goal < 130) -Uncontrolled - ASCVD risk high (27%). Pt has tried and failed multiple statins and is unwilling to take cholesterol medication at this time -Current treatment: No medication -Medications previously tried: "statins" - muscle aches; atorvastatin, simvastatin, rosuvastatin, pravastatin, ezetimibe -Current dietary patterns: avoids high-cholesterol foods -Current exercise habits: Wii exercies, gardening;  -Educated on Cholesterol goals; Benefits of statin for ASCVD risk reduction; Importance of limiting foods high in cholesterol; Exercise goal  of 150 minutes per week; -Counseled on diet and exercise extensively  Anxiety/Insomnia (Goal: manage symptoms) -Controlled - per pt report. She lost her son 12 years ago and certain times of year are harder than others, she very rarely needs medication -Current treatment: Alprazolam 0.25 mg daily PRN (#10) -Medications previously tried/failed: sertraline, trazodone, amitriptyline,  ramelteon, zolpidem  -Recommended to continue current medication  Arthritis / Neck pain (Goal: manage symptoms) -Controlled - per pt report, much improved since cortisone injection 2 weeks ago -Current treatment  Diclofenac 50 mg PRN  Voltaren gel PRN Gabapentin 100 mg HS x 2 wks (temporary from ortho) -Recommended to continue current medication  Health maintenance -Vaccine gaps: Shingrix, flu -Pt reports she had covid booster last week. She is planning to get flu vaccine next week. Advised to get Shingrix as well.  Patient Goals/Self-Care Activities Patient will:  -take medications as prescribed -target a minimum of 150 minutes of moderate intensity exercise weekly and  -engage in dietary modifications by reducing cholesterol in diet -Make annual appt with Dr Sharlet Salina -Get Flu and Shingrix vaccines      Patient verbalizes understanding of instructions provided today and agrees to view in [place].  Telephone follow up appointment with pharmacy team member scheduled for: 1 year  Charlene Brooke, PharmD, [place], CPP Clinical Pharmacist [place] [facility] at [facility] [phone]

## 2021-09-07 ENCOUNTER — Encounter: Payer: Medicare Other | Admitting: Internal Medicine

## 2021-09-13 ENCOUNTER — Other Ambulatory Visit: Payer: Self-pay

## 2021-09-13 ENCOUNTER — Ambulatory Visit (INDEPENDENT_AMBULATORY_CARE_PROVIDER_SITE_OTHER): Payer: Medicare Other | Admitting: Internal Medicine

## 2021-09-13 ENCOUNTER — Encounter: Payer: Self-pay | Admitting: Internal Medicine

## 2021-09-13 VITALS — BP 128/90 | HR 104 | Resp 18 | Ht 61.0 in | Wt 158.4 lb

## 2021-09-13 DIAGNOSIS — Z0000 Encounter for general adult medical examination without abnormal findings: Secondary | ICD-10-CM

## 2021-09-13 DIAGNOSIS — R7301 Impaired fasting glucose: Secondary | ICD-10-CM | POA: Diagnosis not present

## 2021-09-13 DIAGNOSIS — K219 Gastro-esophageal reflux disease without esophagitis: Secondary | ICD-10-CM

## 2021-09-13 DIAGNOSIS — E010 Iodine-deficiency related diffuse (endemic) goiter: Secondary | ICD-10-CM

## 2021-09-13 DIAGNOSIS — E782 Mixed hyperlipidemia: Secondary | ICD-10-CM | POA: Diagnosis not present

## 2021-09-13 DIAGNOSIS — I10 Essential (primary) hypertension: Secondary | ICD-10-CM

## 2021-09-13 LAB — CBC
HCT: 40.8 % (ref 36.0–46.0)
Hemoglobin: 13.5 g/dL (ref 12.0–15.0)
MCHC: 33.2 g/dL (ref 30.0–36.0)
MCV: 96.4 fl (ref 78.0–100.0)
Platelets: 227 10*3/uL (ref 150.0–400.0)
RBC: 4.23 Mil/uL (ref 3.87–5.11)
RDW: 13.7 % (ref 11.5–15.5)
WBC: 8.8 10*3/uL (ref 4.0–10.5)

## 2021-09-13 LAB — HEMOGLOBIN A1C: Hgb A1c MFr Bld: 6.2 % (ref 4.6–6.5)

## 2021-09-13 LAB — LIPID PANEL
Cholesterol: 256 mg/dL — ABNORMAL HIGH (ref 0–200)
HDL: 47.2 mg/dL (ref >39.00)
NonHDL: 208.35
Total CHOL/HDL Ratio: 5
Triglycerides: 330 mg/dL — ABNORMAL HIGH (ref 0.0–149.0)
VLDL: 66 mg/dL — ABNORMAL HIGH (ref 0.0–40.0)

## 2021-09-13 LAB — COMPREHENSIVE METABOLIC PANEL
ALT: 27 U/L (ref 0–35)
AST: 24 U/L (ref 0–37)
Albumin: 4.1 g/dL (ref 3.5–5.2)
Alkaline Phosphatase: 81 U/L (ref 39–117)
BUN: 12 mg/dL (ref 6–23)
CO2: 29 mEq/L (ref 19–32)
Calcium: 9.3 mg/dL (ref 8.4–10.5)
Chloride: 105 mEq/L (ref 96–112)
Creatinine, Ser: 0.74 mg/dL (ref 0.40–1.20)
GFR: 82.39 mL/min (ref >60.00)
Glucose, Bld: 137 mg/dL — ABNORMAL HIGH (ref 70–99)
Potassium: 3.5 mEq/L (ref 3.5–5.1)
Sodium: 140 mEq/L (ref 135–145)
Total Bilirubin: 0.5 mg/dL (ref 0.2–1.2)
Total Protein: 7.1 g/dL (ref 6.0–8.3)

## 2021-09-13 LAB — TSH: TSH: 0.89 u[IU]/mL (ref 0.35–5.50)

## 2021-09-13 LAB — LDL CHOLESTEROL, DIRECT: Direct LDL: 175 mg/dL

## 2021-09-13 MED ORDER — AMLODIPINE BESYLATE 10 MG PO TABS
10.0000 mg | ORAL_TABLET | Freq: Every day | ORAL | 3 refills | Status: DC
Start: 2021-09-13 — End: 2022-01-20

## 2021-09-13 MED ORDER — VALSARTAN 160 MG PO TABS: 160.0000 mg | ORAL_TABLET | Freq: Every day | ORAL | 3 refills | Status: DC

## 2021-09-13 NOTE — Patient Instructions (Signed)
Ask the pharmacist about the shingles vaccine.

## 2021-09-13 NOTE — Progress Notes (Signed)
Subjective:   Patient ID: Gina Jefferson, female    DOB: [birth year], 70 y.o.   MRN: [id number]  HPI Here for medicare wellness and physical, no new complaints. Please see A/P for status and treatment of chronic medical problems.   Diet: heart healthy Physical activity: sedentary Depression/mood screen: negative Hearing: intact to whispered voice, mild loss with tinnitus bilaterally Visual acuity: grossly normal, performs annual eye exam  ADLs: capable Fall risk: none Home safety: good Cognitive evaluation: intact to orientation, naming, recall and repetition EOL planning: adv directives discussed  [place] Visit from 09/13/2021 in [place] at [facility]  PHQ-2 Total Score 0       [place] from 02/23/2018 in [facility]  PHQ-9 Total Score 3      Fall Risk  09/13/2021 06/29/2020 06/28/2019 06/22/2018 02/23/2018  Falls in the past year? 0 0 0 No No  Number falls in past yr: 0 - - - -  Injury with Fall? 0 - - - -    I have personally reviewed and have noted 1. The patient's medical and social history - reviewed today no changes 2. Their use of alcohol, tobacco or illicit drugs 3. Their current medications and supplements 4. The patient's functional ability including ADL's, fall risks, home safety risks and hearing or visual impairment. 5. Diet and physical activities 6. Evidence for depression or mood disorders 7. Care team reviewed and updated 8.  The patient is not on an opioid pain medication.  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 x 15 yrs.   Seasonal allergies    Past Surgical History:  Procedure Laterality Date   ABDOMINAL HYSTERECTOMY     complete   APPENDECTOMY     BREAST REDUCTION SURGERY  2016   CARPAL TUNNEL RELEASE  11/24/2011   Procedure: CARPAL TUNNEL RELEASE;  Surgeon: Garald Balding, MD;  Location: [place];  Service: Orthopedics;  Laterality: Right;  CARPAL TUNNEL REL ON RIGHT    CATARACT EXTRACTION, BILATERAL Bilateral 2018   CESAREAN SECTION     CHOLECYSTECTOMY     COLONOSCOPY     OOPHORECTOMY     OVARIAN CYST REMOVAL     POLYPECTOMY     TONSILLECTOMY     WISDOM TOOTH EXTRACTION     Family History  Problem Relation Age of Onset   Diabetes Mother    Stroke Mother    Heart disease Mother    Hyperlipidemia Mother    Hypertension Mother    Stroke Father    Alzheimer's disease Father    Mental illness Father    Hypertension Father    Hyperlipidemia Father    Cancer Maternal Grandfather        stomach cancer   Colon cancer Neg Hx    Review of Systems  Constitutional: Negative.  HENT: Negative.    Eyes: Negative.   Respiratory:  Negative for cough, chest tightness and shortness of breath.   Cardiovascular:  Negative for chest pain, palpitations and leg swelling.  Gastrointestinal:  Negative for abdominal distention, abdominal pain, constipation, diarrhea, nausea and vomiting.  Musculoskeletal: Negative.   Skin: Negative.   Neurological: Negative.   Psychiatric/Behavioral: Negative.     Objective:  Physical Exam Constitutional:      Appearance: She is well-developed.  HENT:     Head: Normocephalic and atraumatic.  Cardiovascular:     Rate and Rhythm: Normal rate and regular rhythm.  Pulmonary:     Effort:  Pulmonary effort is normal. No respiratory distress.     Breath sounds: Normal breath sounds. No wheezing or rales.  Abdominal:     General: Bowel sounds are normal. There is no distension.     Palpations: Abdomen is soft.     Tenderness: There is no abdominal tenderness. There is no rebound.  Musculoskeletal:     Cervical back: Normal range of motion.  Skin:    General: Skin is warm and dry.  Neurological:     Mental Status: She is alert and oriented to person, place, and time.     Coordination: Coordination normal.    Vitals:   09/13/21 1400  BP: 128/90  Pulse: (!) 104  Resp: 18  SpO2: 94%  Weight: 158 lb 6.4 oz (71.8 kg)  Height: 5\' 1"  (1.549 m)   This visit occurred during the SARS-CoV-2 public health emergency.  Safety protocols were in place, including screening questions prior to the visit, additional usage of staff PPE, and extensive cleaning of exam room while observing appropriate contact time as indicated for disinfecting solutions.   Assessment & Plan:

## 2021-09-14 ENCOUNTER — Encounter: Payer: Self-pay | Admitting: Internal Medicine

## 2021-09-15 MED ORDER — MONTELUKAST SODIUM 10 MG PO TABS: 10.0000 mg | ORAL_TABLET | Freq: Every day | ORAL | 3 refills | Status: DC

## 2021-09-15 MED ORDER — TRAZODONE HCL 50 MG PO TABS: 25.0000 mg | ORAL_TABLET | Freq: Every evening | ORAL | 3 refills | Status: DC | PRN

## 2021-09-16 MED ORDER — ICOSAPENT ETHYL 1 G PO CAPS: 2.0000 g | ORAL_CAPSULE | Freq: Two times a day (BID) | ORAL | 6 refills | Status: DC

## 2021-09-17 NOTE — Assessment & Plan Note (Signed)
Not on meds currently, checking lipid panel and adjust as needed.

## 2021-09-17 NOTE — Assessment & Plan Note (Signed)
BP at goal on current regimen valsartan 160 mg daily and amlodipine 10 mg daily. Checking CMP and adjust as needed otherwise continue.

## 2021-09-17 NOTE — Assessment & Plan Note (Signed)
Checking HgA1c and adjust as needed.  

## 2021-09-17 NOTE — Assessment & Plan Note (Signed)
Taking prevacid 30 mg daily and symptoms are adequately controlled.

## 2021-09-17 NOTE — Assessment & Plan Note (Signed)
Flu shot complete for season. Covid-19 counseled about booster. Pneumonia complete. Shingrix complete. Tetanus due 2026. Colonoscopy due 2026. Mammogram due declines, pap smear aged out and dexa due 2026. Counseled about sun safety and mole surveillance. Counseled about the dangers of distracted driving. Given 10 year screening recommendations.

## 2021-09-17 NOTE — Assessment & Plan Note (Signed)
Checking TSH and adjust as needed. No symptoms of high or low thyroid levels.

## 2021-11-04 ENCOUNTER — Encounter: Payer: Self-pay | Admitting: Internal Medicine

## 2021-11-04 DIAGNOSIS — H16223 Keratoconjunctivitis sicca, not specified as Sjogren's, bilateral: Secondary | ICD-10-CM | POA: Diagnosis not present

## 2021-11-04 DIAGNOSIS — H40013 Open angle with borderline findings, low risk, bilateral: Secondary | ICD-10-CM | POA: Diagnosis not present

## 2021-11-04 DIAGNOSIS — H04123 Dry eye syndrome of bilateral lacrimal glands: Secondary | ICD-10-CM | POA: Diagnosis not present

## 2021-11-05 ENCOUNTER — Telehealth (INDEPENDENT_AMBULATORY_CARE_PROVIDER_SITE_OTHER): Payer: Medicare Other | Admitting: Internal Medicine

## 2021-11-05 ENCOUNTER — Other Ambulatory Visit: Payer: Self-pay

## 2021-11-05 ENCOUNTER — Encounter: Payer: Self-pay | Admitting: Internal Medicine

## 2021-11-05 DIAGNOSIS — U071 COVID-19: Secondary | ICD-10-CM | POA: Insufficient documentation

## 2021-11-05 MED ORDER — PROMETHAZINE-DM 6.25-15 MG/5ML PO SYRP: 5.0000 mL | ORAL_SOLUTION | Freq: Four times a day (QID) | ORAL | 0 refills | Status: DC | PRN

## 2021-11-05 MED ORDER — MOLNUPIRAVIR EUA 200MG CAPSULE: 4.0000 | ORAL_CAPSULE | Freq: Two times a day (BID) | ORAL | 0 refills | Status: AC

## 2021-11-05 NOTE — Assessment & Plan Note (Signed)
Rx molnupiravir and promethazine/dm cough syrup. Advised of 5 day quarantine period.

## 2021-11-05 NOTE — Progress Notes (Signed)
Virtual Visit via Audio Note  I connected with Nihal S Yamamoto on 11/05/21 at  1:40 PM EST by an audio-only enabled telemedicine application and verified that I am speaking with the correct person using two identifiers.  The patient and the provider were at separate locations throughout the entire encounter. Patient location: home, Provider location: work   I discussed the limitations of evaluation and management by telemedicine and the availability of in person appointments. The patient expressed understanding and agreed to proceed. The patient and the provider were the only parties present for the visit unless noted in HPI below.  History of Present Illness: The patient is a 70 y.o. female with visit for covid-19. Started yesterday.  Observations/Objective: A and O times 3, some cough during visit  Assessment and Plan: See problem oriented charting  Follow Up Instructions: Rx molnupiravir and promethazine/dm  Visit time 5 minutes in non-face to face communication with patient and coordination of care  I discussed the assessment and treatment plan with the patient. The patient was provided an opportunity to ask questions and all were answered. The patient agreed with the plan and demonstrated an understanding of the instructions.   The patient was advised to call back or seek an in-person evaluation if the symptoms worsen or if the condition fails to improve as anticipated.  Hoyt Koch, MD

## 2021-11-11 ENCOUNTER — Telehealth: Payer: Self-pay

## 2021-11-11 NOTE — Chronic Care Management (AMB) (Signed)
° ° °  Chronic Care Management Pharmacy Assistant   Name: Gina Jefferson  MRN: [id number] DOB: [birth year]   Reason for Encounter: Disease State-General   Recent office visits:  09/13/21 Hoyt Koch, MD-PCP (Annual Exam) Blood work was ordered. Medication changes: none  Recent consult visits:  None ID  Hospital visits:  None in previous 6 months  Medications: Outpatient Encounter Medications as of 11/11/2021  Medication Sig   ALPRAZolam (XANAX) 0.25 MG tablet Take 1 tablet (0.25 mg total) by mouth daily as needed for anxiety.   amLODipine (NORVASC) 10 MG tablet Take 1 tablet (10 mg total) by mouth daily.   Ascorbic Acid (VITAMIN C) 500 MG CAPS Take 500 mg by mouth daily.   Biotin 1000 MCG tablet Take 1,000 mcg by mouth daily.   cetirizine (ZYRTEC) 10 MG tablet Take 10 mg by mouth daily.   Cholecalciferol (VITAMIN D PO) Take 5,000 Units by mouth daily.   diclofenac (VOLTAREN) 50 MG EC tablet Take 50 mg by mouth daily as needed.   Diclofenac Sodium 3 % GEL Reported on 05/05/2016   dicyclomine (BENTYL) 10 MG capsule TAKE 1 CAPSULE(10 MG) BY MOUTH TWICE DAILY AS NEEDED FOR SPASMS   Garlic 4944 MG CAPS Take by mouth daily.   lansoprazole (PREVACID) 30 MG capsule TAKE 1 CAPSULE(30 MG) BY MOUTH AT BEDTIME   meclizine (ANTIVERT) 12.5 MG tablet Take 1 tablet (12.5 mg total) by mouth 3 (three) times daily as needed for dizziness.   montelukast (SINGULAIR) 10 MG tablet Take 1 tablet (10 mg total) by mouth at bedtime.   ondansetron (ZOFRAN) 4 MG tablet 1 tablet by mouth every 6 to 8 hours as needed for nausea   promethazine-dextromethorphan (PROMETHAZINE-DM) 6.25-15 MG/5ML syrup Take 5 mLs by mouth 4 (four) times daily as needed for cough.   traZODone (DESYREL) 50 MG tablet Take 0.5-1 tablets (25-50 mg total) by mouth at bedtime as needed for sleep.   valsartan (DIOVAN) 160 MG tablet Take 1 tablet (160 mg total) by mouth daily.   No facility-administered encounter medications on file as  of 11/11/2021.   Have you had any problems recently with your health? Patient states she just got over covid but is feeling better now  Have you had any problems with your pharmacy?Patient states that she does not have any problems with getting medications or the cost of medications from the pharmacy  What issues or side effects are you having with your medications? Patient states that she does not have any side effects from medications  What would you like me to pass along to [facility] for them to help you with? Patient states she is doing well  What can we do to take care of you better? Patient states she does not need anything at this time  Care Gaps: Colonoscopy-07/20/15 Diabetic Foot Exam-NA Mammogram-10/11/18 Ophthalmology-NA Dexa Scan - NA Annual Well Visit - 09/13/21 Micro albumin-NA Hemoglobin A1c- 09/13/21  Star Rating Drugs: Valsartan 160 mg-last fill 08/11/21 90 ds  Ethelene Hal Clinical Pharmacist Assistant [phone]

## 2021-11-19 ENCOUNTER — Other Ambulatory Visit: Payer: Self-pay | Admitting: Physician Assistant

## 2021-12-23 DIAGNOSIS — Z1231 Encounter for screening mammogram for malignant neoplasm of breast: Secondary | ICD-10-CM | POA: Diagnosis not present

## 2021-12-29 ENCOUNTER — Other Ambulatory Visit: Payer: Self-pay | Admitting: Internal Medicine

## 2022-01-03 DIAGNOSIS — M1611 Unilateral primary osteoarthritis, right hip: Secondary | ICD-10-CM | POA: Diagnosis not present

## 2022-01-05 ENCOUNTER — Encounter: Payer: Self-pay | Admitting: Internal Medicine

## 2022-01-07 DIAGNOSIS — M1611 Unilateral primary osteoarthritis, right hip: Secondary | ICD-10-CM | POA: Diagnosis not present

## 2022-01-20 ENCOUNTER — Encounter: Payer: Self-pay | Admitting: Internal Medicine

## 2022-01-20 ENCOUNTER — Ambulatory Visit (INDEPENDENT_AMBULATORY_CARE_PROVIDER_SITE_OTHER): Payer: Medicare Other | Admitting: Internal Medicine

## 2022-01-20 ENCOUNTER — Other Ambulatory Visit: Payer: Self-pay

## 2022-01-20 DIAGNOSIS — I10 Essential (primary) hypertension: Secondary | ICD-10-CM

## 2022-01-20 NOTE — Patient Instructions (Signed)
We will have you watch the blood pressure 1-2 times per week.

## 2022-01-20 NOTE — Progress Notes (Signed)
° °  Subjective:   Patient ID: Gina Jefferson, female    DOB: Jun 25, 1951, 71 y.o.   MRN: [id number]  HPI The patient is a 71 YO female coming in for follow up BP. Stopped amlodipine.   Review of Systems  Constitutional: Negative.   HENT: Negative.    Eyes: Negative.   Respiratory:  Negative for cough, chest tightness and shortness of breath.   Cardiovascular:  Negative for chest pain, palpitations and leg swelling.  Gastrointestinal:  Negative for abdominal distention, abdominal pain, constipation, diarrhea, nausea and vomiting.  Musculoskeletal: Negative.   Skin: Negative.   Neurological: Negative.   Psychiatric/Behavioral: Negative.     Objective:  Physical Exam Constitutional:      Appearance: She is well-developed.  HENT:     Head: Normocephalic and atraumatic.  Cardiovascular:     Rate and Rhythm: Normal rate and regular rhythm.  Pulmonary:     Effort: Pulmonary effort is normal. No respiratory distress.     Breath sounds: Normal breath sounds. No wheezing or rales.  Abdominal:     General: Bowel sounds are normal. There is no distension.     Palpations: Abdomen is soft.     Tenderness: There is no abdominal tenderness. There is no rebound.  Musculoskeletal:     Cervical back: Normal range of motion.  Skin:    General: Skin is warm and dry.  Neurological:     Mental Status: She is alert and oriented to person, place, and time.     Coordination: Coordination normal.    Vitals:   01/20/22 0847 01/20/22 0900  BP: (!) 170/90 (!) 170/96  Pulse: (!) 54   Resp: 18   Temp: 98.2 F (36.8 C)   SpO2: 98%   Weight: 152 lb (68.9 kg)   Height: 5\' 1"  (1.549 m)     This visit occurred during the SARS-CoV-2 public health emergency.  Safety protocols were in place, including screening questions prior to the visit, additional usage of staff PPE, and extensive cleaning of exam room while observing appropriate contact time as indicated for disinfecting solutions.   Assessment &  Plan:

## 2022-01-21 DIAGNOSIS — M25572 Pain in left ankle and joints of left foot: Secondary | ICD-10-CM | POA: Diagnosis not present

## 2022-01-21 NOTE — Assessment & Plan Note (Signed)
BP is elevated here today after stopping amlodipine due to ankle swelling. She states that BP is normal at home so we will continue to monitor. She is still taking valsartan 160 mg daily and if BP elevated at home she will notify us and we can increase valsartan to 320 mg daily.

## 2022-02-09 ENCOUNTER — Ambulatory Visit (INDEPENDENT_AMBULATORY_CARE_PROVIDER_SITE_OTHER)
Admission: RE | Admit: 2022-02-09 | Discharge: 2022-02-09 | Disposition: A | Discharge status: Home / Self Care | Payer: Medicare Other | Source: Ambulatory Visit | Attending: Gastroenterology | Admitting: Gastroenterology

## 2022-02-09 ENCOUNTER — Other Ambulatory Visit: Payer: Self-pay

## 2022-02-09 ENCOUNTER — Other Ambulatory Visit: Payer: Self-pay | Admitting: *Deleted

## 2022-02-09 ENCOUNTER — Telehealth: Payer: Medicare Other | Admitting: Physician Assistant

## 2022-02-09 DIAGNOSIS — R059 Cough, unspecified: Secondary | ICD-10-CM

## 2022-02-09 DIAGNOSIS — R051 Acute cough: Secondary | ICD-10-CM | POA: Diagnosis not present

## 2022-02-09 DIAGNOSIS — R0600 Dyspnea, unspecified: Secondary | ICD-10-CM | POA: Diagnosis not present

## 2022-02-09 MED ORDER — DOXYCYCLINE HYCLATE 100 MG PO CAPS: 100.0000 mg | ORAL_CAPSULE | Freq: Two times a day (BID) | ORAL | 0 refills | Status: AC

## 2022-02-09 MED ORDER — PREDNISONE 50 MG PO TABS: 50.0000 mg | ORAL_TABLET | Freq: Every day | ORAL | 0 refills | Status: DC

## 2022-02-09 MED ORDER — ALBUTEROL SULFATE HFA 108 (90 BASE) MCG/ACT IN AERS
2.0000 | INHALATION_SPRAY | Freq: Four times a day (QID) | RESPIRATORY_TRACT | 0 refills | Status: DC | PRN
Start: 2022-02-09 — End: 2022-09-21

## 2022-02-09 NOTE — Patient Instructions (Signed)
?Gina Jefferson, thank you for joining Rodney Booze, PA-C for today's virtual visit.  While this provider is not your primary care provider (PCP), if your PCP is located in our provider database this encounter information will be shared with them immediately following your visit. ? ?Consent: ?(Patient) Gina Jefferson provided verbal consent for this virtual visit at the beginning of the encounter. ? ?Current Medications: ? ?Current Outpatient Medications:  ?  albuterol (VENTOLIN HFA) 108 (90 Base) MCG/ACT inhaler, Inhale 2 puffs into the lungs every 6 (six) hours as needed for wheezing or shortness of breath., Disp: 8 g, Rfl: 0 ?  doxycycline (VIBRAMYCIN) 100 MG capsule, Take 1 capsule (100 mg total) by mouth 2 (two) times daily for 7 days., Disp: 14 capsule, Rfl: 0 ?  predniSONE (DELTASONE) 50 MG tablet, Take 1 tablet (50 mg total) by mouth daily for 5 days., Disp: 5 tablet, Rfl: 0 ?  ALPRAZolam (XANAX) 0.25 MG tablet, Take 1 tablet (0.25 mg total) by mouth daily as needed for anxiety., Disp: 10 tablet, Rfl: 0 ?  Ascorbic Acid (VITAMIN C) 500 MG CAPS, Take 500 mg by mouth daily., Disp: , Rfl:  ?  Biotin 1000 MCG tablet, Take 1,000 mcg by mouth daily., Disp: , Rfl:  ?  cetirizine (ZYRTEC) 10 MG tablet, Take 10 mg by mouth daily., Disp: , Rfl:  ?  Cholecalciferol (VITAMIN D PO), Take 5,000 Units by mouth daily., Disp: , Rfl:  ?  diclofenac (VOLTAREN) 50 MG EC tablet, Take 50 mg by mouth daily as needed., Disp: , Rfl:  ?  Diclofenac Sodium 3 % GEL, Reported on 05/05/2016, Disp: , Rfl: 98 ?  dicyclomine (BENTYL) 10 MG capsule, TAKE 1 CAPSULE(10 MG) BY MOUTH TWICE DAILY AS NEEDED FOR SPASMS**PLEASE SCHEDULE FOLLOW UP APPOINTMENT, Disp: 60 capsule, Rfl: 1 ?  Garlic 5465 MG CAPS, Take by mouth daily., Disp: , Rfl:  ?  lansoprazole (PREVACID) 30 MG capsule, TAKE 1 CAPSULE(30 MG) BY MOUTH AT BEDTIME, Disp: 90 capsule, Rfl: 2 ?  meclizine (ANTIVERT) 12.5 MG tablet, Take 1 tablet (12.5 mg total) by mouth 3 (three) times  daily as needed for dizziness., Disp: 60 tablet, Rfl: 0 ?  montelukast (SINGULAIR) 10 MG tablet, Take 1 tablet (10 mg total) by mouth at bedtime., Disp: 90 tablet, Rfl: 3 ?  ondansetron (ZOFRAN) 4 MG tablet, 1 tablet by mouth every 6 to 8 hours as needed for nausea, Disp: 30 tablet, Rfl: 1 ?  traZODone (DESYREL) 50 MG tablet, Take 0.5-1 tablets (25-50 mg total) by mouth at bedtime as needed for sleep., Disp: 30 tablet, Rfl: 3 ?  valsartan (DIOVAN) 160 MG tablet, TAKE 1 TABLET BY MOUTH  DAILY, Disp: 90 tablet, Rfl: 3  ? ?Medications ordered in this encounter:  ?Meds ordered this encounter  ?Medications  ? predniSONE (DELTASONE) 50 MG tablet  ?  Sig: Take 1 tablet (50 mg total) by mouth daily for 5 days.  ?  Dispense:  5 tablet  ?  Refill:  0  ?  Order Specific Question:   Supervising Provider  ?  Answer:   Noemi Chapel [[id number]]  ? albuterol (VENTOLIN HFA) 108 (90 Base) MCG/ACT inhaler  ?  Sig: Inhale 2 puffs into the lungs every 6 (six) hours as needed for wheezing or shortness of breath.  ?  Dispense:  8 g  ?  Refill:  0  ?  Order Specific Question:   Supervising Provider  ?  Answer:   Noemi Chapel [  [id number]]  ? doxycycline (VIBRAMYCIN) 100 MG capsule  ?  Sig: Take 1 capsule (100 mg total) by mouth 2 (two) times daily for 7 days.  ?  Dispense:  14 capsule  ?  Refill:  0  ?  Order Specific Question:   Supervising Provider  ?  Answer:   Noemi Chapel [[id number]]  ?  ? ?*If you need refills on other medications prior to your next appointment, please contact your pharmacy* ? ?Follow-Up: ?Call back or seek an in-person evaluation if the symptoms worsen or if the condition fails to improve as anticipated. ? ?Other Instructions ?Take the steroid, antibiotic and albuterol as directed  ? ?If your oxygen saturations drop below 93% you need to be seen in person. You will also need to be seen in person if your symptoms worsen or fail to improve within the next 24-48 hours ? ? ?If you have been instructed to have an in-person evaluation  today at a local Urgent Care facility, please use the link below. It will take you to a list of all of our available [facility], including address, phone number and hours of operation. Please do not delay care.  ?[facility] ? ?If you or a family member do not have a primary care provider, use the link below to schedule a visit and establish care. When you choose a [facility] primary care physician or advanced practice provider, you gain a long-term partner in health. ?Find a Primary Care Provider ? ?Learn more about 's in-office and virtual care options: ?[facility] ? ?

## 2022-02-09 NOTE — Progress Notes (Signed)
Ms. Gina Jefferson, Gina Jefferson are scheduled for a virtual visit with your provider today.   ? ?Just as we do with appointments in the office, we must obtain your consent to participate.  Your consent will be active for this visit and any virtual visit you may have with one of our providers in the next 365 days.   ? ?If you have a MyChart account, I can also send a copy of this consent to you electronically.  All virtual visits are billed to your insurance company just like a traditional visit in the office.  As this is a virtual visit, video technology does not allow for your provider to perform a traditional examination.  This may limit your provider's ability to fully assess your condition.  If your provider identifies any concerns that need to be evaluated in person or the need to arrange testing such as labs, EKG, etc, we will make arrangements to do so.   ? ?Although advances in technology are sophisticated, we cannot ensure that it will always work on either your end or our end.  If the connection with a video visit is poor, we may have to switch to a telephone visit.  With either a video or telephone visit, we are not always able to ensure that we have a secure connection.   I need to obtain your verbal consent now.   Are you willing to proceed with your visit today?  ? ?Gina Jefferson has provided verbal consent on 02/09/2022 for a virtual visit (video or telephone). ? ? ?Annie Roseboom Gilman Schmidt, PA-C ?02/09/2022  12:10 PM ? ? ?Date:  02/09/2022  ? ?ID:  Gina Jefferson, DOB [birth year], MRN [id number] ? ?Patient Location: Other:  work ?Provider Location: Home Office ? ? ?Participants: Patient and Provider for Visit and Wrap up ? ?Method of visit: Video  ?Location of Patient: Home ?Location of Provider: Home Office ?Consent was obtain for visit over the video. ?Services rendered by provider: Visit was performed via video ? ?A video enabled telemedicine application was used and I verified that I am speaking with the correct person  using two identifiers. ? ?PCP:  Hoyt Koch, MD  ? ?Chief Complaint:  cough ? ?History of Present Illness:   ? ?Gina Jefferson is a 71 y.o. female with history as stated below. ?Presents video telehealth for an acute care visit ? ?Pt reports a cough, nasal congestion, sinus pressure, headache, diarrhea, for the last week. She denies fevers.  ? ?She reports some chest discomfort with cough only. She has some mild shortness of breath only with walking. She has a pulse ox and states her o2 has been at 93%. She states she normally runs at about 97-100%. She reports wheezing as well. Denies asthma or copd. She does have a history of tobacco use. Denies leg swelling. She has tested for covid and it was negative.  ? ?Past Medical, Surgical, Social History, Allergies, and Medications have been Reviewed. ? ?Past Medical History:  ?Diagnosis Date  ? Anxiety   ? Arthritis   ? knee, c-spine  ? 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 s/p surgical release  ? Colon polyp   ? GERD   ? Hx of adenomatous polyp of colon 2010  ? Hyperlipidemia   ? HYPERTENSION   ? under control; has been on med. x 15 yrs.  ? Seasonal allergies   ? ? ?Current Meds  ?Medication Sig  ? albuterol (VENTOLIN HFA) 108 (90 Base) MCG/ACT  inhaler Inhale 2 puffs into the lungs every 6 (six) hours as needed for wheezing or shortness of breath.  ? doxycycline (VIBRAMYCIN) 100 MG capsule Take 1 capsule (100 mg total) by mouth 2 (two) times daily for 7 days.  ? predniSONE (DELTASONE) 50 MG tablet Take 1 tablet (50 mg total) by mouth daily for 5 days.  ?  ? ?Allergies:   Clindamycin/lincomycin, Ciprofloxacin, Penicillins, Statins, and Metformin  ? ?ROS ?See HPI for history of present illness. ? ?Physical Exam ?Constitutional:   ?   Appearance: Normal appearance.  ?Pulmonary:  ?   Effort: Pulmonary effort is normal.  ?   Comments: Dry cough, speaking in full sentences ?Neurological:  ?   Mental Status: She is alert.  ? ? ?      ?MDM: pt likely with  bronchitis though will cover with doxy for potential bacterial cause. I have also given rx for prednisone and albuterol. I strongly recommended that if she feel no improvement in 24 hours then she needs to be seen for an in person visit for a chest xray and physical exam. Sats are borderline at this time but she is comfortable appearing on exam. She is agreeable with this plan and appears to be reliable for follow up. ? ?There are no diagnoses linked to this encounter. ? ? ?Time:   ?Today, I have spent 12 minutes with the patient with telehealth technology discussing the above problems, reviewing the chart, previous notes, medications and orders.  ? ? ?Tests Ordered: ?No orders of the defined types were placed in this encounter. ? ? ?Medication Changes: ?Meds ordered this encounter  ?Medications  ? predniSONE (DELTASONE) 50 MG tablet  ?  Sig: Take 1 tablet (50 mg total) by mouth daily for 5 days.  ?  Dispense:  5 tablet  ?  Refill:  0  ?  Order Specific Question:   Supervising Provider  ?  Answer:   Noemi Chapel [[id number]]  ? albuterol (VENTOLIN HFA) 108 (90 Base) MCG/ACT inhaler  ?  Sig: Inhale 2 puffs into the lungs every 6 (six) hours as needed for wheezing or shortness of breath.  ?  Dispense:  8 g  ?  Refill:  0  ?  Order Specific Question:   Supervising Provider  ?  Answer:   Noemi Chapel [[id number]]  ? doxycycline (VIBRAMYCIN) 100 MG capsule  ?  Sig: Take 1 capsule (100 mg total) by mouth 2 (two) times daily for 7 days.  ?  Dispense:  14 capsule  ?  Refill:  0  ?  Order Specific Question:   Supervising Provider  ?  Answer:   Noemi Chapel [[id number]]  ? ? ? ?Disposition:  Follow up  ?Signed, ?Antanasia Kaczynski Gilman Schmidt, PA-C  ?02/09/2022 12:10 PM    ? ? ?

## 2022-02-14 ENCOUNTER — Encounter: Payer: Self-pay | Admitting: Internal Medicine

## 2022-02-14 ENCOUNTER — Ambulatory Visit (INDEPENDENT_AMBULATORY_CARE_PROVIDER_SITE_OTHER): Payer: Medicare Other | Admitting: Internal Medicine

## 2022-02-14 ENCOUNTER — Other Ambulatory Visit: Payer: Self-pay

## 2022-02-14 DIAGNOSIS — J069 Acute upper respiratory infection, unspecified: Secondary | ICD-10-CM | POA: Insufficient documentation

## 2022-02-14 NOTE — Assessment & Plan Note (Signed)
CXR was negative and virtual visit they opted to treat with doxycycline and prednisone and albuterol prn. She has improved and now has only residual cough and tiredness. Albuterol was not helpful. Finish antibiotic 1 more day and expect that cough may take another 1-2 weeks to resolve fully. Continue zyrtec daily otc.  ?

## 2022-02-14 NOTE — Progress Notes (Signed)
? ?  Subjective:  ? ?Patient ID: Gina Jefferson, female    DOB: [birth year], 71 y.o.   MRN: [id number] ? ?HPI ?The patient is a 71 YO female coming in for follow up virtual visit ? ?Review of Systems  ?Constitutional:  Positive for fatigue.  ?HENT: Negative.    ?Eyes: Negative.   ?Respiratory:  Positive for cough. Negative for chest tightness and shortness of breath.   ?Cardiovascular:  Negative for chest pain, palpitations and leg swelling.  ?Gastrointestinal:  Negative for abdominal distention, abdominal pain, constipation, diarrhea, nausea and vomiting.  ?Musculoskeletal: Negative.   ?Skin: Negative.   ?Neurological: Negative.   ?Psychiatric/Behavioral: Negative.    ? ?Objective:  ?Physical Exam ?Constitutional:   ?   Appearance: She is well-developed.  ?HENT:  ?   Head: Normocephalic and atraumatic.  ?Cardiovascular:  ?   Rate and Rhythm: Normal rate and regular rhythm.  ?Pulmonary:  ?   Effort: Pulmonary effort is normal. No respiratory distress.  ?   Breath sounds: Normal breath sounds. No wheezing or rales.  ?Abdominal:  ?   General: Bowel sounds are normal. There is no distension.  ?   Palpations: Abdomen is soft.  ?   Tenderness: There is no abdominal tenderness. There is no rebound.  ?Musculoskeletal:  ?   Cervical back: Normal range of motion.  ?Skin: ?   General: Skin is warm and dry.  ?Neurological:  ?   Mental Status: She is alert and oriented to person, place, and time.  ?   Coordination: Coordination normal.  ? ? ?Vitals:  ? 02/14/22 1327  ?BP: 130/84  ?Pulse: (!) 103  ?Resp: 18  ?Temp: 98.2 ?F (36.8 ?C)  ?TempSrc: Oral  ?SpO2: 96%  ?Weight: 151 lb 6.4 oz (68.7 kg)  ?Height: '5\' 1"'$  (1.549 m)  ? ? ?This visit occurred during the SARS-CoV-2 public health emergency.  Safety protocols were in place, including screening questions prior to the visit, additional usage of staff PPE, and extensive cleaning of exam room while observing appropriate contact time as indicated for disinfecting solutions.  ? ?Assessment  & Plan:  ?Visit time 15 minutes in face to face communication with patient and coordination of care, additional 5 minutes spent in record review, coordination or care, ordering tests, communicating/referring to other healthcare professionals, documenting in medical records all on the same day of the visit for total time 20 minutes spent on the visit.  ? ?

## 2022-02-17 ENCOUNTER — Encounter: Payer: Self-pay | Admitting: Internal Medicine

## 2022-02-18 DIAGNOSIS — M93272 Osteochondritis dissecans, left ankle and joints of left foot: Secondary | ICD-10-CM | POA: Diagnosis not present

## 2022-02-18 DIAGNOSIS — M25572 Pain in left ankle and joints of left foot: Secondary | ICD-10-CM | POA: Diagnosis not present

## 2022-02-20 ENCOUNTER — Other Ambulatory Visit: Payer: Self-pay | Admitting: Internal Medicine

## 2022-03-03 DIAGNOSIS — M25572 Pain in left ankle and joints of left foot: Secondary | ICD-10-CM | POA: Diagnosis not present

## 2022-03-09 DIAGNOSIS — M25572 Pain in left ankle and joints of left foot: Secondary | ICD-10-CM | POA: Diagnosis not present

## 2022-04-10 ENCOUNTER — Other Ambulatory Visit: Payer: Self-pay | Admitting: Physician Assistant

## 2022-04-29 DIAGNOSIS — M19072 Primary osteoarthritis, left ankle and foot: Secondary | ICD-10-CM | POA: Diagnosis not present

## 2022-06-01 ENCOUNTER — Encounter: Payer: Self-pay | Admitting: Internal Medicine

## 2022-06-01 DIAGNOSIS — I10 Essential (primary) hypertension: Secondary | ICD-10-CM

## 2022-06-02 DIAGNOSIS — H524 Presbyopia: Secondary | ICD-10-CM | POA: Diagnosis not present

## 2022-06-02 DIAGNOSIS — H04123 Dry eye syndrome of bilateral lacrimal glands: Secondary | ICD-10-CM | POA: Diagnosis not present

## 2022-06-02 DIAGNOSIS — H16223 Keratoconjunctivitis sicca, not specified as Sjogren's, bilateral: Secondary | ICD-10-CM | POA: Diagnosis not present

## 2022-06-02 DIAGNOSIS — R519 Headache, unspecified: Secondary | ICD-10-CM | POA: Diagnosis not present

## 2022-06-02 DIAGNOSIS — H40013 Open angle with borderline findings, low risk, bilateral: Secondary | ICD-10-CM | POA: Diagnosis not present

## 2022-06-07 ENCOUNTER — Telehealth: Payer: Self-pay

## 2022-06-07 NOTE — Telephone Encounter (Signed)
Laraina is currently on the dicyclomine '10mg'$  capsule, one BID prn spasms. She is asking if we can send in dicyclomine '20mg'$  capsules so she can just take one daily. Please advise Sir. Thank you.

## 2022-06-08 DIAGNOSIS — M25572 Pain in left ankle and joints of left foot: Secondary | ICD-10-CM | POA: Diagnosis not present

## 2022-06-08 MED ORDER — DICYCLOMINE HCL 20 MG PO TABS: 20.0000 mg | ORAL_TABLET | Freq: Two times a day (BID) | ORAL | 0 refills | Status: DC | PRN

## 2022-06-08 NOTE — Telephone Encounter (Signed)
Rx sent 

## 2022-06-10 ENCOUNTER — Other Ambulatory Visit: Payer: Self-pay

## 2022-06-10 DIAGNOSIS — M25572 Pain in left ankle and joints of left foot: Secondary | ICD-10-CM | POA: Diagnosis not present

## 2022-06-10 DIAGNOSIS — R262 Difficulty in walking, not elsewhere classified: Secondary | ICD-10-CM | POA: Diagnosis not present

## 2022-06-10 DIAGNOSIS — M25672 Stiffness of left ankle, not elsewhere classified: Secondary | ICD-10-CM | POA: Diagnosis not present

## 2022-06-10 MED ORDER — LANSOPRAZOLE 30 MG PO CPDR: DELAYED_RELEASE_CAPSULE | ORAL | 0 refills | Status: DC

## 2022-06-28 ENCOUNTER — Telehealth: Payer: Self-pay

## 2022-06-28 MED ORDER — VALSARTAN 320 MG PO TABS: 320.0000 mg | ORAL_TABLET | Freq: Every day | ORAL | 3 refills | Status: DC

## 2022-06-28 NOTE — Telephone Encounter (Signed)
Tamakia request 90 day supply of lansoprazole '30mg'$  be sent in to her mail order pharmacy. Please advise Sir, thank you. She last saw Amy Esterwood PA-C in 11/2019.

## 2022-06-29 MED ORDER — LANSOPRAZOLE 30 MG PO CPDR: DELAYED_RELEASE_CAPSULE | ORAL | 3 refills | Status: DC

## 2022-06-29 NOTE — Telephone Encounter (Signed)
Rx sent 

## 2022-07-14 ENCOUNTER — Other Ambulatory Visit: Payer: Self-pay | Admitting: Internal Medicine

## 2022-07-14 ENCOUNTER — Other Ambulatory Visit: Payer: Self-pay | Admitting: Physician Assistant

## 2022-07-14 DIAGNOSIS — G8918 Other acute postprocedural pain: Secondary | ICD-10-CM | POA: Diagnosis not present

## 2022-07-14 DIAGNOSIS — M25872 Other specified joint disorders, left ankle and foot: Secondary | ICD-10-CM | POA: Diagnosis not present

## 2022-07-14 DIAGNOSIS — S93492A Sprain of other ligament of left ankle, initial encounter: Secondary | ICD-10-CM | POA: Diagnosis not present

## 2022-07-14 DIAGNOSIS — M24172 Other articular cartilage disorders, left ankle: Secondary | ICD-10-CM | POA: Diagnosis not present

## 2022-07-14 DIAGNOSIS — M93272 Osteochondritis dissecans, left ankle and joints of left foot: Secondary | ICD-10-CM | POA: Diagnosis not present

## 2022-07-14 DIAGNOSIS — M67874 Other specified disorders of tendon, left ankle and foot: Secondary | ICD-10-CM | POA: Diagnosis not present

## 2022-07-14 DIAGNOSIS — M19072 Primary osteoarthritis, left ankle and foot: Secondary | ICD-10-CM | POA: Diagnosis not present

## 2022-07-14 DIAGNOSIS — M2681 Anterior soft tissue impingement: Secondary | ICD-10-CM | POA: Diagnosis not present

## 2022-07-14 DIAGNOSIS — M2142 Flat foot [pes planus] (acquired), left foot: Secondary | ICD-10-CM | POA: Diagnosis not present

## 2022-07-29 DIAGNOSIS — M93272 Osteochondritis dissecans, left ankle and joints of left foot: Secondary | ICD-10-CM | POA: Diagnosis not present

## 2022-07-29 DIAGNOSIS — M76822 Posterior tibial tendinitis, left leg: Secondary | ICD-10-CM | POA: Diagnosis not present

## 2022-07-29 DIAGNOSIS — M25572 Pain in left ankle and joints of left foot: Secondary | ICD-10-CM | POA: Diagnosis not present

## 2022-07-29 NOTE — Telephone Encounter (Signed)
LOV: 02/14/22  Last fill: 02/21/22 30 pills with 3 refills

## 2022-08-17 ENCOUNTER — Telehealth: Payer: Medicare Other

## 2022-08-18 ENCOUNTER — Telehealth: Payer: Medicare Other

## 2022-08-19 ENCOUNTER — Telehealth: Payer: Self-pay

## 2022-08-19 NOTE — Telephone Encounter (Signed)
LVM to discuss if she has had a recent mammo  If so, where? If not, would she like an order put in?

## 2022-08-26 DIAGNOSIS — M25572 Pain in left ankle and joints of left foot: Secondary | ICD-10-CM | POA: Diagnosis not present

## 2022-08-29 ENCOUNTER — Other Ambulatory Visit: Payer: Self-pay | Admitting: Internal Medicine

## 2022-08-30 DIAGNOSIS — M19072 Primary osteoarthritis, left ankle and foot: Secondary | ICD-10-CM | POA: Diagnosis not present

## 2022-08-30 DIAGNOSIS — M25672 Stiffness of left ankle, not elsewhere classified: Secondary | ICD-10-CM | POA: Diagnosis not present

## 2022-09-01 DIAGNOSIS — M25672 Stiffness of left ankle, not elsewhere classified: Secondary | ICD-10-CM | POA: Diagnosis not present

## 2022-09-01 DIAGNOSIS — M19072 Primary osteoarthritis, left ankle and foot: Secondary | ICD-10-CM | POA: Diagnosis not present

## 2022-09-06 DIAGNOSIS — M19072 Primary osteoarthritis, left ankle and foot: Secondary | ICD-10-CM | POA: Diagnosis not present

## 2022-09-06 DIAGNOSIS — M25672 Stiffness of left ankle, not elsewhere classified: Secondary | ICD-10-CM | POA: Diagnosis not present

## 2022-09-07 ENCOUNTER — Telehealth: Payer: Self-pay | Admitting: Internal Medicine

## 2022-09-07 NOTE — Telephone Encounter (Signed)
Left message for patient to call back to schedule Medicare Annual Wellness Visit   Last AWV  09/13/21  Please schedule at anytime with [facility] if patient calls the office back.     Any questions, please call me at [phone]

## 2022-09-08 DIAGNOSIS — M25672 Stiffness of left ankle, not elsewhere classified: Secondary | ICD-10-CM | POA: Diagnosis not present

## 2022-09-08 DIAGNOSIS — M19072 Primary osteoarthritis, left ankle and foot: Secondary | ICD-10-CM | POA: Diagnosis not present

## 2022-09-13 DIAGNOSIS — M19072 Primary osteoarthritis, left ankle and foot: Secondary | ICD-10-CM | POA: Diagnosis not present

## 2022-09-13 DIAGNOSIS — M25672 Stiffness of left ankle, not elsewhere classified: Secondary | ICD-10-CM | POA: Diagnosis not present

## 2022-09-16 DIAGNOSIS — M19072 Primary osteoarthritis, left ankle and foot: Secondary | ICD-10-CM | POA: Diagnosis not present

## 2022-09-16 DIAGNOSIS — M25672 Stiffness of left ankle, not elsewhere classified: Secondary | ICD-10-CM | POA: Diagnosis not present

## 2022-09-20 DIAGNOSIS — M25672 Stiffness of left ankle, not elsewhere classified: Secondary | ICD-10-CM | POA: Diagnosis not present

## 2022-09-20 DIAGNOSIS — M19072 Primary osteoarthritis, left ankle and foot: Secondary | ICD-10-CM | POA: Diagnosis not present

## 2022-09-21 ENCOUNTER — Ambulatory Visit (INDEPENDENT_AMBULATORY_CARE_PROVIDER_SITE_OTHER): Payer: Medicare Other

## 2022-09-21 DIAGNOSIS — Z0000 Encounter for general adult medical examination without abnormal findings: Secondary | ICD-10-CM

## 2022-09-21 NOTE — Progress Notes (Signed)
Virtual Visit via Telephone Note  I connected with  Gina Jefferson on 09/21/22 at 10:30 AM EDT by telephone and verified that I am speaking with the correct person using two identifiers.  Location: Patient: Home Provider: [place] Persons participating in the virtual visit: [place]   I discussed the limitations, risks, security and privacy concerns of performing an evaluation and management service by telephone and the availability of in person appointments. The patient expressed understanding and agreed to proceed.  Interactive audio and video telecommunications were attempted between this nurse and patient, however failed, due to patient having technical difficulties OR patient did not have access to video capability.  We continued and completed visit with audio only.  Some vital signs may be absent or patient reported.   Sheral Flow, LPN  Subjective:   Gina Jefferson is a 71 y.o. female who presents for Medicare Annual (Subsequent) preventive examination.  Review of Systems     Cardiac Risk Factors include: advanced age (>41mn, >>45women);dyslipidemia;family history of premature cardiovascular disease;hypertension     Objective:    There were no vitals filed for this visit. There is no height or weight on file to calculate BMI.     09/21/2022   10:35 AM 02/23/2018    9:56 AM 11/17/2011    3:58 PM  Advanced Directives  Does Patient Have a Medical Advance Directive? Yes Yes Patient has advance directive, copy not in chart  Type of Advance Directive [place] Living will;Healthcare Power of [place] Chart? No - copy requested No - copy requested     Current Medications (verified) Outpatient Encounter Medications as of 09/21/2022  Medication Sig   ALPRAZolam (XANAX) 0.25 MG tablet Take 1 tablet (0.25 mg total) by mouth  daily as needed for anxiety.   Ascorbic Acid (VITAMIN C) 500 MG CAPS Take 500 mg by mouth daily.   Biotin 1000 MCG tablet Take 1,000 mcg by mouth daily.   cetirizine (ZYRTEC) 10 MG tablet Take 10 mg by mouth daily.   Cholecalciferol (VITAMIN D PO) Take 5,000 Units by mouth daily.   diclofenac (VOLTAREN) 50 MG EC tablet Take 50 mg by mouth daily as needed.   Diclofenac Sodium 3 % GEL Reported on 05/05/2016   dicyclomine (BENTYL) 20 MG tablet TAKE 1 TABLET(20 MG) BY MOUTH TWICE DAILY AS NEEDED FOR SPASMS OR ABDOMINAL PAIN   Garlic 11856MG CAPS Take by mouth daily.   lansoprazole (PREVACID) 30 MG capsule TAKE 1 CAPSULE(30 MG) BY MOUTH AT BEDTIME   meclizine (ANTIVERT) 12.5 MG tablet Take 1 tablet (12.5 mg total) by mouth 3 (three) times daily as needed for dizziness.   ondansetron (ZOFRAN) 4 MG tablet 1 tablet by mouth every 6 to 8 hours as needed for nausea   traZODone (DESYREL) 50 MG tablet TAKE 1/2 TO 1 TABLET(25 TO 50 MG) BY MOUTH AT BEDTIME AS NEEDED FOR SLEEP   valsartan (DIOVAN) 320 MG tablet Take 1 tablet (320 mg total) by mouth daily.   [DISCONTINUED] albuterol (VENTOLIN HFA) 108 (90 Base) MCG/ACT inhaler Inhale 2 puffs into the lungs every 6 (six) hours as needed for wheezing or shortness of breath.   [DISCONTINUED] montelukast (SINGULAIR) 10 MG tablet Take 1 tablet (10 mg total) by mouth at bedtime.   No facility-administered encounter medications on file as of 09/21/2022.    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 x 15 yrs.   Seasonal allergies    Past Surgical History:  Procedure Laterality Date   ABDOMINAL HYSTERECTOMY      complete   APPENDECTOMY     BREAST REDUCTION SURGERY  2016   CARPAL TUNNEL RELEASE  11/24/2011   Procedure: CARPAL TUNNEL RELEASE;  Surgeon: Garald Balding, MD;  Location: [facility];  Service: Orthopedics;  Laterality: Right;  CARPAL TUNNEL REL ON RIGHT    CATARACT EXTRACTION, BILATERAL Bilateral 2018   CESAREAN SECTION     CHOLECYSTECTOMY     COLONOSCOPY     OOPHORECTOMY     OVARIAN CYST REMOVAL     POLYPECTOMY     TONSILLECTOMY     WISDOM TOOTH EXTRACTION     Family History  Problem Relation Age of Onset   Diabetes Mother    Stroke Mother    Heart disease Mother    Hyperlipidemia Mother    Hypertension Mother    Stroke Father    Alzheimer's disease Father    Mental illness Father    Hypertension Father    Hyperlipidemia Father    Cancer Maternal Grandfather        stomach cancer   Colon cancer Neg Hx    Social History   Socioeconomic History   Marital status: Married    Spouse name: Not on file   Number of children: Not on file   Years of education: Not on file   Highest education level: Not on file  Occupational History   Not on file  Tobacco Use   Smoking status: Former    Types: Cigarettes    Quit date: 11/29/2003    Years since quitting: 18.8   Smokeless tobacco: Never  Vaping Use   Vaping Use: Never used  Substance and Sexual Activity   Alcohol use: No    Alcohol/week: 0.0 standard drinks of alcohol    Comment: occasionally   Drug use: No   Sexual activity: Not on file  Other Topics Concern   Not on file  Social History Narrative   Works at [employer] as [employer].   Lives with husband.  She has two adult children.    Social Determinants of Health   Financial Resource Strain: Low Risk  (09/21/2022)   Overall Financial Resource Strain (CARDIA)    Difficulty of Paying Living Expenses: Not hard at all  Food Insecurity: No Food Insecurity (09/21/2022)   Hunger Vital Sign    Worried About Running Out of Food in the Last Year: Never true     Ran Out of Food in the Last Year: Never true  Transportation Needs: No Transportation Needs (09/21/2022)   PRAPARE - Hydrologist (Medical): No    Lack of Transportation (Non-Medical): No  Physical Activity: Insufficiently Active (09/21/2022)   Exercise Vital Sign    Days of Exercise per Week: 2 days    Minutes of Exercise per Session: 50 min  Stress: No Stress Concern Present (09/21/2022)   [place]  Feeling of Stress : Not at all  Social Connections: Socially Integrated (09/21/2022)   Social Connection and Isolation Panel [NHANES]    Frequency of Communication with Friends and Family: More than three times a week    Frequency of Social Gatherings with Friends and Family: More than three times a week    Attends Religious Services: More than 4 times per year    Active Member of [employer] or Organizations: Yes    Attends Music therapist: More than 4 times per year    Marital Status: Married    Tobacco Counseling Counseling given: Not Answered   Clinical Intake:  Pre-visit preparation completed: Yes  Pain : No/denies pain     BMI - recorded: 28.62 (02/24/2022) Nutritional Status: BMI 25 -29 Overweight Nutritional Risks: None Diabetes: No  How often do you need to have someone help you when you read instructions, pamphlets, or other written materials from your doctor or pharmacy?: 1 - Never What is the last grade level you completed in school?: HSG; Associate's degree  Diabetic? no  Interpreter Needed?: No  Information entered by :: Lisette Abu, LPN.   Activities of Daily Living    09/21/2022   10:36 AM  In your present state of health, do you have any difficulty performing the following activities:  Hearing? 0  Vision? 0  Difficulty concentrating or making decisions? 0  Walking or climbing stairs? 0  Dressing or bathing? 0  Doing errands,  shopping? 0  Preparing Food and eating ? N  Using the Toilet? N  In the past six months, have you accidently leaked urine? N  Do you have problems with loss of bowel control? N  Managing your Medications? N  Managing your Finances? N  Housekeeping or managing your Housekeeping? N    Patient Care Team: Hoyt Koch, MD as PCP - General (Internal Medicine) Gatha Mayer, MD as Consulting Physician (Gastroenterology) Dian Queen, MD as Consulting Physician (Obstetrics and Gynecology) Almedia Balls, MD as Consulting Physician (Orthopedic Surgery) Jacelyn Pi, MD (Endocrinology) [name], [facility] as Pharmacist (Pharmacist)  Indicate any recent Medical Services you may have received from other than [facility] providers in the past year (date may be approximate).     Assessment:   This is a routine wellness examination for Gina Jefferson.  Hearing/Vision screen Hearing Screening - Comments:: Denies hearing difficulties   Vision Screening - Comments:: Wears rx glasses - up to date with routine eye exams with [facility]   Dietary issues and exercise activities discussed: Current Exercise Habits: Structured exercise class, Type of exercise: Other - see comments (physical therapy), Time (Minutes): 60, Frequency (Times/Week): 2, Weekly Exercise (Minutes/Week): 120, Intensity: Moderate, Exercise limited by: orthopedic condition(s)   Goals Addressed             This Visit's Progress    Track and Manage My Blood Pressure-Hypertension       Timeframe:  Long-Range Goal Priority:  High Start Date:      09/21/2022                       Expected End Date:    10//25/2024       - check blood pressure 3 times per week - choose a place to take my blood pressure (home, clinic or office, retail store) - write blood pressure results in a log or diary  -monitor pulse and monitor for any symptoms related to bradycardia - lightheadedness, dizziness, fatigue, syncope  Why is  this important?   You won't feel high blood pressure, but it can still hurt your blood vessels.  High blood pressure can cause heart or kidney problems. It can also cause a stroke.  Making lifestyle changes like losing a little weight or eating less salt will help.  Checking your blood pressure at home and at different times of the day can help to control blood pressure.  If the doctor prescribes medicine remember to take it the way the doctor ordered.  Call the office if you cannot afford the medicine or if there are questions about it.         Depression Screen    09/21/2022   10:36 AM 09/13/2021    2:10 PM 09/13/2021    2:01 PM 06/29/2020   10:23 AM 06/28/2019   10:19 AM 02/23/2018    9:56 AM 08/02/2017    5:21 PM  PHQ 2/9 Scores  PHQ - 2 Score 0 0  0 0 0 0  PHQ- 9 Score      3   Exception Documentation   Other- indicate reason in comment box      Not completed   pt stated that she has been going some personal issues.        Fall Risk    09/21/2022   10:36 AM 09/13/2021    2:01 PM 06/29/2020   10:23 AM 06/28/2019   10:19 AM 06/22/2018   12:54 PM  Fall Risk   Falls in the past year? 0 0 0 0 No  Number falls in past yr: 0 0     Injury with Fall? 0 0     Risk for fall due to : No Fall Risks      Follow up Falls prevention discussed        [place]:  Any stairs in or around the home? Yes  If so, are there any without handrails? No  Home free of loose throw rugs in walkways, pet beds, electrical cords, etc? Yes  Adequate lighting in your home to reduce risk of falls? Yes   ASSISTIVE DEVICES UTILIZED TO PREVENT FALLS:  Life alert? No  Use of a cane, walker or w/c? Yes  Grab bars in the bathroom? Yes  Shower chair or bench in shower? Yes  Elevated toilet seat or a handicapped toilet? No   TIMED UP AND GO:  Was the test performed? No . Phone Visit  Cognitive Function:        09/21/2022   10:39 AM  6CIT Screen  What Year? 0  points  What month? 0 points  What time? 0 points  Count back from 20 0 points  Months in reverse 0 points  Repeat phrase 0 points  Total Score 0 points    Immunizations Immunization History  Administered Date(s) Administered   Fluad Quad(high Dose 65+) 08/21/2019, 08/27/2020, 08/31/2021, 09/11/2022   Influenza Split 08/28/2012   Influenza Whole 08/08/2005, 08/19/2008, 09/28/2009, 09/28/2010   Influenza,inj,Quad PF,6+ Mos 08/28/2013, 09/11/2014, 09/06/2016   Influenza-Unspecified 08/12/2013, 09/12/2015, 09/07/2017, 09/11/2018, 04/09/2020   PFIZER(Purple Top)SARS-COV-2 Vaccination 12/07/2019, 12/26/2019, 09/18/2020   Pfizer Covid-19 Vaccine Bivalent Booster 62yr & up 08/19/2021   Pneumococcal Conjugate-13 02/21/2017   Pneumococcal Polysaccharide-23 01/16/2006, 02/23/2018   Td 01/17/2005   Tdap 02/27/2015   Zoster, Live 01/15/2013    TDAP status: Up to date  Flu Vaccine status: Up to date  Pneumococcal vaccine status: Up to date  Covid-19 vaccine status:  Completed vaccines  Qualifies for Shingles Vaccine? Yes   Zostavax completed Yes   Shingrix Completed?: No.    Education has been provided regarding the importance of this vaccine. Patient has been advised to call insurance company to determine out of pocket expense if they have not yet received this vaccine. Advised may also receive vaccine at local pharmacy or Health Dept. Verbalized acceptance and understanding.  Screening Tests Health Maintenance  Topic Date Due   Zoster Vaccines- Shingrix (1 of 2) Never done   COVID-19 Vaccine (5 - Pfizer series) 12/19/2021   Medicare Annual Wellness (AWV)  10/22/2023   MAMMOGRAM  12/24/2023   TETANUS/TDAP  02/26/2025   COLONOSCOPY (Pts 45-33yr Insurance coverage will need to be confirmed)  07/19/2025   Pneumonia Vaccine 70 Years old  Completed   INFLUENZA VACCINE  Completed   DEXA SCAN  Completed   Hepatitis C Screening  Completed   HPV VACCINES  Aged Out    Health  Maintenance  Health Maintenance Due  Topic Date Due   Zoster Vaccines- Shingrix (1 of 2) Never done   COVID-19 Vaccine (5 - Pfizer series) 12/19/2021    Colorectal cancer screening: Type of screening: Colonoscopy. Completed 07/20/2015. Repeat every 10 years  Mammogram status: Completed 12/23/2021. Repeat every year (completed at OB/GYN)  Bone Density status: Completed 11/26/2020. Results reflect: Bone density results: NORMAL. Repeat every 2 years. (Completed at OB/GYN)  Lung Cancer Screening: (Low Dose CT Chest recommended if Age 70-80years, 30 pack-year currently smoking OR have quit w/in 15years.) does not qualify.   Lung Cancer Screening Referral: NO  Additional Screening:  Hepatitis C Screening: does qualify; Completed 02/18/2016  Vision Screening: Recommended annual ophthalmology exams for early detection of glaucoma and other disorders of the eye. Is the patient up to date with their annual eye exam?  Yes  Who is the provider or what is the name of the office in which the patient attends annual eye exams? [facility] is not established with a provider, would they like to be referred to a provider to establish care? No .   Dental Screening: Recommended annual dental exams for proper oral hygiene  Community Resource Referral / Chronic Care Management: CRR required this visit?  No   CCM required this visit?  No      Plan:     I have personally reviewed and noted the following in the patient's chart:   Medical and social history Use of alcohol, tobacco or illicit drugs  Current medications and supplements including opioid prescriptions. Patient is not currently taking opioid prescriptions. Functional ability and status Nutritional status Physical activity Advanced directives List of other physicians Hospitalizations, surgeries, and ER visits in previous 12 months Vitals Screenings to include cognitive, depression, and falls Referrals and  appointments  In addition, I have reviewed and discussed with patient certain preventive protocols, quality metrics, and best practice recommendations. A written personalized care plan for preventive services as well as general preventive health recommendations were provided to patient.     SSheral Flow LPN   [id number]  Nurse Notes: N/A

## 2022-09-21 NOTE — Patient Instructions (Signed)
Gina Jefferson , Thank you for taking time to come for your Medicare Wellness Visit. I appreciate your ongoing commitment to your health goals. Please review the following plan we discussed and let me know if I can assist you in the future.   These are the goals we discussed:  Goals      Track and Manage My Blood Pressure-Hypertension     Timeframe:  Long-Range Goal Priority:  High Start Date:      09/21/2022                       Expected End Date:    10//25/2024       - check blood pressure 3 times per week - choose a place to take my blood pressure (home, clinic or office, retail store) - write blood pressure results in a log or diary  -monitor pulse and monitor for any symptoms related to bradycardia - lightheadedness, dizziness, fatigue, syncope   Why is this important?   You won't feel high blood pressure, but it can still hurt your blood vessels.  High blood pressure can cause heart or kidney problems. It can also cause a stroke.  Making lifestyle changes like losing a little weight or eating less salt will help.  Checking your blood pressure at home and at different times of the day can help to control blood pressure.  If the doctor prescribes medicine remember to take it the way the doctor ordered.  Call the office if you cannot afford the medicine or if there are questions about it.           This is a list of the screening recommended for you and due dates:  Health Maintenance  Topic Date Due   Zoster (Shingles) Vaccine (1 of 2) Never done   COVID-19 Vaccine (5 - Pfizer series) 12/19/2021   Medicare Annual Wellness Visit  10/22/2023   Mammogram  12/24/2023   Tetanus Vaccine  02/26/2025   Colon Cancer Screening  07/19/2025   Pneumonia Vaccine  Completed   Flu Shot  Completed   DEXA scan (bone density measurement)  Completed   Hepatitis C Screening: USPSTF Recommendation to screen - Ages 18-79 yo.  Completed   HPV Vaccine  Aged Out    Advanced directives: YES;  Please bring a copy of your health care power of attorney and living will to the office at your convenience.  Conditions/risks identified: YES  Next appointment: Follow up in one year for your annual wellness visit.   Preventive Care 5 Years and Older, Female Preventive care refers to lifestyle choices and visits with your health care provider that can promote health and wellness. What does preventive care include? A yearly physical exam. This is also called an annual well check. Dental exams once or twice a year. Routine eye exams. Ask your health care provider how often you should have your eyes checked. Personal lifestyle choices, including: Daily care of your teeth and gums. Regular physical activity. Eating a healthy diet. Avoiding tobacco and drug use. Limiting alcohol use. Practicing safe sex. Taking low-dose aspirin every day. Taking vitamin and mineral supplements as recommended by your health care provider. What happens during an annual well check? The services and screenings done by your health care provider during your annual well check will depend on your age, overall health, lifestyle risk factors, and family history of disease. Counseling  Your health care provider may ask you questions about your: Alcohol use. Tobacco  use. Drug use. Emotional well-being. Home and relationship well-being. Sexual activity. Eating habits. History of falls. Memory and ability to understand (cognition). Work and work Statistician. Reproductive health. Screening  You may have the following tests or measurements: Height, weight, and BMI. Blood pressure. Lipid and cholesterol levels. These may be checked every 5 years, or more frequently if you are over 77 years old. Skin check. Lung cancer screening. You may have this screening every year starting at age 106 if you have a 30-pack-year history of smoking and currently smoke or have quit within the past 15 years. Fecal occult blood  test (FOBT) of the stool. You may have this test every year starting at age 59. Flexible sigmoidoscopy or colonoscopy. You may have a sigmoidoscopy every 5 years or a colonoscopy every 10 years starting at age 45. Hepatitis C blood test. Hepatitis B blood test. Sexually transmitted disease (STD) testing. Diabetes screening. This is done by checking your blood sugar (glucose) after you have not eaten for a while (fasting). You may have this done every 1-3 years. Bone density scan. This is done to screen for osteoporosis. You may have this done starting at age 64. Mammogram. This may be done every 1-2 years. Talk to your health care provider about how often you should have regular mammograms. Talk with your health care provider about your test results, treatment options, and if necessary, the need for more tests. Vaccines  Your health care provider may recommend certain vaccines, such as: Influenza vaccine. This is recommended every year. Tetanus, diphtheria, and acellular pertussis (Tdap, Td) vaccine. You may need a Td booster every 10 years. Zoster vaccine. You may need this after age 5. Pneumococcal 13-valent conjugate (PCV13) vaccine. One dose is recommended after age 1. Pneumococcal polysaccharide (PPSV23) vaccine. One dose is recommended after age 61. Talk to your health care provider about which screenings and vaccines you need and how often you need them. This information is not intended to replace advice given to you by your health care provider. Make sure you discuss any questions you have with your health care provider. Document Released: 12/11/2015 Document Revised: 08/03/2016 Document Reviewed: 09/15/2015 Elsevier Interactive Patient Education  2017 [place] Prevention in the Home Falls can cause injuries. They can happen to people of all ages. There are many things you can do to make your home safe and to help prevent falls. What can I do on the outside of my  home? Regularly fix the edges of walkways and driveways and fix any cracks. Remove anything that might make you trip as you walk through a door, such as a raised step or threshold. Trim any bushes or trees on the path to your home. Use bright outdoor lighting. Clear any walking paths of anything that might make someone trip, such as rocks or tools. Regularly check to see if handrails are loose or broken. Make sure that both sides of any steps have handrails. Any raised decks and porches should have guardrails on the edges. Have any leaves, snow, or ice cleared regularly. Use sand or salt on walking paths during winter. Clean up any spills in your garage right away. This includes oil or grease spills. What can I do in the bathroom? Use night lights. Install grab bars by the toilet and in the tub and shower. Do not use towel bars as grab bars. Use non-skid mats or decals in the tub or shower. If you need to sit down in the shower, use a  plastic, non-slip stool. Keep the floor dry. Clean up any water that spills on the floor as soon as it happens. Remove soap buildup in the tub or shower regularly. Attach bath mats securely with double-sided non-slip rug tape. Do not have throw rugs and other things on the floor that can make you trip. What can I do in the bedroom? Use night lights. Make sure that you have a light by your bed that is easy to reach. Do not use any sheets or blankets that are too big for your bed. They should not hang down onto the floor. Have a firm chair that has side arms. You can use this for support while you get dressed. Do not have throw rugs and other things on the floor that can make you trip. What can I do in the kitchen? Clean up any spills right away. Avoid walking on wet floors. Keep items that you use a lot in easy-to-reach places. If you need to reach something above you, use a strong step stool that has a grab bar. Keep electrical cords out of the way. Do  not use floor polish or wax that makes floors slippery. If you must use wax, use non-skid floor wax. Do not have throw rugs and other things on the floor that can make you trip. What can I do with my stairs? Do not leave any items on the stairs. Make sure that there are handrails on both sides of the stairs and use them. Fix handrails that are broken or loose. Make sure that handrails are as long as the stairways. Check any carpeting to make sure that it is firmly attached to the stairs. Fix any carpet that is loose or worn. Avoid having throw rugs at the top or bottom of the stairs. If you do have throw rugs, attach them to the floor with carpet tape. Make sure that you have a light switch at the top of the stairs and the bottom of the stairs. If you do not have them, ask someone to add them for you. What else can I do to help prevent falls? Wear shoes that: Do not have high heels. Have rubber bottoms. Are comfortable and fit you well. Are closed at the toe. Do not wear sandals. If you use a stepladder: Make sure that it is fully opened. Do not climb a closed stepladder. Make sure that both sides of the stepladder are locked into place. Ask someone to hold it for you, if possible. Clearly mark and make sure that you can see: Any grab bars or handrails. First and last steps. Where the edge of each step is. Use tools that help you move around (mobility aids) if they are needed. These include: Canes. Walkers. Scooters. Crutches. Turn on the lights when you go into a dark area. Replace any light bulbs as soon as they burn out. Set up your furniture so you have a clear path. Avoid moving your furniture around. If any of your floors are uneven, fix them. If there are any pets around you, be aware of where they are. Review your medicines with your doctor. Some medicines can make you feel dizzy. This can increase your chance of falling. Ask your doctor what other things that you can do to  help prevent falls. This information is not intended to replace advice given to you by your health care provider. Make sure you discuss any questions you have with your health care provider. Document Released: 09/10/2009 Document Revised: 04/21/2016  Document Reviewed: 12/19/2014 Elsevier Interactive Patient Education  2017 [employer].

## 2022-09-22 DIAGNOSIS — M19072 Primary osteoarthritis, left ankle and foot: Secondary | ICD-10-CM | POA: Diagnosis not present

## 2022-09-22 DIAGNOSIS — M25672 Stiffness of left ankle, not elsewhere classified: Secondary | ICD-10-CM | POA: Diagnosis not present

## 2022-09-27 DIAGNOSIS — M19072 Primary osteoarthritis, left ankle and foot: Secondary | ICD-10-CM | POA: Diagnosis not present

## 2022-09-27 DIAGNOSIS — M25672 Stiffness of left ankle, not elsewhere classified: Secondary | ICD-10-CM | POA: Diagnosis not present

## 2022-09-29 DIAGNOSIS — M19072 Primary osteoarthritis, left ankle and foot: Secondary | ICD-10-CM | POA: Diagnosis not present

## 2022-09-29 DIAGNOSIS — M25672 Stiffness of left ankle, not elsewhere classified: Secondary | ICD-10-CM | POA: Diagnosis not present

## 2022-10-04 DIAGNOSIS — M25672 Stiffness of left ankle, not elsewhere classified: Secondary | ICD-10-CM | POA: Diagnosis not present

## 2022-10-04 DIAGNOSIS — M19072 Primary osteoarthritis, left ankle and foot: Secondary | ICD-10-CM | POA: Diagnosis not present

## 2022-10-06 DIAGNOSIS — M19072 Primary osteoarthritis, left ankle and foot: Secondary | ICD-10-CM | POA: Diagnosis not present

## 2022-10-06 DIAGNOSIS — M25672 Stiffness of left ankle, not elsewhere classified: Secondary | ICD-10-CM | POA: Diagnosis not present

## 2022-10-07 DIAGNOSIS — M19072 Primary osteoarthritis, left ankle and foot: Secondary | ICD-10-CM | POA: Diagnosis not present

## 2022-10-11 DIAGNOSIS — M25672 Stiffness of left ankle, not elsewhere classified: Secondary | ICD-10-CM | POA: Diagnosis not present

## 2022-10-11 DIAGNOSIS — M19072 Primary osteoarthritis, left ankle and foot: Secondary | ICD-10-CM | POA: Diagnosis not present

## 2022-10-13 DIAGNOSIS — M25672 Stiffness of left ankle, not elsewhere classified: Secondary | ICD-10-CM | POA: Diagnosis not present

## 2022-10-13 DIAGNOSIS — M19072 Primary osteoarthritis, left ankle and foot: Secondary | ICD-10-CM | POA: Diagnosis not present

## 2022-10-31 ENCOUNTER — Telehealth: Payer: Medicare Other | Admitting: Physician Assistant

## 2022-10-31 DIAGNOSIS — J205 Acute bronchitis due to respiratory syncytial virus: Secondary | ICD-10-CM | POA: Diagnosis not present

## 2022-10-31 MED ORDER — BENZONATATE 100 MG PO CAPS: 100.0000 mg | ORAL_CAPSULE | Freq: Three times a day (TID) | ORAL | 0 refills | Status: DC | PRN

## 2022-10-31 MED ORDER — PREDNISONE 20 MG PO TABS: 40.0000 mg | ORAL_TABLET | Freq: Every day | ORAL | 0 refills | Status: DC

## 2022-10-31 MED ORDER — ALBUTEROL SULFATE HFA 108 (90 BASE) MCG/ACT IN AERS: 2.0000 | INHALATION_SPRAY | Freq: Four times a day (QID) | RESPIRATORY_TRACT | 0 refills | Status: AC | PRN

## 2022-10-31 MED ORDER — PROMETHAZINE-DM 6.25-15 MG/5ML PO SYRP
5.0000 mL | ORAL_SOLUTION | Freq: Four times a day (QID) | ORAL | 0 refills | Status: DC | PRN
Start: 2022-10-31 — End: 2023-01-18

## 2022-10-31 NOTE — Patient Instructions (Signed)
Aubreyanna S Mcphee, thank you for joining Mar Daring, PA-C for today's virtual visit.  While this provider is not your primary care provider (PCP), if your PCP is located in our provider database this encounter information will be shared with them immediately following your visit.   [place] [place] account gives you access to today's visit and all your visits, tests, and labs performed at [facility] " click here if you don't have a [facility] account or go to [email or web address]  Consent: (Patient) Jannel S Forti provided verbal consent for this virtual visit at the beginning of the encounter.  Current Medications:  Current Outpatient Medications:    albuterol (VENTOLIN HFA) 108 (90 Base) MCG/ACT inhaler, Inhale 2 puffs into the lungs every 6 (six) hours as needed for wheezing or shortness of breath., Disp: 8 g, Rfl: 0   benzonatate (TESSALON) 100 MG capsule, Take 1 capsule (100 mg total) by mouth 3 (three) times daily as needed., Disp: 30 capsule, Rfl: 0   predniSONE (DELTASONE) 20 MG tablet, Take 2 tablets (40 mg total) by mouth daily with breakfast., Disp: 10 tablet, Rfl: 0   promethazine-dextromethorphan (PROMETHAZINE-DM) 6.25-15 MG/5ML syrup, Take 5 mLs by mouth 4 (four) times daily as needed., Disp: 118 mL, Rfl: 0   ALPRAZolam (XANAX) 0.25 MG tablet, Take 1 tablet (0.25 mg total) by mouth daily as needed for anxiety., Disp: 10 tablet, Rfl: 0   Ascorbic Acid (VITAMIN C) 500 MG CAPS, Take 500 mg by mouth daily., Disp: , Rfl:    Biotin 1000 MCG tablet, Take 1,000 mcg by mouth daily., Disp: , Rfl:    cetirizine (ZYRTEC) 10 MG tablet, Take 10 mg by mouth daily., Disp: , Rfl:    Cholecalciferol (VITAMIN D PO), Take 5,000 Units by mouth daily., Disp: , Rfl:    diclofenac (VOLTAREN) 50 MG EC tablet, Take 50 mg by mouth daily as needed., Disp: , Rfl:    Diclofenac Sodium 3 % GEL, Reported on 05/05/2016, Disp: , Rfl: 98   dicyclomine (BENTYL) 20 MG tablet,  TAKE 1 TABLET(20 MG) BY MOUTH TWICE DAILY AS NEEDED FOR SPASMS OR ABDOMINAL PAIN, Disp: 180 tablet, Rfl: 0   Garlic 6384 MG CAPS, Take by mouth daily., Disp: , Rfl:    lansoprazole (PREVACID) 30 MG capsule, TAKE 1 CAPSULE(30 MG) BY MOUTH AT BEDTIME, Disp: 90 capsule, Rfl: 3   meclizine (ANTIVERT) 12.5 MG tablet, Take 1 tablet (12.5 mg total) by mouth 3 (three) times daily as needed for dizziness., Disp: 60 tablet, Rfl: 0   ondansetron (ZOFRAN) 4 MG tablet, 1 tablet by mouth every 6 to 8 hours as needed for nausea, Disp: 30 tablet, Rfl: 1   traZODone (DESYREL) 50 MG tablet, TAKE 1/2 TO 1 TABLET(25 TO 50 MG) BY MOUTH AT BEDTIME AS NEEDED FOR SLEEP, Disp: 30 tablet, Rfl: 3   valsartan (DIOVAN) 320 MG tablet, Take 1 tablet (320 mg total) by mouth daily., Disp: 90 tablet, Rfl: 3   Medications ordered in this encounter:  Meds ordered this encounter  Medications   albuterol (VENTOLIN HFA) 108 (90 Base) MCG/ACT inhaler    Sig: Inhale 2 puffs into the lungs every 6 (six) hours as needed for wheezing or shortness of breath.    Dispense:  8 g    Refill:  0    Order Specific Question:   Supervising Provider    Answer:   Chase Picket [[id number]]   predniSONE (DELTASONE) 20 MG tablet    Sig:  Take 2 tablets (40 mg total) by mouth daily with breakfast.    Dispense:  10 tablet    Refill:  0    Order Specific Question:   Supervising Provider    Answer:   Chase Picket [[id number]]   promethazine-dextromethorphan (PROMETHAZINE-DM) 6.25-15 MG/5ML syrup    Sig: Take 5 mLs by mouth 4 (four) times daily as needed.    Dispense:  118 mL    Refill:  0    Order Specific Question:   Supervising Provider    Answer:   Chase Picket [[id number]]   benzonatate (TESSALON) 100 MG capsule    Sig: Take 1 capsule (100 mg total) by mouth 3 (three) times daily as needed.    Dispense:  30 capsule    Refill:  0    Order Specific Question:   Supervising Provider    Answer:   Chase Picket [id number]     *If you  need refills on other medications prior to your next appointment, please contact your pharmacy*  Follow-Up: Call back or seek an in-person evaluation if the symptoms worsen or if the condition fails to improve as anticipated.  [place] [phone]  Other Instructions  Respiratory Syncytial Virus Infection, Adult Respiratory syncytial virus (RSV) infection is an infection caused by RSV, a common virus. This virus is similar to viruses that cause the common cold and the flu. RSV infection can affect the nose, throat, windpipe, and lungs (respiratory system). When the infection is severe, it can cause: Bronchiolitis. This condition causes inflammation of the air passages in the lungs (bronchioles). Pneumonia. This condition causes inflammation of the air sacs in the lungs. RSV infection spreads from person to person (is contagious) through droplets from coughs and sneezes (respiratory secretions). This condition is rarely serious when it occurs in adults. What are the causes? This condition is caused by contact with RSV. This can happen by: Breathing respiratory secretions from someone who has the infection. Touching something that has been exposed to the virus (is contaminated) and then touching your mouth, nose, or eyes. Coming in close contact with someone who has this infection. This may happen if you: Hug or kiss. Shake or hold hands. Eat or drink using the same dishes or utensils. What increases the risk? The following factors may make you more likely to develop this condition: Being 22 years of age or older. Having certain health conditions, including: A long-term (chronic) lung condition, such as chronic obstructive pulmonary disease (COPD). An immune system that is weak. This is your body's defense system. Down syndrome. Heart disease. Working in a hospital or other health care facility. Living in a long-term health care facility. RSV infections are most  common from the months of November to April, but they can happen any time of year. What are the signs or symptoms? Symptoms of this condition include: Having a runny nose. Coughing. You may have a cough that brings up mucus (productive cough). Sneezing. Having a fever. Wanting to eat less than usual. Breathing loudly (wheezing). Having shortness of breath. Having fluid build up in the lungs (respiratory distress). How is this diagnosed? This condition may be diagnosed based on: Your symptoms. Your medical history. A physical exam. A chest X-ray to rule out pneumonia. Blood tests or tests of mucus from your lungs (sputum). These tests may be done for older adults. A test of a sample of your respiratory secretions. How is this treated? In most cases, the RSV infection  will go away after 1-2 weeks of caring for yourself at home.  Sometimes, RSV infection is severe and can cause bronchiolitis or pneumonia. If you develop one or both of these conditions, you may need to be treated in the hospital. You may be given: Oxygen therapy. Antiviral medicine. Medicines to open your bronchioles (bronchodilators). Follow these instructions at home: Medicines Take over-the-counter and prescription medicines only as told by your health care provider. If you were prescribed an antiviral medicine, take it as told by your health care provider. Do not stop using the antiviral even if you start to feel better. Lifestyle  Eat a healthy diet. Do not drink alcohol. Do not use any products that contain nicotine or tobacco, such as cigarettes, e-cigarettes, and chewing tobacco. If you need help quitting, ask your health care provider. Rest at home until your symptoms go away. Return to your normal activities as told by your health care provider. Ask your health care provider what activities are safe for you. General instructions  Drink enough fluid to keep your urine pale yellow. Gargle with a salt-water  mixture 3-4 times a day or as needed. To make a salt-water mixture, completely dissolve -1 tsp (3-6 g) of salt in 1 cup (237 mL) of warm water. Keep all follow-up visits as told by your health care provider. This is important. How is this prevented? To prevent catching and spreading RSV: Wash your hands often with soap and water for at least 20 seconds. If soap and water are not available, use hand sanitizer. Do not touch your face without first cleaning your hands. Stay home if you have symptoms of the common cold or the flu. Cover your nose and mouth when you cough or sneeze. Avoid large groups of people. Keep a safe distance of about 6 feet (1.8 m) from people who are coughing or sneezing. Where to find more information Centers for Disease Control and Prevention: http://www.wolf.info/ Contact a health care provider if: Your symptoms get worse or have not changed after 2 weeks. You have: A fever. Hot flashes, sweating, or chills that keep happening. A cough that brings up much more mucus than usual. A cough that brings up blood. You feel: Very tired (lethargic). Confused. Get help right away if: You have increased or severe trouble breathing. You lose consciousness. These symptoms may represent a serious problem that is an emergency. Do not wait to see if the symptoms will go away. Get medical help right away. Call your local emergency services (911 in the [place] Do not drive yourself to the hospital. Summary Respiratory syncytial virus (RSV) infection is an infection caused by RSV, a common virus. RSV infection can affect the nose, throat, windpipe, and lungs (respiratory system). When the infection is severe, it can cause bronchiolitis or pneumonia. Take over-the-counter and prescription medicines only as told by your health care provider. Contact a health care provider if your symptoms get worse or have not changed after 2 weeks. This information is not intended to replace advice given to  you by your health care provider. Make sure you discuss any questions you have with your health care provider. Document Revised: 08/28/2019 Document Reviewed: 09/04/2019 Elsevier Patient Education  2022 [employer].    If you have been instructed to have an in-person evaluation today at a local Urgent Care facility, please use the link below. It will take you to a list of all of our available [facility], including address, phone number and hours of  operation. Please do not delay care.  [facility]  If you or a family member do not have a primary care provider, use the link below to schedule a visit and establish care. When you choose a [facility] primary care physician or advanced practice provider, you gain a long-term partner in health. Find a Primary Care Provider  Learn more about [facility] in-office and virtual care options: [facility]

## 2022-10-31 NOTE — Progress Notes (Signed)
Virtual Visit Consent   Gina Jefferson, you are scheduled for a virtual visit with a [facility] provider today. Just as with appointments in the office, your consent must be obtained to participate. Your consent will be active for this visit and any virtual visit you may have with one of our providers in the next 365 days. If you have a MyChart account, a copy of this consent can be sent to you electronically.  As this is a virtual visit, video technology does not allow for your provider to perform a traditional examination. This may limit your provider's ability to fully assess your condition. If your provider identifies any concerns that need to be evaluated in person or the need to arrange testing (such as labs, EKG, etc.), we will make arrangements to do so. Although advances in technology are sophisticated, we cannot ensure that it will always work on either your end or our end. If the connection with a video visit is poor, the visit may have to be switched to a telephone visit. With either a video or telephone visit, we are not always able to ensure that we have a secure connection.  By engaging in this virtual visit, you consent to the provision of healthcare and authorize for your insurance to be billed (if applicable) for the services provided during this visit. Depending on your insurance coverage, you may receive a charge related to this service.  I need to obtain your verbal consent now. Are you willing to proceed with your visit today? Gina Jefferson has provided verbal consent on 10/31/2022 for a virtual visit (video or telephone). Mar Daring, PA-C  Date: 10/31/2022 6:10 PM  Virtual Visit via Video Note   I, Mar Daring, connected with  Gina Jefferson  ([id number], [birth year]) on 10/31/22 at  6:00 PM EST by a video-enabled telemedicine application and verified that I am speaking with the correct person using two identifiers.  Location: Patient: Virtual Visit Location  Patient: Home Provider: Virtual Visit Location Provider: Home Office   I discussed the limitations of evaluation and management by telemedicine and the availability of in person appointments. The patient expressed understanding and agreed to proceed.    History of Present Illness: Gina Jefferson is a 71 y.o. who identifies as a female who was assigned female at birth, and is being seen today for cough.  HPI: Cough This is a new problem. The current episode started 1 to 4 weeks ago (60 year old granddaughter that lives with her tested positive for RSV today). The problem has been gradually improving. The problem occurs every few minutes. The cough is Productive of sputum and productive of purulent sputum. Associated symptoms include ear congestion (in the morning when she wakes up), a fever (low grade and intermittent), headaches, myalgias, nasal congestion (mild), postnasal drip (mild), a sore throat (did have last week, but now improved) and shortness of breath. Pertinent negatives include no chest pain, chills, ear pain, rhinorrhea or wheezing. The symptoms are aggravated by lying down (talking). Treatments tried: tylenol, aleve, night time cough and cold, nyquil, robitussin. The treatment provided mild relief. Her past medical history is significant for pneumonia (as a child). There is no history of asthma or bronchitis.     Problems:  Patient Active Problem List   Diagnosis Date Noted   Viral URI 02/14/2022   Neck pain on left side 05/21/2018   Vertigo 12/11/2015   Routine general medical examination at a health care facility  02/12/2015   Impaired fasting blood sugar 01/21/2011   Allergic rhinitis 09/24/2010   GERD (gastroesophageal reflux disease) 06/23/2009   Hyperlipidemia 06/23/2009   Essential hypertension 06/23/2009   Thyromegaly 09/12/2008    Allergies:  Allergies  Allergen Reactions   Clindamycin/Lincomycin Rash   Ciprofloxacin Rash   Penicillins Rash   Statins Other (See  Comments)    Muscle aches   Metformin Diarrhea and Other (See Comments)    Vomiting    Medications:  Current Outpatient Medications:    albuterol (VENTOLIN HFA) 108 (90 Base) MCG/ACT inhaler, Inhale 2 puffs into the lungs every 6 (six) hours as needed for wheezing or shortness of breath., Disp: 8 g, Rfl: 0   benzonatate (TESSALON) 100 MG capsule, Take 1 capsule (100 mg total) by mouth 3 (three) times daily as needed., Disp: 30 capsule, Rfl: 0   predniSONE (DELTASONE) 20 MG tablet, Take 2 tablets (40 mg total) by mouth daily with breakfast., Disp: 10 tablet, Rfl: 0   promethazine-dextromethorphan (PROMETHAZINE-DM) 6.25-15 MG/5ML syrup, Take 5 mLs by mouth 4 (four) times daily as needed., Disp: 118 mL, Rfl: 0   ALPRAZolam (XANAX) 0.25 MG tablet, Take 1 tablet (0.25 mg total) by mouth daily as needed for anxiety., Disp: 10 tablet, Rfl: 0   Ascorbic Acid (VITAMIN C) 500 MG CAPS, Take 500 mg by mouth daily., Disp: , Rfl:    Biotin 1000 MCG tablet, Take 1,000 mcg by mouth daily., Disp: , Rfl:    cetirizine (ZYRTEC) 10 MG tablet, Take 10 mg by mouth daily., Disp: , Rfl:    Cholecalciferol (VITAMIN D PO), Take 5,000 Units by mouth daily., Disp: , Rfl:    diclofenac (VOLTAREN) 50 MG EC tablet, Take 50 mg by mouth daily as needed., Disp: , Rfl:    Diclofenac Sodium 3 % GEL, Reported on 05/05/2016, Disp: , Rfl: 98   dicyclomine (BENTYL) 20 MG tablet, TAKE 1 TABLET(20 MG) BY MOUTH TWICE DAILY AS NEEDED FOR SPASMS OR ABDOMINAL PAIN, Disp: 180 tablet, Rfl: 0   Garlic 5176 MG CAPS, Take by mouth daily., Disp: , Rfl:    lansoprazole (PREVACID) 30 MG capsule, TAKE 1 CAPSULE(30 MG) BY MOUTH AT BEDTIME, Disp: 90 capsule, Rfl: 3   meclizine (ANTIVERT) 12.5 MG tablet, Take 1 tablet (12.5 mg total) by mouth 3 (three) times daily as needed for dizziness., Disp: 60 tablet, Rfl: 0   ondansetron (ZOFRAN) 4 MG tablet, 1 tablet by mouth every 6 to 8 hours as needed for nausea, Disp: 30 tablet, Rfl: 1   traZODone (DESYREL)  50 MG tablet, TAKE 1/2 TO 1 TABLET(25 TO 50 MG) BY MOUTH AT BEDTIME AS NEEDED FOR SLEEP, Disp: 30 tablet, Rfl: 3   valsartan (DIOVAN) 320 MG tablet, Take 1 tablet (320 mg total) by mouth daily., Disp: 90 tablet, Rfl: 3  Observations/Objective: Patient is well-developed, well-nourished in no acute distress.  Resting comfortably at home.  Head is normocephalic, atraumatic.  No labored breathing.  Speech is clear and coherent with logical content.  Patient is alert and oriented at baseline.  Dry, hacking cough heard frequently but not limiting speech  Assessment and Plan: 1. RSV bronchitis - albuterol (VENTOLIN HFA) 108 (90 Base) MCG/ACT inhaler; Inhale 2 puffs into the lungs every 6 (six) hours as needed for wheezing or shortness of breath.  Dispense: 8 g; Refill: 0 - predniSONE (DELTASONE) 20 MG tablet; Take 2 tablets (40 mg total) by mouth daily with breakfast.  Dispense: 10 tablet; Refill: 0 - promethazine-dextromethorphan (PROMETHAZINE-DM)  6.25-15 MG/5ML syrup; Take 5 mLs by mouth 4 (four) times daily as needed.  Dispense: 118 mL; Refill: 0 - benzonatate (TESSALON) 100 MG capsule; Take 1 capsule (100 mg total) by mouth 3 (three) times daily as needed.  Dispense: 30 capsule; Refill: 0  - Worsening over a week despite OTC medications - Will treat with Albuterol, prednisone, Promethazine DMand tessalon perles - Can continue Mucinex  - Push fluids.  - Rest.  - Steam and humidifier can help - Seek in person evaluation if worsening or symptoms fail to improve    Follow Up Instructions: I discussed the assessment and treatment plan with the patient. The patient was provided an opportunity to ask questions and all were answered. The patient agreed with the plan and demonstrated an understanding of the instructions.  A copy of instructions were sent to the patient via MyChart unless otherwise noted below.    The patient was advised to call back or seek an in-person evaluation if the symptoms  worsen or if the condition fails to improve as anticipated.  Time:  I spent 11 minutes with the patient via telehealth technology discussing the above problems/concerns.    Mar Daring, PA-C

## 2022-11-14 DIAGNOSIS — M19072 Primary osteoarthritis, left ankle and foot: Secondary | ICD-10-CM | POA: Diagnosis not present

## 2022-11-27 ENCOUNTER — Other Ambulatory Visit: Payer: Self-pay | Admitting: Internal Medicine

## 2022-12-27 DIAGNOSIS — Z1231 Encounter for screening mammogram for malignant neoplasm of breast: Secondary | ICD-10-CM | POA: Diagnosis not present

## 2022-12-27 DIAGNOSIS — M8588 Other specified disorders of bone density and structure, other site: Secondary | ICD-10-CM | POA: Diagnosis not present

## 2023-01-18 ENCOUNTER — Telehealth: Payer: Medicare Other | Admitting: Physician Assistant

## 2023-01-18 ENCOUNTER — Encounter: Payer: Medicare Other | Admitting: Nurse Practitioner

## 2023-01-18 DIAGNOSIS — J069 Acute upper respiratory infection, unspecified: Secondary | ICD-10-CM | POA: Diagnosis not present

## 2023-01-18 MED ORDER — FLUTICASONE PROPIONATE 50 MCG/ACT NA SUSP: 2.0000 | Freq: Every day | NASAL | 0 refills | Status: DC

## 2023-01-18 MED ORDER — PROMETHAZINE-DM 6.25-15 MG/5ML PO SYRP
5.0000 mL | ORAL_SOLUTION | Freq: Four times a day (QID) | ORAL | 0 refills | Status: DC | PRN
Start: 2023-01-18 — End: 2023-05-01

## 2023-01-18 NOTE — Progress Notes (Signed)
E-Visit for Upper Respiratory Infection   We are sorry you are not feeling well.  Here is how we plan to help!  Based on what you have shared with me, it looks like you may have a viral upper respiratory infection.  Upper respiratory infections are caused by a large number of viruses; however, rhinovirus is the most common cause.   Symptoms vary from person to person, with common symptoms including sore throat, cough, fatigue or lack of energy and feeling of general discomfort.  A low-grade fever of up to 100.4 may present, but is often uncommon.  Symptoms vary however, and are closely related to a person's age or underlying illnesses.  The most common symptoms associated with an upper respiratory infection are nasal discharge or congestion, cough, sneezing, headache and pressure in the ears and face.  These symptoms usually persist for about 3 to 10 days, but can last up to 2 weeks.  It is important to know that upper respiratory infections do not cause serious illness or complications in most cases.    Upper respiratory infections can be transmitted from person to person, with the most common method of transmission being a person's hands.  The virus is able to live on the skin and can infect other persons for up to 2 hours after direct contact.  Also, these can be transmitted when someone coughs or sneezes; thus, it is important to cover the mouth to reduce this risk.  To keep the spread of the illness at [place], good hand hygiene is very important.  This is an infection that is most likely caused by a virus. There are no specific treatments other than to help you with the symptoms until the infection runs its course.  We are sorry you are not feeling well.  Here is how we plan to help!   For nasal congestion, you may use an oral decongestants such as Mucinex D or if you have glaucoma or high blood pressure use plain Mucinex.  Saline nasal spray or nasal drops can help and can safely be used as often as  needed for congestion.  For your congestion, I have prescribed Fluticasone nasal spray one spray in each nostril twice a day  If you do not have a history of heart disease, hypertension, diabetes or thyroid disease, prostate/bladder issues or glaucoma, you may also use Sudafed to treat nasal congestion.  It is highly recommended that you consult with a pharmacist or your primary care physician to ensure this medication is safe for you to take.     If you have a cough, you may use cough suppressants such as Delsym and Robitussin.  If you have glaucoma or high blood pressure, you can also use Coricidin HBP.   For cough I have prescribed for you Promethazine DM Take 17m every 6-8 hours as needed for cough  If you have a sore or scratchy throat, use a saltwater gargle-  to  teaspoon of salt dissolved in a 4-ounce to 8-ounce glass of warm water.  Gargle the solution for approximately 15-30 seconds and then spit.  It is important not to swallow the solution.  You can also use throat lozenges/cough drops and Chloraseptic spray to help with throat pain or discomfort.  Warm or cold liquids can also be helpful in relieving throat pain.  For headache, pain or general discomfort, you can use Ibuprofen or Tylenol as directed.   Some authorities believe that zinc sprays or the use of Echinacea may shorten  the course of your symptoms.   HOME CARE Only take medications as instructed by your medical team. Be sure to drink plenty of fluids. Water is fine as well as fruit juices, sodas and electrolyte beverages. You may want to stay away from caffeine or alcohol. If you are nauseated, try taking small sips of liquids. How do you know if you are getting enough fluid? Your urine should be a pale yellow or almost colorless. Get rest. Taking a steamy shower or using a humidifier may help nasal congestion and ease sore throat pain. You can place a towel over your head and breathe in the steam from hot water coming from a  faucet. Using a saline nasal spray works much the same way. Cough drops, hard candies and sore throat lozenges may ease your cough. Avoid close contacts especially the very young and the elderly Cover your mouth if you cough or sneeze Always remember to wash your hands.   GET HELP RIGHT AWAY IF: You develop worsening fever. If your symptoms do not improve within 10 days You develop yellow or green discharge from your nose over 3 days. You have coughing fits You develop a severe head ache or visual changes. You develop shortness of breath, difficulty breathing or start having chest pain Your symptoms persist after you have completed your treatment plan  MAKE SURE YOU  Understand these instructions. Will watch your condition. Will get help right away if you are not doing well or get worse.  Thank you for choosing an e-visit.  Your e-visit answers were reviewed by a board certified advanced clinical practitioner to complete your personal care plan. Depending upon the condition, your plan could have included both over the counter or prescription medications.  Please review your pharmacy choice. Make sure the pharmacy is open so you can pick up prescription now. If there is a problem, you may contact your provider through [employer] and have the prescription routed to another pharmacy.  Your safety is important to [place]. If you have drug allergies check your prescription carefully.   For the next 24 hours you can use MyChart to ask questions about today's visit, request a non-urgent call back, or ask for a work or school excuse. You will get an email in the next two days asking about your experience. I hope that your e-visit has been valuable and will speed your recovery.    I have spent 5 minutes in review of e-visit questionnaire, review and updating patient chart, medical decision making and response to patient.   Mar Daring, PA-C

## 2023-01-18 NOTE — Progress Notes (Signed)
[place],   Thank you for that information. I am going to cancel this appointment so you are not charged. Once you have tested please schedule a follow up E-visit is OK if negative  Virtual Urgent Care (Video) would be preferred if you are positive.    Thank you Apolonio Schneiders

## 2023-01-20 DIAGNOSIS — M19072 Primary osteoarthritis, left ankle and foot: Secondary | ICD-10-CM | POA: Diagnosis not present

## 2023-01-22 ENCOUNTER — Telehealth: Payer: Medicare Other | Admitting: Nurse Practitioner

## 2023-01-22 ENCOUNTER — Telehealth: Payer: Medicare Other | Admitting: Physician Assistant

## 2023-01-22 DIAGNOSIS — J069 Acute upper respiratory infection, unspecified: Secondary | ICD-10-CM

## 2023-01-22 MED ORDER — AZITHROMYCIN 250 MG PO TABS: ORAL_TABLET | ORAL | 0 refills | Status: AC

## 2023-01-22 MED ORDER — PREDNISONE 20 MG PO TABS: 40.0000 mg | ORAL_TABLET | Freq: Every day | ORAL | 0 refills | Status: AC

## 2023-01-22 NOTE — Progress Notes (Signed)
Virtual Visit Consent   Gina Jefferson, you are scheduled for a virtual visit with a [facility] provider today. Just as with appointments in the office, your consent must be obtained to participate. Your consent will be active for this visit and any virtual visit you may have with one of our providers in the next 365 days. If you have a MyChart account, a copy of this consent can be sent to you electronically.  As this is a virtual visit, video technology does not allow for your provider to perform a traditional examination. This may limit your provider's ability to fully assess your condition. If your provider identifies any concerns that need to be evaluated in person or the need to arrange testing (such as labs, EKG, etc.), we will make arrangements to do so. Although advances in technology are sophisticated, we cannot ensure that it will always work on either your end or our end. If the connection with a video visit is poor, the visit may have to be switched to a telephone visit. With either a video or telephone visit, we are not always able to ensure that we have a secure connection.  By engaging in this virtual visit, you consent to the provision of healthcare and authorize for your insurance to be billed (if applicable) for the services provided during this visit. Depending on your insurance coverage, you may receive a charge related to this service.  I need to obtain your verbal consent now. Are you willing to proceed with your visit today? Yes Gina Jefferson has provided verbal consent on 01/22/2023 for a virtual visit (video or telephone). Tish Men, NP  Date: 01/22/2023 2:48 PM  Virtual Visit via Video Note   I, Gina Jefferson, connected with  Gina Jefferson  ([id number], [birth year]) on 01/22/23 at  2:00 PM EST by a video-enabled telemedicine application and verified that I am speaking with the correct person using two identifiers.  Location: Patient: Virtual Visit  Location Patient: Home Provider: Virtual Visit Location Provider: Home   I discussed the limitations of evaluation and management by telemedicine and the availability of in person appointments. The patient expressed understanding and agreed to proceed.    History of Present Illness: Gina Jefferson is a 71 y.o. who identifies as a female who was assigned female at birth, and is being seen today for persistent cough and blood sputum. The patient reports she began ill since 01/18/23 and has completed 2 evisits since that time. She states when she is coughing, she coughs up yellow sputum and it has been blood tinged at times. She continues to have head congestion. She reports a  low-grade temperature around 99. She denies chills, sore throat, SOB, difficulty breathing or GI symptoms. She has been taking OTC medications along with flonase and Tylenol. She was diagnosed with RSV in December. She has also been using an albuterol inhaler that she received from a previous video visit. She reports she took a COVID test on the first day of her symptoms, which was negative.  HPI: HPI  Problems:  Patient Active Problem List   Diagnosis Date Noted   Viral URI 02/14/2022   Neck pain on left side 05/21/2018   Vertigo 12/11/2015   Routine general medical examination at a health care facility 02/12/2015   Impaired fasting blood sugar 01/21/2011   Allergic rhinitis 09/24/2010   GERD (gastroesophageal reflux disease) 06/23/2009   Hyperlipidemia 06/23/2009   Essential hypertension 06/23/2009   Thyromegaly 09/12/2008  Allergies:  Allergies  Allergen Reactions   Clindamycin/Lincomycin Rash   Ciprofloxacin Rash   Penicillins Rash   Statins Other (See Comments)    Muscle aches   Metformin Diarrhea and Other (See Comments)    Vomiting    Medications:  Current Outpatient Medications:    [START ON 01/25/2023] azithromycin (ZITHROMAX) 250 MG tablet, Take 2 tablets on day 1, then 1 tablet daily on days 2  through 5, Disp: 6 tablet, Rfl: 0   predniSONE (DELTASONE) 20 MG tablet, Take 2 tablets (40 mg total) by mouth daily with breakfast for 5 days., Disp: 10 tablet, Rfl: 0   albuterol (VENTOLIN HFA) 108 (90 Base) MCG/ACT inhaler, Inhale 2 puffs into the lungs every 6 (six) hours as needed for wheezing or shortness of breath., Disp: 8 g, Rfl: 0   ALPRAZolam (XANAX) 0.25 MG tablet, Take 1 tablet (0.25 mg total) by mouth daily as needed for anxiety., Disp: 10 tablet, Rfl: 0   Ascorbic Acid (VITAMIN C) 500 MG CAPS, Take 500 mg by mouth daily., Disp: , Rfl:    Biotin 1000 MCG tablet, Take 1,000 mcg by mouth daily., Disp: , Rfl:    cetirizine (ZYRTEC) 10 MG tablet, Take 10 mg by mouth daily., Disp: , Rfl:    Cholecalciferol (VITAMIN D PO), Take 5,000 Units by mouth daily., Disp: , Rfl:    diclofenac (VOLTAREN) 50 MG EC tablet, Take 50 mg by mouth daily as needed., Disp: , Rfl:    Diclofenac Sodium 3 % GEL, Reported on 05/05/2016, Disp: , Rfl: 98   dicyclomine (BENTYL) 20 MG tablet, TAKE 1 TABLET(20 MG) BY MOUTH TWICE DAILY AS NEEDED FOR SPASMS OR ABDOMINAL PAIN, Disp: 180 tablet, Rfl: 0   fluticasone (FLONASE) 50 MCG/ACT nasal spray, Place 2 sprays into both nostrils daily., Disp: 16 g, Rfl: 0   Garlic 123XX123 MG CAPS, Take by mouth daily., Disp: , Rfl:    lansoprazole (PREVACID) 30 MG capsule, TAKE 1 CAPSULE(30 MG) BY MOUTH AT BEDTIME, Disp: 90 capsule, Rfl: 3   meclizine (ANTIVERT) 12.5 MG tablet, Take 1 tablet (12.5 mg total) by mouth 3 (three) times daily as needed for dizziness., Disp: 60 tablet, Rfl: 0   ondansetron (ZOFRAN) 4 MG tablet, 1 tablet by mouth every 6 to 8 hours as needed for nausea, Disp: 30 tablet, Rfl: 1   promethazine-dextromethorphan (PROMETHAZINE-DM) 6.25-15 MG/5ML syrup, Take 5 mLs by mouth 4 (four) times daily as needed., Disp: 118 mL, Rfl: 0   traZODone (DESYREL) 50 MG tablet, TAKE 1/2 TO 1 TABLET(25 TO 50 MG) BY MOUTH AT BEDTIME AS NEEDED FOR SLEEP, Disp: 30 tablet, Rfl: 3   valsartan  (DIOVAN) 320 MG tablet, Take 1 tablet (320 mg total) by mouth daily., Disp: 90 tablet, Rfl: 3  Observations/Objective: Patient is well-developed, well-nourished in no acute distress.  Resting comfortably at home.  Head is normocephalic, atraumatic.  No labored breathing.  Speech is clear and coherent with logical content.  Patient is alert and oriented at baseline.    Assessment and Plan: 1. Viral upper respiratory tract infection with cough - predniSONE (DELTASONE) 20 MG tablet; Take 2 tablets (40 mg total) by mouth daily with breakfast for 5 days.  Dispense: 10 tablet; Refill: 0  Suspect a continued viral upper respiratory infection with cough. Prednisone 40 mg was prescribed to help with cough. Advised patient if symptoms do not improve over the next 3 days, Azithromycin 250 mg has been sent to her pharmacy to begin. Supportive care recommendations  were provided to the patient.Recommended repeat home COVID test. Discussed strict follow-up precautions with the patient. The patient verbalizes understanding and is in agreement with this plan of care. All questions were answered.   Follow Up Instructions: I discussed the assessment and treatment plan with the patient. The patient was provided an opportunity to ask questions and all were answered. The patient agreed with the plan and demonstrated an understanding of the instructions.  A copy of instructions were sent to the patient via MyChart unless otherwise noted below.   The patient was advised to call back or seek an in-person evaluation if the symptoms worsen or if the condition fails to improve as anticipated.  Time:  I spent 20 minutes with the patient via telehealth technology discussing the above problems/concerns.    Tish Men, NP

## 2023-01-22 NOTE — Progress Notes (Signed)
Given length of symptoms recommend continued supportive care.    Given persistent sx and bloody sputum recommend evaluation in person.    NOTE: There will be NO CHARGE for this eVisit   If you are having a true medical emergency please call 911.      For an urgent face to face visit, [facility] has eight urgent care centers for your convenience:   NEW!! [facility] [place] [facility] [place] Get Driving Directions [id number] [address] [place], [place] [facility] [place] [facility] [place] Get Driving Directions [id number] [place], [place] [address]   [place] [facility]) Get Driving Directions [id number] [address], [place] [address]  [facility] [place] ([place]) Get Driving Directions [id number] [address] [place],  [place]  [address]  [place] [facility] - at [address] [place] Get Driving Directions  [id number] [phone] [address] [place],  [place] [address]   [facility] Get Driving Directions [id number] [place], [place], [place] [address]   [facility] Get Driving Directions  [id number] [address] [place], [place] [address]   [facility] [place] Get Driving Directions [id number] [address], [place], [place] [address]  Your MyChart E-visit questionnaire answers were reviewed by a board certified advanced clinical practitioner to complete your personal care plan based on your specific symptoms.  Thank you for using e-Visits.

## 2023-01-22 NOTE — Patient Instructions (Signed)
Gina Jefferson Auth, thank you for joining Tish Men, NP for today'Jefferson virtual visit.  While this provider is not your primary care provider (PCP), if your PCP is located in our provider database this encounter information will be shared with them immediately following your visit.   [facility] account gives you access to today'[facility]erson visit and all your visits, tests, and labs performed at [facility] " click here if you don't have a [facility] account or go to [email or web address]  Consent: (Patient) Gina Jefferson provided verbal consent for this virtual visit at the beginning of the encounter.  Current Medications:  Current Outpatient Medications:    [START ON 01/25/2023] azithromycin (ZITHROMAX) 250 MG tablet, Take 2 tablets on day 1, then 1 tablet daily on days 2 through 5, Disp: 6 tablet, Rfl: 0   predniSONE (DELTASONE) 20 MG tablet, Take 2 tablets (40 mg total) by mouth daily with breakfast for 5 days., Disp: 10 tablet, Rfl: 0   albuterol (VENTOLIN HFA) 108 (90 Base) MCG/ACT inhaler, Inhale 2 puffs into the lungs every 6 (six) hours as needed for wheezing or shortness of breath., Disp: 8 g, Rfl: 0   ALPRAZolam (XANAX) 0.25 MG tablet, Take 1 tablet (0.25 mg total) by mouth daily as needed for anxiety., Disp: 10 tablet, Rfl: 0   Ascorbic Acid (VITAMIN C) 500 MG CAPS, Take 500 mg by mouth daily., Disp: , Rfl:    Biotin 1000 MCG tablet, Take 1,000 mcg by mouth daily., Disp: , Rfl:    cetirizine (ZYRTEC) 10 MG tablet, Take 10 mg by mouth daily., Disp: , Rfl:    Cholecalciferol (VITAMIN D PO), Take 5,000 Units by mouth daily., Disp: , Rfl:    diclofenac (VOLTAREN) 50 MG EC tablet, Take 50 mg by mouth daily as needed., Disp: , Rfl:    Diclofenac Sodium 3 % GEL, Reported on 05/05/2016, Disp: , Rfl: 98   dicyclomine (BENTYL) 20 MG tablet, TAKE 1 TABLET(20 MG) BY MOUTH TWICE DAILY AS NEEDED FOR SPASMS OR ABDOMINAL PAIN, Disp: 180 tablet, Rfl: 0   fluticasone  (FLONASE) 50 MCG/ACT nasal spray, Place 2 sprays into both nostrils daily., Disp: 16 g, Rfl: 0   Garlic 123XX123 MG CAPS, Take by mouth daily., Disp: , Rfl:    lansoprazole (PREVACID) 30 MG capsule, TAKE 1 CAPSULE(30 MG) BY MOUTH AT BEDTIME, Disp: 90 capsule, Rfl: 3   meclizine (ANTIVERT) 12.5 MG tablet, Take 1 tablet (12.5 mg total) by mouth 3 (three) times daily as needed for dizziness., Disp: 60 tablet, Rfl: 0   ondansetron (ZOFRAN) 4 MG tablet, 1 tablet by mouth every 6 to 8 hours as needed for nausea, Disp: 30 tablet, Rfl: 1   promethazine-dextromethorphan (PROMETHAZINE-DM) 6.25-15 MG/5ML syrup, Take 5 mLs by mouth 4 (four) times daily as needed., Disp: 118 mL, Rfl: 0   traZODone (DESYREL) 50 MG tablet, TAKE 1/2 TO 1 TABLET(25 TO 50 MG) BY MOUTH AT BEDTIME AS NEEDED FOR SLEEP, Disp: 30 tablet, Rfl: 3   valsartan (DIOVAN) 320 MG tablet, Take 1 tablet (320 mg total) by mouth daily., Disp: 90 tablet, Rfl: 3   Medications ordered in this encounter:  Meds ordered this encounter  Medications   predniSONE (DELTASONE) 20 MG tablet    Sig: Take 2 tablets (40 mg total) by mouth daily with breakfast for 5 days.    Dispense:  10 tablet    Refill:  0   azithromycin (ZITHROMAX) 250 MG tablet    Sig:  Take 2 tablets on day 1, then 1 tablet daily on days 2 through 5    Dispense:  6 tablet    Refill:  0     *If you need refills on other medications prior to your next appointment, please contact your pharmacy*  Follow-Up: Call back or seek an in-person evaluation if the symptoms worsen or if the condition fails to improve as anticipated.  [place] [phone]  Other Instructions Take medication as prescribed. Increase fluids and allow for plenty of rest. Recommend Tylenol or ibuprofen as needed for pain, fever, or general discomfort. Warm salt water gargles 3-4 times daily to help with throat pain or discomfort. As discussed, if your symptoms do not improve by 01/25/2023, pick up  your prescription for the antibiotic at your preferred pharmacy. Recommend using a humidifier at bedtime during sleep to help with cough and nasal congestion. Sleep elevated on 2 pillows. If your symptoms fail to improve with this treatment, please follow-up with your primary care provider or at a local urgent care for further evaluation.    If you have been instructed to have an in-person evaluation today at a local Urgent Care facility, please use the link below. It will take you to a list of all of our available [facility], including address, phone number and hours of operation. Please do not delay care.  [facility]  If you or a family member do not have a primary care provider, use the link below to schedule a visit and establish care. When you choose a [facility] primary care physician or advanced practice provider, you gain a long-term partner in health. Find a Primary Care Provider  Learn more about [facility] in-office and virtual care options: [facility]

## 2023-02-02 ENCOUNTER — Ambulatory Visit (INDEPENDENT_AMBULATORY_CARE_PROVIDER_SITE_OTHER): Payer: Medicare Other | Admitting: Internal Medicine

## 2023-02-02 ENCOUNTER — Ambulatory Visit (INDEPENDENT_AMBULATORY_CARE_PROVIDER_SITE_OTHER)
Admission: RE | Admit: 2023-02-02 | Discharge: 2023-02-02 | Disposition: A | Discharge status: Home / Self Care | Payer: Medicare Other | Source: Ambulatory Visit | Attending: Internal Medicine | Admitting: Internal Medicine

## 2023-02-02 ENCOUNTER — Ambulatory Visit: Payer: Medicare Other

## 2023-02-02 ENCOUNTER — Encounter: Payer: Self-pay | Admitting: Internal Medicine

## 2023-02-02 VITALS — BP 128/60 | HR 97 | Temp 98.2°F | Ht 61.0 in

## 2023-02-02 DIAGNOSIS — R052 Subacute cough: Secondary | ICD-10-CM

## 2023-02-02 DIAGNOSIS — R059 Cough, unspecified: Secondary | ICD-10-CM | POA: Diagnosis not present

## 2023-02-02 MED ORDER — BENZONATATE 200 MG PO CAPS: 200.0000 mg | ORAL_CAPSULE | Freq: Three times a day (TID) | ORAL | 0 refills | Status: DC | PRN

## 2023-02-02 NOTE — Progress Notes (Signed)
   Subjective:   Patient ID: Gina Jefferson, female    DOB: 1951/11/18, 72 y.o.   MRN: [id number]  HPI The patient is a 72 YO female coming in for cold about 3 weeks ago. She did 3 e-visits and 1 video visit and was eventually given prednisone and azithromycin. Still coughing and some sputum. Felt medications helped and then cough returned. Not using flonase.  Review of Systems  Constitutional:  Negative for activity change, appetite change, chills, fatigue, fever and unexpected weight change.  HENT:  Positive for congestion, postnasal drip, rhinorrhea and sinus pressure. Negative for ear discharge, ear pain, sinus pain, sneezing, sore throat, tinnitus, trouble swallowing and voice change.   Eyes: Negative.   Respiratory:  Positive for cough. Negative for chest tightness, shortness of breath and wheezing.   Cardiovascular: Negative.   Gastrointestinal: Negative.   Musculoskeletal:  Negative for myalgias.  Neurological: Negative.     Objective:  Physical Exam Constitutional:      Appearance: She is well-developed.  HENT:     Head: Normocephalic and atraumatic.     Comments: Oropharynx with redness and clear drainage, nose with swollen turbinates, TMs normal bilaterally.  Neck:     Thyroid: No thyromegaly.  Cardiovascular:     Rate and Rhythm: Normal rate and regular rhythm.  Pulmonary:     Effort: Pulmonary effort is normal. No respiratory distress.     Breath sounds: Normal breath sounds. No wheezing or rales.  Abdominal:     Palpations: Abdomen is soft.  Musculoskeletal:        General: No tenderness.     Cervical back: Normal range of motion.  Lymphadenopathy:     Cervical: No cervical adenopathy.  Skin:    General: Skin is warm and dry.  Neurological:     Mental Status: She is alert and oriented to person, place, and time.     Vitals:   02/02/23 1046  BP: 128/60  Pulse: 97  Temp: 98.2 F (36.8 C)  TempSrc: Oral  SpO2: 97%  Height: '5\' 1"'$  (1.549 m)     Assessment & Plan:  Visit time 15 minutes in face to face communication with patient and coordination of care, additional 5 minutes spent in record review, coordination or care, ordering tests, communicating/referring to other healthcare professionals, documenting in medical records all on the same day of the visit for total time 20 minutes spent on the visit.

## 2023-02-02 NOTE — Patient Instructions (Signed)
We will check the x-ray today and have sent in the tessalon perles to use up to 3 times a day.

## 2023-02-03 DIAGNOSIS — R052 Subacute cough: Secondary | ICD-10-CM | POA: Insufficient documentation

## 2023-02-03 NOTE — Assessment & Plan Note (Signed)
Rx tessalon perles and checking CXR. If changes will add antibiotics. If normal suspect post-viral and counseling given that this could continue for several more weeks.

## 2023-04-26 ENCOUNTER — Other Ambulatory Visit: Payer: Self-pay | Admitting: Internal Medicine

## 2023-04-26 MED ORDER — MECLIZINE HCL 12.5 MG PO TABS: 12.5000 mg | ORAL_TABLET | Freq: Three times a day (TID) | ORAL | 0 refills | Status: AC | PRN

## 2023-05-01 ENCOUNTER — Ambulatory Visit (INDEPENDENT_AMBULATORY_CARE_PROVIDER_SITE_OTHER): Payer: Medicare Other | Admitting: Internal Medicine

## 2023-05-01 ENCOUNTER — Encounter: Payer: Self-pay | Admitting: Internal Medicine

## 2023-05-01 VITALS — BP 160/90 | HR 64 | Temp 98.5°F | Ht 61.0 in | Wt 139.0 lb

## 2023-05-01 DIAGNOSIS — I10 Essential (primary) hypertension: Secondary | ICD-10-CM

## 2023-05-01 DIAGNOSIS — F4322 Adjustment disorder with anxiety: Secondary | ICD-10-CM

## 2023-05-01 MED ORDER — ALPRAZOLAM 0.25 MG PO TABS: 0.2500 mg | ORAL_TABLET | Freq: Every day | ORAL | 0 refills | Status: DC | PRN

## 2023-05-01 MED ORDER — TRAZODONE HCL 50 MG PO TABS: ORAL_TABLET | ORAL | 3 refills | Status: AC

## 2023-05-01 NOTE — Progress Notes (Unsigned)
   Subjective:   Patient ID: Gina Jefferson, female    DOB: March 14, 1951, 72 y.o.   MRN: [id number]  HPI The patient is a 72 YO female coming in for stress and BP up and down. Some rare headaches denies chest pains.  Review of Systems  Constitutional: Negative.   HENT: Negative.    Eyes: Negative.   Respiratory:  Negative for cough, chest tightness and shortness of breath.   Cardiovascular:  Negative for chest pain, palpitations and leg swelling.  Gastrointestinal:  Negative for abdominal distention, abdominal pain, constipation, diarrhea, nausea and vomiting.  Musculoskeletal: Negative.   Skin: Negative.   Neurological:  Positive for headaches.  Psychiatric/Behavioral: Negative.      Objective:  Physical Exam Constitutional:      Appearance: She is well-developed.  HENT:     Head: Normocephalic and atraumatic.  Cardiovascular:     Rate and Rhythm: Normal rate and regular rhythm.  Pulmonary:     Effort: Pulmonary effort is normal. No respiratory distress.     Breath sounds: Normal breath sounds. No wheezing or rales.  Abdominal:     General: Bowel sounds are normal. There is no distension.     Palpations: Abdomen is soft.     Tenderness: There is no abdominal tenderness. There is no rebound.  Musculoskeletal:     Cervical back: Normal range of motion.  Skin:    General: Skin is warm and dry.  Neurological:     Mental Status: She is alert and oriented to person, place, and time.     Coordination: Coordination normal.     Vitals:   05/01/23 1043 05/01/23 1047  BP: (!) 160/90 (!) 160/90  Pulse: 64   Temp: 98.5 F (36.9 C)   TempSrc: Oral   SpO2: 95%   Weight: 139 lb (63 kg)   Height: 5\' 1"  (1.549 m)    EKG: Rate 88, axis normal, interval normal, sinus with PVCs, no st or t wave changes, no significant change compared to prior 2019   Assessment & Plan:  Visit time 25 minutes in face to face communication with patient and coordination of care, additional 5 minutes  spent in record review, coordination or care, ordering tests, communicating/referring to other healthcare professionals, documenting in medical records all on the same day of the visit for total time 30 minutes spent on the visit.

## 2023-05-01 NOTE — Patient Instructions (Signed)
The EKG looks normal. You do have a few early heart beats called PVCs but these are not dangerous.

## 2023-05-02 ENCOUNTER — Encounter: Payer: Self-pay | Admitting: Internal Medicine

## 2023-05-02 NOTE — Assessment & Plan Note (Signed)
Dealing with upcoming trial for son's murderer and this is causing problems sleeping and stress. Rx trazodone 50 mg qhs prn for sleep which has worked well and she uses intermittently. Also rx alprazolam 0.25 mg daily prn which she uses very rarely to help with stress. Encouraged physical activity to help with stress and sleep and she will consider counseling as well.

## 2023-05-02 NOTE — Assessment & Plan Note (Signed)
EKG done and stable from prior. BP is moderately elevated today and she is tracking at home. Having very poor sleep lately and stressed. We will treat the stress and sleep and if BP still labile can add agent to help. For now will keep valsartan 320 mg daily.

## 2023-05-04 IMAGING — DX DG CHEST 2V
2 series · 2 of 2 positions shown · non-contrast
Comparison: 06/13/2008

CLINICAL DATA: Cough, chest congestion, dyspnea

EXAM:
CHEST - 2 VIEW

[chest pa]
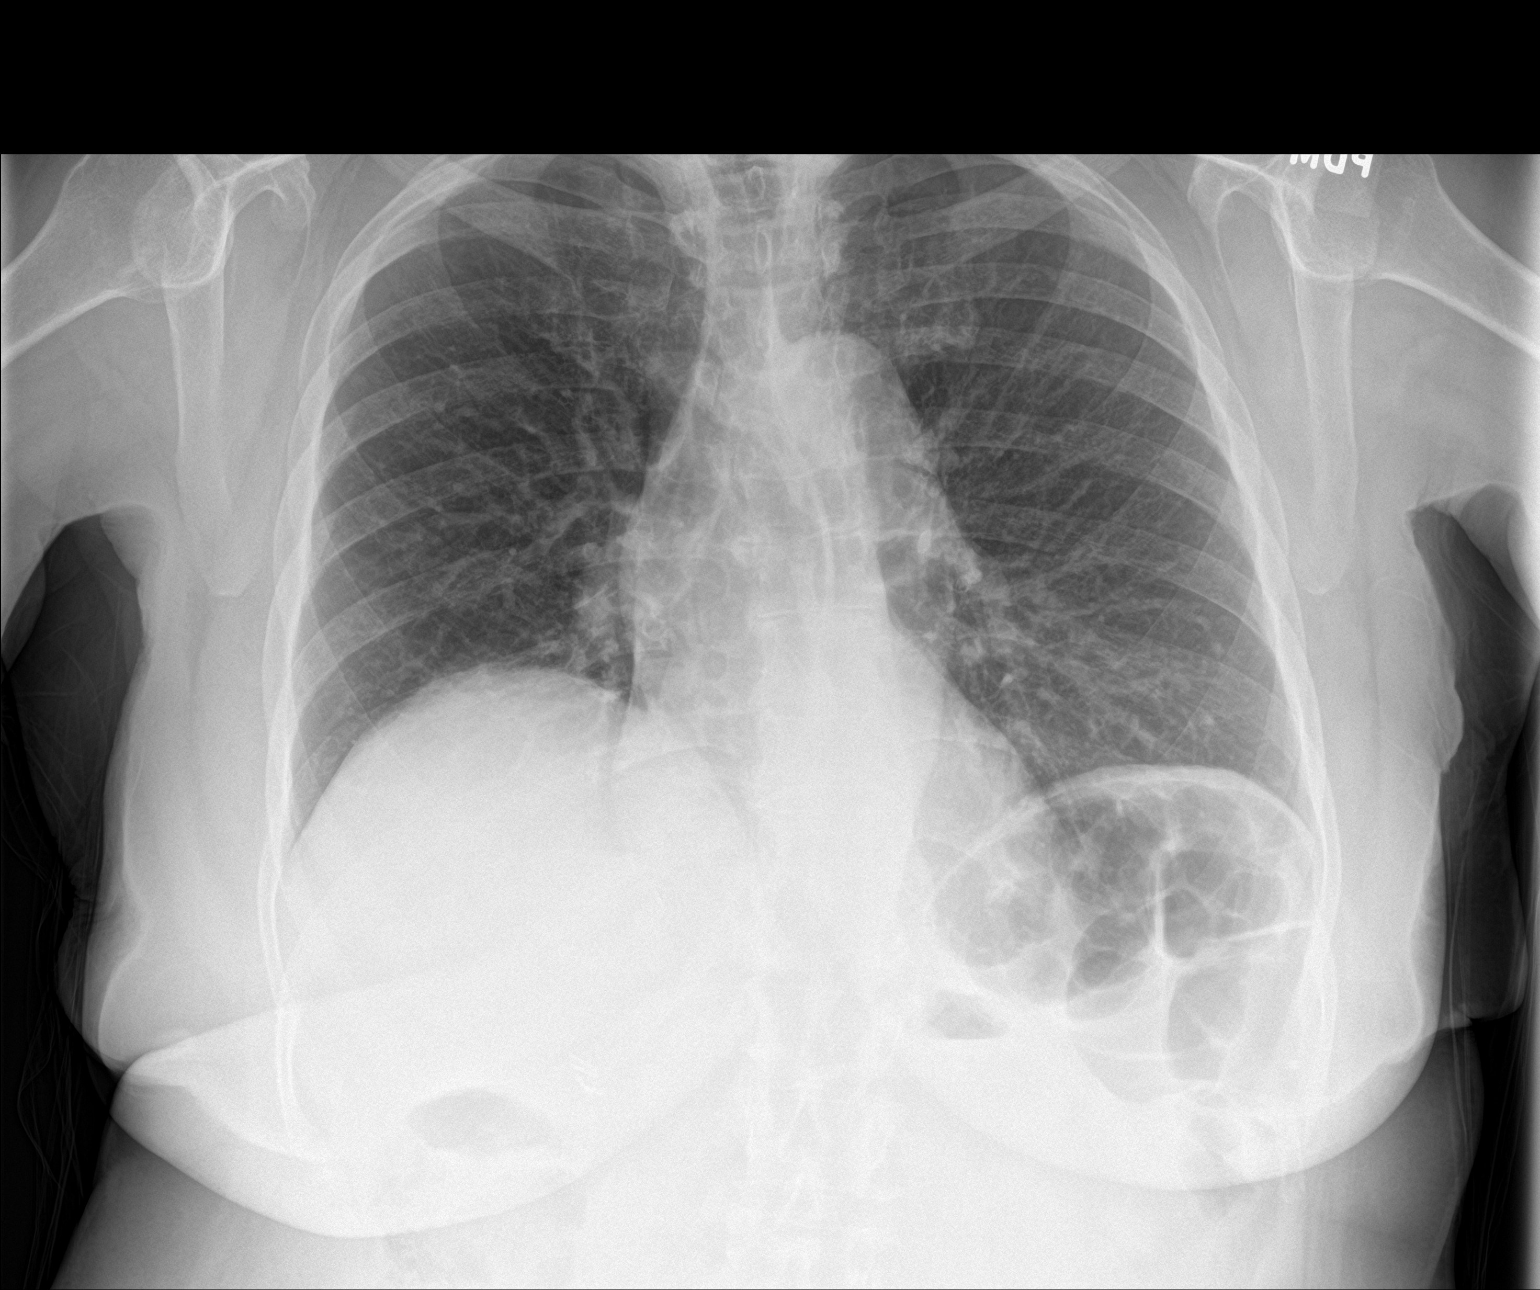

[chest lat]
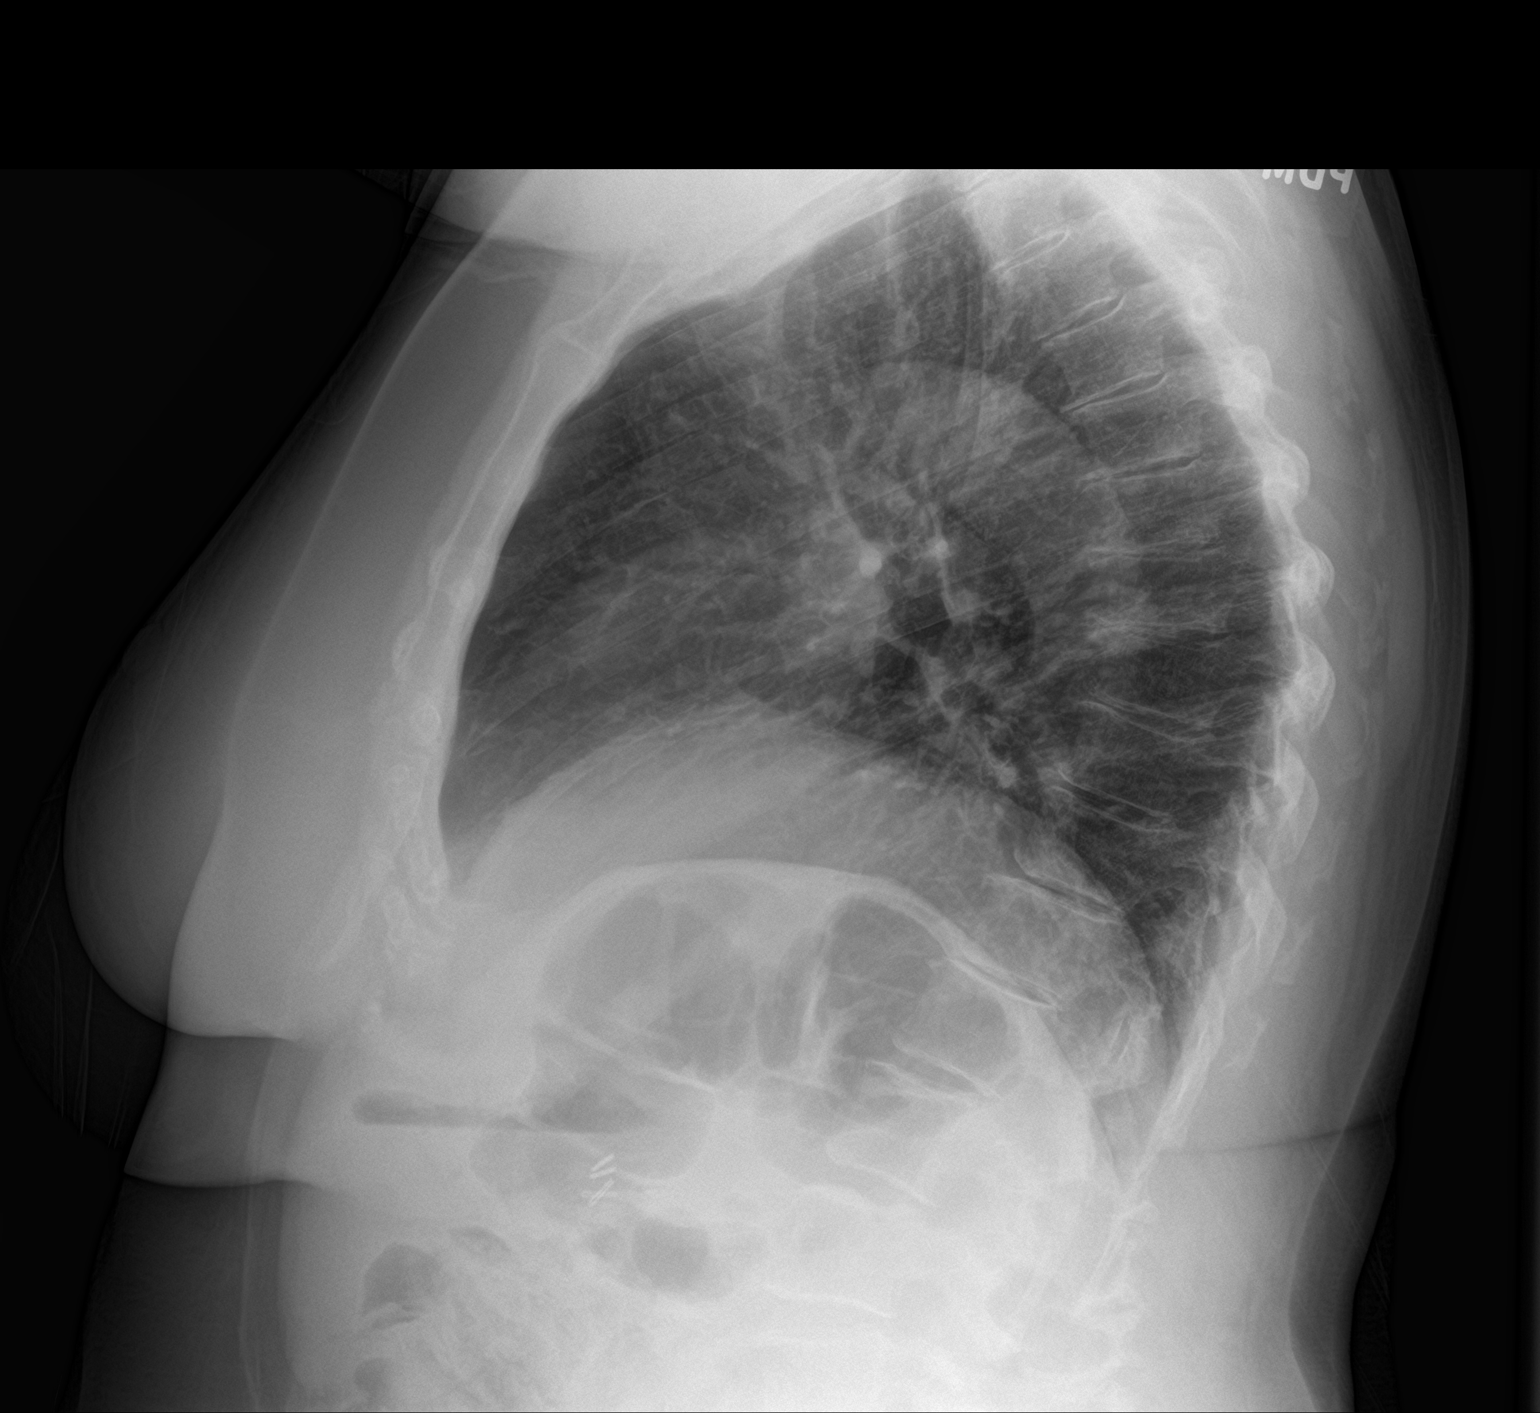

[2 of 2 positions shown; findings below may reference images not displayed]

FINDINGS: Lungs are well expanded, symmetric, and clear. No pneumothorax or
pleural effusion. Cardiac size within normal limits. Pulmonary
vascularity is normal. Osseous structures are age-appropriate. No
acute bone abnormality.
IMPRESSION: No active cardiopulmonary disease.

## 2023-05-17 DIAGNOSIS — M1712 Unilateral primary osteoarthritis, left knee: Secondary | ICD-10-CM | POA: Diagnosis not present

## 2023-05-25 DIAGNOSIS — M1712 Unilateral primary osteoarthritis, left knee: Secondary | ICD-10-CM | POA: Diagnosis not present

## 2023-05-31 DIAGNOSIS — M1712 Unilateral primary osteoarthritis, left knee: Secondary | ICD-10-CM | POA: Diagnosis not present

## 2023-06-07 DIAGNOSIS — M1712 Unilateral primary osteoarthritis, left knee: Secondary | ICD-10-CM | POA: Diagnosis not present

## 2023-06-08 DIAGNOSIS — H04123 Dry eye syndrome of bilateral lacrimal glands: Secondary | ICD-10-CM | POA: Diagnosis not present

## 2023-06-08 DIAGNOSIS — H16223 Keratoconjunctivitis sicca, not specified as Sjogren's, bilateral: Secondary | ICD-10-CM | POA: Diagnosis not present

## 2023-06-08 DIAGNOSIS — H40013 Open angle with borderline findings, low risk, bilateral: Secondary | ICD-10-CM | POA: Diagnosis not present

## 2023-06-08 DIAGNOSIS — H524 Presbyopia: Secondary | ICD-10-CM | POA: Diagnosis not present

## 2023-06-08 DIAGNOSIS — H35373 Puckering of macula, bilateral: Secondary | ICD-10-CM | POA: Diagnosis not present

## 2023-06-28 ENCOUNTER — Encounter: Payer: Self-pay | Admitting: Internal Medicine

## 2023-06-29 ENCOUNTER — Other Ambulatory Visit (INDEPENDENT_AMBULATORY_CARE_PROVIDER_SITE_OTHER): Payer: Medicare Other

## 2023-06-29 DIAGNOSIS — I10 Essential (primary) hypertension: Secondary | ICD-10-CM

## 2023-06-29 LAB — BASIC METABOLIC PANEL
BUN: 14 mg/dL (ref 6–23)
CO2: 28 mEq/L (ref 19–32)
Calcium: 9.5 mg/dL (ref 8.4–10.5)
Chloride: 104 mEq/L (ref 96–112)
Creatinine, Ser: 0.79 mg/dL (ref 0.40–1.20)
GFR: 75.22 mL/min (ref >60.00)
Glucose, Bld: 96 mg/dL (ref 70–99)
Potassium: 3.9 mEq/L (ref 3.5–5.1)
Sodium: 139 mEq/L (ref 135–145)

## 2023-07-07 ENCOUNTER — Other Ambulatory Visit: Payer: Self-pay | Admitting: Internal Medicine

## 2023-07-11 ENCOUNTER — Telehealth: Payer: Self-pay

## 2023-07-11 NOTE — Telephone Encounter (Signed)
Patient is requesting a refill on her zofran 4mg  tablets. Hers are outdated. Uses [facility] pharmacy on [address]. Had some nausea/vomiting last night. Please advise Sir. She is aware Dr Leone Payor is out today.

## 2023-07-12 MED ORDER — ONDANSETRON HCL 4 MG PO TABS: ORAL_TABLET | ORAL | 1 refills | Status: AC

## 2023-07-12 NOTE — Telephone Encounter (Signed)
Rx sent 

## 2023-07-12 NOTE — Telephone Encounter (Signed)
Patient informed. 

## 2023-07-13 ENCOUNTER — Encounter: Payer: Self-pay | Admitting: Internal Medicine

## 2023-07-13 ENCOUNTER — Ambulatory Visit (INDEPENDENT_AMBULATORY_CARE_PROVIDER_SITE_OTHER): Payer: Medicare Other | Admitting: Internal Medicine

## 2023-07-13 VITALS — BP 160/80 | HR 43 | Temp 98.3°F | Ht 61.0 in | Wt 142.0 lb

## 2023-07-13 DIAGNOSIS — J3089 Other allergic rhinitis: Secondary | ICD-10-CM | POA: Diagnosis not present

## 2023-07-13 MED ORDER — DOXYCYCLINE HYCLATE 100 MG PO TABS: 100.0000 mg | ORAL_TABLET | Freq: Two times a day (BID) | ORAL | 0 refills | Status: DC

## 2023-07-13 NOTE — Patient Instructions (Signed)
We have sent in doxycycline to take 1 pill twice a day for 1 week.  

## 2023-07-13 NOTE — Progress Notes (Signed)
   Subjective:   Patient ID: Gina Jefferson, female    DOB: 1951-07-05, 72 y.o.   MRN: [id number]  Sinusitis Associated symptoms include congestion, headaches and sinus pressure. Pertinent negatives include no coughing or shortness of breath.   The patient is a 72YO female coming in for chronic sinus issues. Not really worse than usual lately. Worse left side and poor airflow. Wants to see ENT to consider if there is a procedural option.   Review of Systems  Constitutional: Negative.   HENT:  Positive for congestion and sinus pressure.        Throat clearing  Eyes: Negative.   Respiratory:  Negative for cough, chest tightness and shortness of breath.   Cardiovascular:  Negative for chest pain, palpitations and leg swelling.  Gastrointestinal:  Negative for abdominal distention, abdominal pain, constipation, diarrhea, nausea and vomiting.  Musculoskeletal: Negative.   Skin: Negative.   Neurological:  Positive for headaches.  Psychiatric/Behavioral: Negative.      Objective:  Physical Exam Constitutional:      Appearance: She is well-developed.  HENT:     Head: Normocephalic and atraumatic.     Nose: Rhinorrhea present.  Cardiovascular:     Rate and Rhythm: Normal rate and regular rhythm.  Pulmonary:     Effort: Pulmonary effort is normal. No respiratory distress.     Breath sounds: Normal breath sounds. No wheezing or rales.  Abdominal:     General: Bowel sounds are normal. There is no distension.     Palpations: Abdomen is soft.     Tenderness: There is no abdominal tenderness. There is no rebound.  Musculoskeletal:     Cervical back: Normal range of motion.  Skin:    General: Skin is warm and dry.  Neurological:     Mental Status: She is alert and oriented to person, place, and time.     Coordination: Coordination normal.     Vitals:   07/13/23 0859 07/13/23 0901  BP: (!) 160/80 (!) 160/80  Pulse: (!) 43   Temp: 98.3 F (36.8 C)   TempSrc: Oral   SpO2: 97%    Weight: 142 lb (64.4 kg)   Height: 5\' 1"  (1.549 m)     Assessment & Plan:

## 2023-07-13 NOTE — Assessment & Plan Note (Signed)
Consistent symptoms for years. More left sided and mouth breathing at night due to restriction. She is taking zyrtec and flonase regularly without relief. Refer to ENT and rx doxycycline 1 week course to see if she has some chronic sinus infection.

## 2023-07-29 ENCOUNTER — Other Ambulatory Visit: Payer: Self-pay | Admitting: Internal Medicine

## 2023-08-23 ENCOUNTER — Telehealth: Payer: Self-pay

## 2023-08-23 MED ORDER — DICYCLOMINE HCL 20 MG PO TABS: ORAL_TABLET | ORAL | 1 refills | Status: AC

## 2023-08-23 NOTE — Telephone Encounter (Signed)
Patients dicyclomine refilled as requested. Confirmed pharmacy.

## 2023-08-24 ENCOUNTER — Ambulatory Visit (INDEPENDENT_AMBULATORY_CARE_PROVIDER_SITE_OTHER): Payer: Medicare Other | Admitting: Otolaryngology

## 2023-08-24 ENCOUNTER — Encounter (INDEPENDENT_AMBULATORY_CARE_PROVIDER_SITE_OTHER): Payer: Self-pay | Admitting: Otolaryngology

## 2023-08-24 VITALS — BP 175/88 | HR 86 | Ht 61.0 in | Wt 147.0 lb

## 2023-08-24 DIAGNOSIS — J343 Hypertrophy of nasal turbinates: Secondary | ICD-10-CM | POA: Diagnosis not present

## 2023-08-24 DIAGNOSIS — J3489 Other specified disorders of nose and nasal sinuses: Secondary | ICD-10-CM | POA: Diagnosis not present

## 2023-08-24 DIAGNOSIS — J342 Deviated nasal septum: Secondary | ICD-10-CM | POA: Diagnosis not present

## 2023-08-24 DIAGNOSIS — R448 Other symptoms and signs involving general sensations and perceptions: Secondary | ICD-10-CM | POA: Diagnosis not present

## 2023-08-24 DIAGNOSIS — R0981 Nasal congestion: Secondary | ICD-10-CM

## 2023-08-24 DIAGNOSIS — J3089 Other allergic rhinitis: Secondary | ICD-10-CM | POA: Diagnosis not present

## 2023-08-24 DIAGNOSIS — R519 Headache, unspecified: Secondary | ICD-10-CM | POA: Diagnosis not present

## 2023-08-24 DIAGNOSIS — D140 Benign neoplasm of middle ear, nasal cavity and accessory sinuses: Secondary | ICD-10-CM

## 2023-08-24 DIAGNOSIS — J329 Chronic sinusitis, unspecified: Secondary | ICD-10-CM | POA: Diagnosis not present

## 2023-08-24 DIAGNOSIS — R0982 Postnasal drip: Secondary | ICD-10-CM | POA: Diagnosis not present

## 2023-08-24 MED ORDER — SALINE SPRAY 0.65 % NA SOLN: 1.0000 | NASAL | 5 refills | Status: DC | PRN

## 2023-08-24 MED ORDER — DESLORATADINE 5 MG PO TABS: 5.0000 mg | ORAL_TABLET | Freq: Every day | ORAL | 3 refills | Status: DC

## 2023-08-24 MED ORDER — FLUTICASONE PROPIONATE 50 MCG/ACT NA SUSP: 2.0000 | Freq: Every day | NASAL | 6 refills | Status: DC

## 2023-08-24 NOTE — Patient Instructions (Signed)
-   schedule CT sinuses - start nasal rinses  - stop Zyrtec and start Clarinex for allergies - continue Flonase twice daily - try [employer] Med Nasal Saline Rinse   - start nasal saline rinses with [employer] available over the counter or online to help with nasal congestion

## 2023-08-24 NOTE — Progress Notes (Signed)
ENT CONSULT:  Reason for Consult: chronic sinusitis and facial pain/headache   HPI: Gina Jefferson is an 72 y.o. female with hx of GERD on Prevacid daily, history of environmental allergies on Zyrtec and Flonase, here for chronic versus recurrent sinusitis.  She reports at least three different courses of antibiotics this year for sinus infections. She more recently received Doxycycline 1 mo ago, and she had some improvement while on it, but after she finished sx came back.  Never had oral steroids for her symptoms.  She reports sx of facial pain/pressure frontal headache, post-nasal drainage, bad coughing spits with clear mucus. She had blood tinged drainage, which she coughed up after having post-nasal drainage the other day. She is not sure if it was a nose bleed. She denies recurrent epistaxis. She never had sinus or nasal surgery. No allergy testing before. She takes Zyrtec daily. She is on Flonase as needed. No hx of asthma. Former smoker quit several years ago. She has hx of GERD, takes Prevacid daily at night.     Records Reviewed:  Office visit with Dr Okey Dupre 08/24/23  Sinusitis Associated symptoms include congestion, headaches and sinus pressure. Pertinent negatives include no coughing or shortness of breath.    The patient is a 72YO female coming in for chronic sinus issues. Not really worse than usual lately. Worse left side and poor airflow. Wants to see ENT to consider if there is a procedural option.    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 x 15 yrs.   Seasonal allergies     Past Surgical History:  Procedure Laterality Date   ABDOMINAL HYSTERECTOMY     complete   APPENDECTOMY     BREAST REDUCTION SURGERY  2016   CARPAL  TUNNEL RELEASE  11/24/2011   Procedure: CARPAL TUNNEL RELEASE;  Surgeon: Valeria Batman, MD;  Location: [facility];  Service: Orthopedics;  Laterality: Right;  CARPAL TUNNEL REL ON RIGHT    CATARACT EXTRACTION, BILATERAL Bilateral 2018   CESAREAN SECTION     CHOLECYSTECTOMY     COLONOSCOPY     OOPHORECTOMY     OVARIAN CYST REMOVAL     POLYPECTOMY     TONSILLECTOMY     WISDOM TOOTH EXTRACTION      Family History  Problem Relation Age of Onset   Diabetes Mother    Stroke Mother    Heart disease Mother    Hyperlipidemia Mother    Hypertension Mother    Stroke Father    Alzheimer's disease Father    Mental illness Father    Hypertension Father    Hyperlipidemia Father    Cancer Maternal Grandfather        stomach cancer   Colon cancer Neg Hx     Social History:  reports that she quit smoking about 19 years ago. Her smoking use included cigarettes. She has never used smokeless tobacco. She reports that she does not drink alcohol and does not use drugs.  Allergies:  Allergies  Allergen Reactions   Clindamycin/Lincomycin Rash   Ciprofloxacin Rash   Penicillins Rash   Statins Other (See Comments)    Muscle  aches   Metformin Diarrhea and Other (See Comments)    Vomiting     Medications: I have reviewed the patient's current medications.  The PMH, PSH, Medications, Allergies, and SH were reviewed and updated.  ROS: Constitutional: Negative for fever, weight loss and weight gain. Cardiovascular: Negative for chest pain and dyspnea on exertion. Respiratory: Is not experiencing shortness of breath at rest. Gastrointestinal: Negative for nausea and vomiting. Neurological: Negative for headaches. Psychiatric: The patient is not nervous/anxious  Blood pressure (!) 175/88, pulse 86, height 5\' 1"  (1.549 m), weight 147 lb (66.7 kg), SpO2 95%.  PHYSICAL EXAM:  Exam: General: Well-developed, well-nourished Respiratory Respiratory effort: Equal inspiration  and expiration without stridor Cardiovascular Peripheral Vascular: Warm extremities with equal color/perfusion Eyes: No nystagmus with equal extraocular motion bilaterally Neuro/Psych/Balance: Patient oriented to person, place, and time; Appropriate mood and affect; Gait is intact with no imbalance; Cranial nerves I-XII are intact Head and Face Inspection: Normocephalic and atraumatic without mass or lesion Palpation: Facial skeleton intact without bony stepoffs Salivary Glands: No mass or tenderness Facial Strength: Facial motility symmetric and full bilaterally ENT Pinna: External ear intact and fully developed External canal: Canal is patent with intact skin Tympanic Membrane: Clear and mobile External Nose: No scar or anatomic deformity Internal Nose: Septum is deviated to the left. No polyp, or purulence. Mucosal edema and erythema present.  Bilateral inferior turbinate hypertrophy.  Lips, Teeth, and gums: Mucosa and teeth intact and viable TMJ: No pain to palpation with full mobility Oral cavity/oropharynx: No erythema or exudate, no lesions present Nasopharynx: No mass or lesion with intact mucosa Neck Neck and Trachea: Midline trachea without mass or lesion Thyroid: No mass or nodularity Lymphatics: No lymphadenopathy  Procedure:   PROCEDURE NOTE: nasal endoscopy  Preoperative diagnosis: chronic sinusitis symptoms  Postoperative diagnosis: same  Procedure: Diagnostic nasal endoscopy (16109)  Surgeon: Ashok Croon, M.D.  Anesthesia: Topical lidocaine and Afrin  H&P REVIEW: The patient's history and physical were reviewed today prior to procedure. All medications were reviewed and updated as well. Complications: None Condition is stable throughout exam Indications and consent: The patient presents with symptoms of chronic sinusitis not responding to previous therapies. All the risks, benefits, and potential complications were reviewed with the patient  preoperatively and informed consent was obtained. The time out was completed with confirmation of the correct procedure.   Procedure: The patient was seated upright in the clinic. Topical lidocaine and Afrin were applied to the nasal cavity. After adequate anesthesia had occurred, the rigid nasal endoscope was passed into the nasal cavity. The nasal mucosa, turbinates, septum, and sinus drainage pathways were visualized bilaterally. This revealed no purulence or significant secretions that might be cultured. There were no polyps or sites of significant inflammation. The mucosa was intact and there was no crusting present. The scope was then slowly withdrawn and the patient tolerated the procedure well. There were no complications or blood loss.       Studies Reviewed:CXR 02/02/23 - negative    Assessment/Plan: Encounter Diagnoses  Name Primary?   Chronic sinusitis, unspecified location Yes   Rhinorrhea    Nonintractable headache, unspecified chronicity pattern, unspecified headache type    Nasal congestion    Environmental and seasonal allergies    Deviated nasal septum    Benign neoplasm of inferior turbinate    Facial pressure     1.Chronic versus recurrent sinusitis - requiring multiple courses of antibiotics with residual symptoms of facial pain pressure frontal headaches nasal congestion postnasal  drainage, no prior imaging of the sinuses - schedule CT sinuses to evaluate for chronic sinus inflammation - start nasal rinses   2.  Nasal congestion postnasal drainage environmental allergies - stop Zyrtec and start Clarinex 5 mg daily for allergies - continue Flonase twice daily 2 puffs each naris  3.  Nasal obstruction deviated nasal septum inferior turban hypertrophy -Medical management as above, will consider other interventions such as surgery in the future if fails maximal medical management  Thank you for allowing me to participate in the care of this patient. Please do not  hesitate to contact me with any questions or concerns.   Ashok Croon, MD Otolaryngology [facility] Phone: [phone] Fax: [phone]    08/24/2023, 3:55 PM

## 2023-08-31 ENCOUNTER — Ambulatory Visit: Payer: Medicare Other

## 2023-08-31 VITALS — Ht 61.0 in | Wt 147.0 lb

## 2023-08-31 DIAGNOSIS — Z0000 Encounter for general adult medical examination without abnormal findings: Secondary | ICD-10-CM

## 2023-08-31 NOTE — Progress Notes (Signed)
Subjective:   Gina Jefferson is a 72 y.o. female who presents for Medicare Annual (Subsequent) preventive examination.  Visit Complete: Virtual  I connected with  Gina Jefferson on 08/31/23 by a audio enabled telemedicine application and verified that I am speaking with the correct person using two identifiers.  Patient Location: Home  Provider Location: Home Office  I discussed the limitations of evaluation and management by telemedicine. The patient expressed understanding and agreed to proceed.  Patient Medicare AWV questionnaire was completed by the patient on 08/27/23; I have confirmed that all information answered by patient is correct and no changes since this date.  Cardiac Risk Factors include: advanced age (>13men, >60 women);hypertension Because this visit was a virtual/telehealth visit, some criteria may be missing or patient reported. Any vitals not documented were not able to be obtained and vitals that have been documented are patient reported.      Objective:    Today's Vitals   08/31/23 1143  Weight: 147 lb (66.7 kg)  Height: 5\' 1"  (1.549 m)   Body mass index is 27.78 kg/m.     08/31/2023   11:53 AM 09/21/2022   10:35 AM 02/23/2018    9:56 AM 11/17/2011    3:58 PM  Advanced Directives  Does Patient Have a Medical Advance Directive? Yes Yes Yes Patient has advance directive, copy not in chart  Type of Advance Directive Healthcare Power of [employer];Living will Healthcare Power of [place];Living will Healthcare Power of [place];Living will Living will;Healthcare Power of Attorney  Copy of Healthcare Power of Attorney in Chart? No - copy requested No - copy requested No - copy requested     Current Medications (verified) Outpatient Encounter Medications as of 08/31/2023  Medication Sig   albuterol (VENTOLIN HFA) 108 (90 Base) MCG/ACT inhaler Inhale 2 puffs into the lungs every 6 (six) hours as needed for wheezing or shortness of breath.   ALPRAZolam (XANAX)  0.25 MG tablet Take 1 tablet (0.25 mg total) by mouth daily as needed for anxiety.   Ascorbic Acid (VITAMIN C) 500 MG CAPS Take 500 mg by mouth daily.   Biotin 1000 MCG tablet Take 1,000 mcg by mouth daily.   cetirizine (ZYRTEC) 10 MG tablet Take 10 mg by mouth daily.   Cholecalciferol (VITAMIN D PO) Take 5,000 Units by mouth daily.   desloratadine (CLARINEX) 5 MG tablet Take 1 tablet (5 mg total) by mouth daily.   diclofenac (VOLTAREN) 50 MG EC tablet Take 50 mg by mouth daily as needed.   Diclofenac Sodium 3 % GEL Reported on 05/05/2016   dicyclomine (BENTYL) 20 MG tablet TAKE 1 TABLET(20 MG) BY MOUTH TWICE DAILY AS NEEDED FOR SPASMS OR ABDOMINAL PAIN   fluticasone (FLONASE) 50 MCG/ACT nasal spray Place 2 sprays into both nostrils daily.   Garlic 1000 MG CAPS Take by mouth daily. (Patient not taking: Reported on 08/24/2023)   lansoprazole (PREVACID) 30 MG capsule TAKE 1 CAPSULE BY MOUTH AT  BEDTIME   meclizine (ANTIVERT) 12.5 MG tablet Take 1 tablet (12.5 mg total) by mouth 3 (three) times daily as needed for dizziness.   ondansetron (ZOFRAN) 4 MG tablet 1 tablet by mouth every 6 to 8 hours as needed for nausea   sodium chloride (OCEAN) 0.65 % SOLN nasal spray Place 1 spray into both nostrils as needed.   traZODone (DESYREL) 50 MG tablet TAKE 1/2 TO 1 TABLET(25 TO 50 MG) BY MOUTH AT BEDTIME AS NEEDED FOR SLEEP   valsartan (DIOVAN) 320 MG  tablet TAKE 1 TABLET(320 MG) BY MOUTH DAILY   [DISCONTINUED] benzonatate (TESSALON) 200 MG capsule Take 1 capsule (200 mg total) by mouth 3 (three) times daily as needed.   [DISCONTINUED] doxycycline (VIBRA-TABS) 100 MG tablet Take 1 tablet (100 mg total) by mouth 2 (two) times daily.   No facility-administered encounter medications on file as of 08/31/2023.    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 x 15 yrs.   Seasonal allergies    Past Surgical History:  Procedure Laterality Date   ABDOMINAL HYSTERECTOMY     complete   APPENDECTOMY     BREAST REDUCTION SURGERY  2016   CARPAL TUNNEL RELEASE  11/24/2011   Procedure: CARPAL TUNNEL RELEASE;  Surgeon: Valeria Batman, MD;  Location: [facility];  Service: Orthopedics;  Laterality: Right;  CARPAL TUNNEL REL ON RIGHT    CATARACT EXTRACTION, BILATERAL Bilateral 2018   CESAREAN SECTION     CHOLECYSTECTOMY     COLONOSCOPY     OOPHORECTOMY     OVARIAN CYST REMOVAL     POLYPECTOMY     TONSILLECTOMY     WISDOM TOOTH EXTRACTION     Family History  Problem Relation Age of Onset   Diabetes Mother    Stroke Mother    Heart disease Mother    Hyperlipidemia Mother    Hypertension Mother    Stroke Father    Alzheimer's disease Father    Mental illness Father    Hypertension Father    Hyperlipidemia Father    Cancer Maternal Grandfather        stomach cancer   Colon cancer Neg Hx    Social History   Socioeconomic History   Marital status: Married    Spouse name: Not on file   Number of children: Not on file   Years of education: Not on file   Highest education level: Associate degree: occupational, Scientist, product/process development, or vocational program  Occupational History   Not on file  Tobacco Use   Smoking status: Former    Current packs/day: 0.00    Types: Cigarettes    Quit date: 11/29/2003    Years since quitting: 19.7   Smokeless tobacco: Never  Vaping Use   Vaping status: Never Used  Substance and Sexual Activity   Alcohol use: No    Alcohol/week: 0.0 standard drinks of alcohol    Comment: occasionally   Drug use: No   Sexual activity: Not on file  Other Topics Concern   Not on file  Social History Narrative   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   Lives with husband.  She has two adult children.    Social Determinants of Health   Financial Resource Strain: Low Risk  (08/27/2023)   Overall Financial Resource Strain (CARDIA)    Difficulty of Paying Living Expenses: Not hard at all  Food Insecurity: No Food Insecurity (08/27/2023)   Hunger Vital Sign    Worried About Running Out of Food in the Last Year: Never true    Ran Out of Food in the Last Year: Never  true  Transportation Needs: No Transportation Needs (08/27/2023)   PRAPARE - Administrator, Civil Service (Medical): No    Lack of Transportation (Non-Medical): No  Physical Activity: Insufficiently Active (08/27/2023)   Exercise Vital Sign    Days of Exercise per Week: 3 days    Minutes of Exercise per Session: 20 min  Stress: No Stress Concern Present (08/27/2023)   [employer] of Occupational Health - Occupational Stress Questionnaire    Feeling of Stress : Only a little  Social Connections: Socially Integrated (08/27/2023)   Social Connection and Isolation Panel [NHANES]    Frequency of Communication with Friends and Family: More than three times a week    Frequency of Social Gatherings with Friends and Family: More than three times a week    Attends Religious Services: 1 to 4 times per year    Active Member of [employer] or Organizations: Yes    Attends Engineer, structural: More than 4 times per year    Marital Status: Married    Tobacco Counseling Counseling given: Not Answered   Clinical Intake:  Pre-visit preparation completed: Yes  Pain : No/denies pain     BMI - recorded: 27.78 Nutritional Status: BMI 25 -29 Overweight Nutritional Risks: None Diabetes: No  How often do you need to have someone help you when you read instructions, pamphlets, or other written materials from your doctor or pharmacy?: 1 - Never  Interpreter Needed?: No  Information entered by :: Theresa Mulligan LPN   Activities of Daily  Living    08/27/2023    2:27 PM 08/21/2023    3:21 PM  In your present state of health, do you have any difficulty performing the following activities:  Hearing? 0 0  Vision? 0 0  Difficulty concentrating or making decisions? 0 0  Walking or climbing stairs? 0 0  Dressing or bathing? 0 0  Doing errands, shopping? 0 0  Preparing Food and eating ? N N  Using the Toilet? N N  In the past six months, have you accidently leaked urine? N Y  Do you have problems with loss of bowel control? N N  Managing your Medications? N N  Managing your Finances? N N  Housekeeping or managing your Housekeeping? N N    Patient Care Team: Myrlene Broker, MD as PCP - General (Internal Medicine) Iva Boop, MD as Consulting Physician (Gastroenterology) Marcelle Overlie, MD as Consulting Physician (Obstetrics and Gynecology) Lunette Stands, MD as Consulting Physician (Orthopedic Surgery) Dorisann Frames, MD (Endocrinology) Kathyrn Sheriff, [facility] (Inactive) as Pharmacist (Pharmacist)  Indicate any recent Medical Services you may have received from other than [facility] providers in the past year (date may be approximate).     Assessment:   This is a routine wellness examination for Gina Jefferson.  Hearing/Vision screen Hearing Screening - Comments:: Denies hearing difficulties   Vision Screening - Comments:: Wears rx glasses - up to date with routine eye exams with Dr Elmer Picker    Goals Addressed               This Visit's Progress     Stay Active (pt-stated)         Depression Screen    08/31/2023   11:48 AM 07/13/2023    9:01 AM 05/01/2023   10:48 AM 02/02/2023   10:49 AM 09/21/2022   10:36 AM 09/13/2021    2:10 PM 09/13/2021    2:01 PM  PHQ 2/9 Scores  PHQ - 2  Score 0 1 0 0 0 0   PHQ- 9 Score 0 3  0     Exception Documentation       Other- indicate reason in comment box  Not completed       pt stated that she has been going some personal issues.    Fall Risk    08/31/2023   11:52 AM  08/27/2023    2:27 PM 08/21/2023    3:21 PM 07/13/2023    9:01 AM 05/01/2023   10:47 AM  Fall Risk   Falls in the past year? 0 0 0 0 0  Number falls in past yr: 0 0 0 0 0  Injury with Fall? 0 0 0 0 0  Risk for fall due to : No Fall Risks No Fall Risks     Follow up Falls prevention discussed Falls prevention discussed  Falls evaluation completed Falls evaluation completed    MEDICARE RISK AT HOME: Medicare Risk at Home Any stairs in or around the home?: Yes If so, are there any without handrails?: No Home free of loose throw rugs in walkways, pet beds, electrical cords, etc?: Yes Adequate lighting in your home to reduce risk of falls?: Yes Life alert?: No Use of a cane, walker or w/c?: No Grab bars in the bathroom?: Yes Shower chair or bench in shower?: No Elevated toilet seat or a handicapped toilet?: Yes  TIMED UP AND GO:  Was the test performed?  No    Cognitive Function:        08/31/2023   11:53 AM 09/21/2022   10:39 AM  6CIT Screen  What Year? 0 points 0 points  What month? 0 points 0 points  What time? 0 points 0 points  Count back from 20 0 points 0 points  Months in reverse 0 points 0 points  Repeat phrase 0 points 0 points  Total Score 0 points 0 points    Immunizations Immunization History  Administered Date(s) Administered   Fluad Quad(high Dose 65+) 08/21/2019, 08/27/2020, 08/31/2021, 09/11/2022   Influenza Split 08/28/2012   Influenza Whole 08/08/2005, 08/19/2008, 09/28/2009, 09/28/2010   Influenza,inj,Quad PF,6+ Mos 08/28/2013, 09/11/2014, 09/06/2016   Influenza-Unspecified 08/12/2013, 09/12/2015, 09/07/2017, 09/11/2018, 04/09/2020, 08/31/2021, 09/11/2022   PFIZER(Purple Top)SARS-COV-2 Vaccination 12/07/2019, 12/26/2019, 09/18/2020   Pfizer Covid-19 Vaccine Bivalent Booster 33yrs & up 08/19/2021   Pneumococcal Conjugate-13 02/21/2017   Pneumococcal Polysaccharide-23 01/16/2006, 02/23/2018   Pneumococcal-Unspecified 02/21/2017   Td 01/17/2005   Tdap  02/27/2015   Unspecified SARS-COV-2 Vaccination 08/19/2021   Zoster, Live 01/15/2013    TDAP status: Up to date  Flu Vaccine status: Due, Education has been provided regarding the importance of this vaccine. Advised may receive this vaccine at local pharmacy or Health Dept. Aware to provide a copy of the vaccination record if obtained from local pharmacy or Health Dept. Verbalized acceptance and understanding.  Pneumococcal vaccine status: Up to date  Covid-19 vaccine status: Declined, Education has been provided regarding the importance of this vaccine but patient still declined. Advised may receive this vaccine at local pharmacy or Health Dept.or vaccine clinic. Aware to provide a copy of the vaccination record if obtained from local pharmacy or Health Dept. Verbalized acceptance and understanding.  Qualifies for Shingles Vaccine? Yes   Zostavax completed No   Shingrix Completed?: No.    Education has been provided regarding the importance of this vaccine. Patient has been advised to call insurance company to determine out of pocket expense if they have not yet received this  vaccine. Advised may also receive vaccine at local pharmacy or Health Dept. Verbalized acceptance and understanding.  Screening Tests Health Maintenance  Topic Date Due   Zoster Vaccines- Shingrix (1 of 2) 01/01/2002   INFLUENZA VACCINE  06/29/2023   COVID-19 Vaccine (6 - 2023-24 season) 07/30/2023   MAMMOGRAM  12/24/2023   Medicare Annual Wellness (AWV)  08/30/2024   DTaP/Tdap/Td (3 - Td or Tdap) 02/26/2025   Colonoscopy  07/19/2025   Pneumonia Vaccine 97+ Years old  Completed   DEXA SCAN  Completed   Hepatitis C Screening  Completed   HPV VACCINES  Aged Out    Health Maintenance  Health Maintenance Due  Topic Date Due   Zoster Vaccines- Shingrix (1 of 2) 01/01/2002   INFLUENZA VACCINE  06/29/2023   COVID-19 Vaccine (6 - 2023-24 season) 07/30/2023    Colorectal cancer screening: Type of screening:  Colonoscopy. Completed 07/20/15. Repeat every 10 years  Mammogram status: Completed 12/23/21. Repeat every year  Bone Density status: Completed 11/26/20. Results reflect: Bone density results: NORMAL. Repeat every   years.    Additional Screening:  Hepatitis C Screening: does qualify; Completed 02/18/16  Vision Screening: Recommended annual ophthalmology exams for early detection of glaucoma and other disorders of the eye. Is the patient up to date with their annual eye exam?  Yes  Who is the provider or what is the name of the office in which the patient attends annual eye exams? Dr Elmer Picker  If pt is not established with a provider, would they like to be referred to a provider to establish care? No .   Dental Screening: Recommended annual dental exams for proper oral hygiene   Community Resource Referral / Chronic Care Management:  CRR required this visit?  No   CCM required this visit?  No     Plan:     I have personally reviewed and noted the following in the patient's chart:   Medical and social history Use of alcohol, tobacco or illicit drugs  Current medications and supplements including opioid prescriptions. Patient is not currently taking opioid prescriptions. Functional ability and status Nutritional status Physical activity Advanced directives List of other physicians Hospitalizations, surgeries, and ER visits in previous 12 months Vitals Screenings to include cognitive, depression, and falls Referrals and appointments  In addition, I have reviewed and discussed with patient certain preventive protocols, quality metrics, and best practice recommendations. A written personalized care plan for preventive services as well as general preventive health recommendations were provided to patient.     Tillie Rung, LPN   [id number]   After Visit Summary: (MyChart) Due to this being a telephonic visit, the after visit summary with patients personalized plan was  offered to patient via MyChart   Nurse Notes: None

## 2023-08-31 NOTE — Patient Instructions (Addendum)
Gina Jefferson , Thank you for taking time to come for your Medicare Wellness Visit. I appreciate your ongoing commitment to your health goals. Please review the following plan we discussed and let me know if I can assist you in the future.   Referrals/Orders/Follow-Ups/Clinician Recommendations:   This is a list of the screening recommended for you and due dates:  Health Maintenance  Topic Date Due   Zoster (Shingles) Vaccine (1 of 2) 01/01/2002   Flu Shot  06/29/2023   COVID-19 Vaccine (6 - 2023-24 season) 07/30/2023   Mammogram  12/24/2023   Medicare Annual Wellness Visit  08/30/2024   DTaP/Tdap/Td vaccine (3 - Td or Tdap) 02/26/2025   Colon Cancer Screening  07/19/2025   Pneumonia Vaccine  Completed   DEXA scan (bone density measurement)  Completed   Hepatitis C Screening  Completed   HPV Vaccine  Aged Out    Advanced directives: (Copy Requested) Please bring a copy of your health care power of attorney and living will to the office to be added to your chart at your convenience.  Next Medicare Annual Wellness Visit scheduled for next year: Yes

## 2023-09-01 ENCOUNTER — Ambulatory Visit (HOSPITAL_COMMUNITY)
Admission: RE | Admit: 2023-09-01 | Discharge: 2023-09-01 | Disposition: A | Discharge status: Home / Self Care | Payer: Medicare Other | Source: Ambulatory Visit | Attending: Otolaryngology | Admitting: Otolaryngology

## 2023-09-01 DIAGNOSIS — J342 Deviated nasal septum: Secondary | ICD-10-CM | POA: Diagnosis not present

## 2023-09-01 DIAGNOSIS — J329 Chronic sinusitis, unspecified: Secondary | ICD-10-CM | POA: Diagnosis not present

## 2023-10-19 ENCOUNTER — Ambulatory Visit (INDEPENDENT_AMBULATORY_CARE_PROVIDER_SITE_OTHER): Payer: Medicare Other | Admitting: Otolaryngology

## 2023-10-19 ENCOUNTER — Encounter (INDEPENDENT_AMBULATORY_CARE_PROVIDER_SITE_OTHER): Payer: Self-pay | Admitting: Otolaryngology

## 2023-10-19 VITALS — BP 178/98 | HR 91

## 2023-10-19 DIAGNOSIS — R519 Headache, unspecified: Secondary | ICD-10-CM | POA: Diagnosis not present

## 2023-10-19 DIAGNOSIS — D140 Benign neoplasm of middle ear, nasal cavity and accessory sinuses: Secondary | ICD-10-CM

## 2023-10-19 DIAGNOSIS — J3089 Other allergic rhinitis: Secondary | ICD-10-CM

## 2023-10-19 DIAGNOSIS — R0981 Nasal congestion: Secondary | ICD-10-CM | POA: Diagnosis not present

## 2023-10-19 DIAGNOSIS — J3489 Other specified disorders of nose and nasal sinuses: Secondary | ICD-10-CM

## 2023-10-19 DIAGNOSIS — R448 Other symptoms and signs involving general sensations and perceptions: Secondary | ICD-10-CM

## 2023-10-19 DIAGNOSIS — J329 Chronic sinusitis, unspecified: Secondary | ICD-10-CM | POA: Diagnosis not present

## 2023-10-19 DIAGNOSIS — J343 Hypertrophy of nasal turbinates: Secondary | ICD-10-CM | POA: Diagnosis not present

## 2023-10-19 DIAGNOSIS — H9313 Tinnitus, bilateral: Secondary | ICD-10-CM

## 2023-10-19 DIAGNOSIS — R0982 Postnasal drip: Secondary | ICD-10-CM | POA: Diagnosis not present

## 2023-10-19 DIAGNOSIS — J342 Deviated nasal septum: Secondary | ICD-10-CM | POA: Diagnosis not present

## 2023-10-19 NOTE — Progress Notes (Signed)
ENT Progress Note:  Update 10/19/23: She reports ringing on both sides for many years, last hearing test 10 yrs ago. She feels headaches and facial pressure improved. On allergy pill and nasal spray currently.   Initial Evaluation 08/24/23   Reason for Consult: chronic sinusitis and facial pain/headache   HPI: Gina Jefferson is an 72 y.o. female with hx of GERD on Prevacid daily, history of environmental allergies on Zyrtec and Flonase, here for chronic versus recurrent sinusitis.  She reports at least three different courses of antibiotics this year for sinus infections. She more recently received Doxycycline 1 mo ago, and she had some improvement while on it, but after she finished sx came back.  Never had oral steroids for her symptoms.  She reports sx of facial pain/pressure frontal headache, post-nasal drainage, bad coughing spits with clear mucus. She had blood tinged drainage, which she coughed up after having post-nasal drainage the other day. She is not sure if it was a nose bleed. She denies recurrent epistaxis. She never had sinus or nasal surgery. No allergy testing before. She takes Zyrtec daily. She is on Flonase as needed. No hx of asthma. Former smoker quit several years ago. She has hx of GERD, takes Prevacid daily at night.     Records Reviewed:  Office visit with Dr Okey Dupre 08/24/23  Sinusitis Associated symptoms include congestion, headaches and sinus pressure. Pertinent negatives include no coughing or shortness of breath.    The patient is a 72YO female coming in for chronic sinus issues. Not really worse than usual lately. Worse left side and poor airflow. Wants to see ENT to consider if there is a procedural option.    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 x 15 yrs.   Seasonal allergies     Past Surgical History:  Procedure Laterality Date   ABDOMINAL HYSTERECTOMY     complete   APPENDECTOMY     BREAST REDUCTION SURGERY  2016   CARPAL TUNNEL RELEASE  11/24/2011   Procedure: CARPAL TUNNEL RELEASE;  Surgeon: Valeria Batman, MD;  Location: [facility];  Service: Orthopedics;  Laterality: Right;  CARPAL TUNNEL REL ON RIGHT    CATARACT EXTRACTION, BILATERAL Bilateral 2018   CESAREAN SECTION     CHOLECYSTECTOMY     COLONOSCOPY     OOPHORECTOMY     OVARIAN CYST REMOVAL     POLYPECTOMY     TONSILLECTOMY     WISDOM TOOTH EXTRACTION      Family History  Problem Relation Age of Onset   Diabetes Mother    Stroke Mother    Heart disease Mother    Hyperlipidemia Mother    Hypertension Mother    Stroke Father    Alzheimer's disease Father    Mental illness Father    Hypertension Father    Hyperlipidemia Father    Cancer Maternal Grandfather        stomach cancer   Colon cancer Neg Hx     Social History:  reports that she quit smoking about 19 years ago. Her smoking use included cigarettes. She has never used smokeless tobacco. She reports that  she does not drink alcohol and does not use drugs.  Allergies:  Allergies  Allergen Reactions   Clindamycin/Lincomycin Rash   Ciprofloxacin Rash   Penicillins Rash   Statins Other (See Comments)    Muscle aches   Metformin Diarrhea and Other (See Comments)    Vomiting     Medications: I have reviewed the patient's current medications.  The PMH, PSH, Medications, Allergies, and SH were reviewed and updated.  ROS: Constitutional: Negative for fever, weight loss and weight gain. Cardiovascular: Negative for chest pain and dyspnea on exertion. Respiratory: Is not experiencing shortness of breath at rest. Gastrointestinal: Negative for nausea and vomiting. Neurological: Negative for headaches. Psychiatric: The patient is not  nervous/anxious  Blood pressure (!) 178/98, pulse 91, SpO2 94%.  PHYSICAL EXAM:  Exam: General: Well-developed, well-nourished Respiratory Respiratory effort: Equal inspiration and expiration without stridor Cardiovascular Peripheral Vascular: Warm extremities with equal color/perfusion Eyes: No nystagmus with equal extraocular motion bilaterally Neuro/Psych/Balance: Patient oriented to person, place, and time; Appropriate mood and affect; Gait is intact with no imbalance; Cranial nerves I-XII are intact Head and Face Inspection: Normocephalic and atraumatic without mass or lesion Facial Strength: Facial motility symmetric and full bilaterally ENT Pinna: External ear intact and fully developed External canal: Canal is patent with intact skin Tympanic Membrane: Clear and mobile External Nose: No scar or anatomic deformity Internal Nose: Septum is deviated to the left on anterior rhinoscopy. Bilateral inferior turbinate hypertrophy.  Lips, Teeth, and gums: Mucosa and teeth intact and viable TMJ: No pain to palpation with full mobility Oral cavity/oropharynx: No erythema or exudate, no lesions present Nasopharynx: No mass or lesion with intact mucosa Neck Neck and Trachea: Midline trachea without mass or lesion Thyroid: No mass or nodularity Lymphatics: No lymphadenopathy  Procedure performed during last office visit    PROCEDURE NOTE: nasal endoscopy  Preoperative diagnosis: chronic sinusitis symptoms  Postoperative diagnosis: same  Procedure: Diagnostic nasal endoscopy (40981)  Surgeon: Ashok Croon, M.D.  Anesthesia: Topical lidocaine and Afrin  H&P REVIEW: The patient's history and physical were reviewed today prior to procedure. All medications were reviewed and updated as well. Complications: None Condition is stable throughout exam Indications and consent: The patient presents with symptoms of chronic sinusitis not responding to previous therapies. All the  risks, benefits, and potential complications were reviewed with the patient preoperatively and informed consent was obtained. The time out was completed with confirmation of the correct procedure.   Procedure: The patient was seated upright in the clinic. Topical lidocaine and Afrin were applied to the nasal cavity. After adequate anesthesia had occurred, the rigid nasal endoscope was passed into the nasal cavity. The nasal mucosa, turbinates, septum, and sinus drainage pathways were visualized bilaterally. This revealed no purulence or significant secretions that might be cultured. There were no polyps or sites of significant inflammation. The mucosa was intact and there was no crusting present. The scope was then slowly withdrawn and the patient tolerated the procedure well. There were no complications or blood loss.  Studies Reviewed:CT sinuses 09/01/23 IMPRESSION: 1. Clear paranasal sinuses and patent outflow. 2. Leftward nasal septal deviation.      Assessment/Plan: Encounter Diagnoses  Name Primary?   Rhinorrhea Yes   Nonintractable headache, unspecified chronicity pattern, unspecified headache type    Environmental and seasonal allergies    Deviated nasal septum    Facial pressure    Benign neoplasm of inferior turbinate    Nasal congestion    Chronic sinusitis, unspecified location  1.Chronic versus recurrent sinusitis - requiring multiple courses of antibiotics with residual symptoms of facial pain pressure frontal headaches nasal congestion postnasal drainage, no prior imaging of the sinuses - schedule CT sinuses to evaluate for chronic sinus inflammation - start nasal rinses   2.  Nasal congestion postnasal drainage environmental allergies - stop Zyrtec and start Clarinex 5 mg daily for allergies - continue Flonase twice daily 2 puffs each naris  3.  Nasal obstruction deviated nasal septum inferior turban hypertrophy -Medical management as above, will consider other  interventions such as surgery in the future if fails maximal medical management  Update 10/19/23    1.Chronic nasal congestion and facial/pain and pressure - requiring multiple courses of antibiotics with residual symptoms of facial pain pressure frontal headaches nasal congestion postnasal drainage, CT sinuses which I personally reviewed with the patient with clear paranasal sinuses and evidence of L NSD and ITH noted on exam  - medical management of allergies and nasal congestion as below  2.  Nasal congestion postnasal drainage environmental allergies - continue Clarinex 5 mg daily for allergies - continue Flonase twice daily 2 puffs each naris  3.  Nasal obstruction deviated nasal septum inferior turbinate hypertrophy -Medical management as above, will consider other interventions such as surgery in the future if fails maximal medical management  4. Bilateral constant tinnitus, without hearing impairment. Offered audiogram, but patient would like to hold off - will monitor  Ashok Croon, MD Otolaryngology [facility] Phone: [phone] Fax: [phone]    10/19/2023, 8:40 AM

## 2023-12-22 DIAGNOSIS — H35363 Drusen (degenerative) of macula, bilateral: Secondary | ICD-10-CM | POA: Diagnosis not present

## 2023-12-22 DIAGNOSIS — H40013 Open angle with borderline findings, low risk, bilateral: Secondary | ICD-10-CM | POA: Diagnosis not present

## 2024-01-04 DIAGNOSIS — Z1231 Encounter for screening mammogram for malignant neoplasm of breast: Secondary | ICD-10-CM | POA: Diagnosis not present

## 2024-01-04 LAB — HM MAMMOGRAPHY

## 2024-04-13 ENCOUNTER — Other Ambulatory Visit: Payer: Self-pay | Admitting: Internal Medicine

## 2024-06-18 ENCOUNTER — Telehealth: Payer: Self-pay

## 2024-06-18 ENCOUNTER — Encounter: Payer: Self-pay | Admitting: Internal Medicine

## 2024-06-18 MED ORDER — LANSOPRAZOLE 30 MG PO CPDR: 30.0000 mg | DELAYED_RELEASE_CAPSULE | Freq: Every day | ORAL | 2 refills | Status: AC

## 2024-06-18 NOTE — Telephone Encounter (Signed)
 Prevacid  refilled as patient requested.

## 2024-06-19 ENCOUNTER — Encounter: Payer: Self-pay | Admitting: Internal Medicine

## 2024-06-20 ENCOUNTER — Encounter: Payer: Self-pay | Admitting: Obstetrics and Gynecology

## 2024-06-20 DIAGNOSIS — H04123 Dry eye syndrome of bilateral lacrimal glands: Secondary | ICD-10-CM | POA: Diagnosis not present

## 2024-06-20 DIAGNOSIS — H40013 Open angle with borderline findings, low risk, bilateral: Secondary | ICD-10-CM | POA: Diagnosis not present

## 2024-06-20 DIAGNOSIS — H35373 Puckering of macula, bilateral: Secondary | ICD-10-CM | POA: Diagnosis not present

## 2024-06-20 DIAGNOSIS — H524 Presbyopia: Secondary | ICD-10-CM | POA: Diagnosis not present

## 2024-06-20 DIAGNOSIS — H16223 Keratoconjunctivitis sicca, not specified as Sjogren's, bilateral: Secondary | ICD-10-CM | POA: Diagnosis not present

## 2024-06-29 ENCOUNTER — Other Ambulatory Visit: Payer: Self-pay | Admitting: Internal Medicine

## 2024-07-02 ENCOUNTER — Encounter: Payer: Self-pay | Admitting: Internal Medicine

## 2024-07-04 DIAGNOSIS — M25561 Pain in right knee: Secondary | ICD-10-CM | POA: Diagnosis not present

## 2024-07-04 DIAGNOSIS — M2550 Pain in unspecified joint: Secondary | ICD-10-CM | POA: Diagnosis not present

## 2024-07-04 DIAGNOSIS — M25562 Pain in left knee: Secondary | ICD-10-CM | POA: Diagnosis not present

## 2024-07-04 DIAGNOSIS — R5383 Other fatigue: Secondary | ICD-10-CM | POA: Diagnosis not present

## 2024-07-05 MED ORDER — ALPRAZOLAM 0.25 MG PO TABS: 0.2500 mg | ORAL_TABLET | Freq: Every day | ORAL | 0 refills | Status: DC | PRN

## 2024-07-23 ENCOUNTER — Other Ambulatory Visit (INDEPENDENT_AMBULATORY_CARE_PROVIDER_SITE_OTHER): Payer: Self-pay | Admitting: Otolaryngology

## 2024-08-18 ENCOUNTER — Other Ambulatory Visit (INDEPENDENT_AMBULATORY_CARE_PROVIDER_SITE_OTHER): Payer: Self-pay | Admitting: Otolaryngology

## 2024-09-04 ENCOUNTER — Ambulatory Visit

## 2024-09-09 ENCOUNTER — Ambulatory Visit

## 2024-09-09 ENCOUNTER — Other Ambulatory Visit (HOSPITAL_COMMUNITY): Payer: Self-pay

## 2024-09-09 VITALS — BP 142/76 | HR 64 | Ht 63.0 in | Wt 149.8 lb

## 2024-09-09 DIAGNOSIS — Z0000 Encounter for general adult medical examination without abnormal findings: Secondary | ICD-10-CM

## 2024-09-09 MED ORDER — COVID-19 MRNA VAC-TRIS(PFIZER) 30 MCG/0.3ML IM SUSY
PREFILLED_SYRINGE | INTRAMUSCULAR | 0 refills | Status: DC
  Filled 2024-09-09: qty 0.3, 1d supply, fill #0

## 2024-09-09 NOTE — Patient Instructions (Addendum)
 Gina Jefferson,  Thank you for taking the time for your Medicare Wellness Visit. I appreciate your continued commitment to your health goals. Please review the care plan we discussed, and feel free to reach out if I can assist you further.  Medicare recommends these wellness visits once per year to help you and your care team stay ahead of potential health issues. These visits are designed to focus on prevention, allowing your provider to concentrate on managing your acute and chronic conditions during your regular appointments.  Please note that Annual Wellness Visits do not include a physical exam. Some assessments may be limited, especially if the visit was conducted virtually. If needed, we may recommend a separate in-person follow-up with your provider.  Ongoing Care Seeing your primary care provider every 3 to 6 months helps us  monitor your health and provide consistent, personalized care.   Referrals If a referral was made during today's visit and you haven't received any updates within two weeks, please contact the referred provider directly to check on the status.  Recommended Screenings:  Health Maintenance  Topic Date Due   Zoster (Shingles) Vaccine (1 of 2) 01/01/2002   DTaP/Tdap/Td vaccine (3 - Td or Tdap) 02/26/2025   COVID-19 Vaccine (7 - 2025-26 season) 03/10/2025   Colon Cancer Screening  07/19/2025   Medicare Annual Wellness Visit  09/09/2025   Breast Cancer Screening  01/03/2026   Pneumococcal Vaccine for age over 66  Completed   Flu Shot  Completed   DEXA scan (bone density measurement)  Completed   Hepatitis C Screening  Completed   Meningitis B Vaccine  Aged Out       09/09/2024    1:55 PM  Advanced Directives  Does Patient Have a Medical Advance Directive? No  Would patient like information on creating a medical advance directive? No - Patient declined   Advance Care Planning is important because it: Ensures you receive medical care that aligns with your  values, goals, and preferences. Provides guidance to your family and loved ones, reducing the emotional burden of decision-making during critical moments.  Vision: Annual vision screenings are recommended for early detection of glaucoma, cataracts, and diabetic retinopathy. These exams can also reveal signs of chronic conditions such as diabetes and high blood pressure.  Dental: Annual dental screenings help detect early signs of oral cancer, gum disease, and other conditions linked to overall health, including heart disease and diabetes.

## 2024-09-09 NOTE — Progress Notes (Signed)
 Subjective:   Gina Jefferson is a 73 y.o. who presents for a Medicare Wellness preventive visit.  As a reminder, Annual Wellness Visits don't include a physical exam, and some assessments may be limited, especially if this visit is performed virtually. We may recommend an in-person follow-up visit with your provider if needed.  Visit Complete: In person  Persons Participating in Visit: Patient.  AWV Questionnaire: Yes: Patient Medicare AWV questionnaire was completed by the patient on 09/05/2024; I have confirmed that all information answered by patient is correct and no changes since this date.  Cardiac Risk Factors include: advanced age (>58men, >42 women);dyslipidemia;hypertension     Objective:    Today's Vitals   09/09/24 1352  BP: (!) 142/76  Pulse: 64  SpO2: 97%  Weight: 149 lb 12.8 oz (67.9 kg)  Height: 5' 3 (1.6 m)   Body mass index is 26.54 kg/m.     09/09/2024    1:55 PM 08/31/2023   11:53 AM 09/21/2022   10:35 AM 02/23/2018    9:56 AM 11/17/2011    3:58 PM  Advanced Directives  Does Patient Have a Medical Advance Directive? No Yes Yes Yes  Patient has advance directive, copy not in chart   Type of Advance Directive  Healthcare Power of [place];Living will Healthcare Power of [place];Living will Healthcare Power of [place];Living will Living will;Healthcare Power of Attorney   Copy of Healthcare Power of Attorney in Chart?  No - copy requested No - copy requested No - copy requested    Would patient like information on creating a medical advance directive? No - Patient declined         Data saved with a previous flowsheet row definition    Current Medications (verified) Outpatient Encounter Medications as of 09/09/2024  Medication Sig   albuterol  (VENTOLIN  HFA) 108 (90 Base) MCG/ACT inhaler Inhale 2 puffs into the lungs every 6 (six) hours as needed for wheezing or shortness of breath.   ALPRAZolam  (XANAX ) 0.25 MG tablet Take 1 tablet (0.25 mg total) by  mouth daily as needed for anxiety.   Cholecalciferol (VITAMIN D  PO) Take 5,000 Units by mouth daily.   COVID-19 mRNA vaccine, Pfizer, (COMIRNATY) syringe Inject into the muscle.   desloratadine  (CLARINEX ) 5 MG tablet TAKE 1 TABLET(5 MG) BY MOUTH DAILY   diclofenac (VOLTAREN) 50 MG EC tablet Take 50 mg by mouth daily as needed.   Diclofenac Sodium 3 % GEL Reported on 05/05/2016   dicyclomine  (BENTYL ) 20 MG tablet TAKE 1 TABLET(20 MG) BY MOUTH TWICE DAILY AS NEEDED FOR SPASMS OR ABDOMINAL PAIN   fluticasone  (FLONASE ) 50 MCG/ACT nasal spray SHAKE LIQUID AND USE 2 SPRAYS IN EACH NOSTRIL DAILY   lansoprazole  (PREVACID ) 30 MG capsule Take 1 capsule (30 mg total) by mouth at bedtime.   meclizine  (ANTIVERT ) 12.5 MG tablet Take 1 tablet (12.5 mg total) by mouth 3 (three) times daily as needed for dizziness.   ondansetron  (ZOFRAN ) 4 MG tablet 1 tablet by mouth every 6 to 8 hours as needed for nausea   traZODone  (DESYREL ) 50 MG tablet TAKE 1/2 TO 1 TABLET(25 TO 50 MG) BY MOUTH AT BEDTIME AS NEEDED FOR SLEEP (Patient not taking: Reported on 09/09/2024)   valsartan  (DIOVAN ) 320 MG tablet TAKE 1 TABLET(320 MG) BY MOUTH DAILY   [DISCONTINUED] Ascorbic Acid (VITAMIN C) 500 MG CAPS Take 500 mg by mouth daily.   [DISCONTINUED] Biotin 1000 MCG tablet Take 1,000 mcg by mouth daily.   [DISCONTINUED] cetirizine (ZYRTEC) 10  MG tablet Take 10 mg by mouth daily. (Patient not taking: Reported on 10/19/2023)   [DISCONTINUED] Garlic 1000 MG CAPS Take by mouth daily.   [DISCONTINUED] sodium chloride  (OCEAN) 0.65 % SOLN nasal spray Place 1 spray into both nostrils as needed.   No facility-administered encounter medications on file as of 09/09/2024.    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 x 15 yrs.   Seasonal allergies    Past Surgical History:  Procedure Laterality Date   ABDOMINAL HYSTERECTOMY     complete   APPENDECTOMY     BREAST REDUCTION SURGERY  2016   CARPAL TUNNEL RELEASE  11/24/2011   Procedure: CARPAL TUNNEL RELEASE;  Surgeon: Maude LELON Right, MD;  Location: [facility];  Service: Orthopedics;  Laterality: Right;  CARPAL TUNNEL REL ON RIGHT    CATARACT EXTRACTION, BILATERAL Bilateral 2018   CESAREAN SECTION     CHOLECYSTECTOMY     COLONOSCOPY     OOPHORECTOMY     OVARIAN CYST REMOVAL     POLYPECTOMY     TONSILLECTOMY     WISDOM TOOTH EXTRACTION     Family History  Problem Relation Age of Onset   Diabetes Mother    Stroke Mother    Heart disease Mother    Hyperlipidemia Mother    Hypertension Mother    Stroke Father    Alzheimer's disease Father    Mental illness Father    Hypertension Father    Hyperlipidemia Father    Cancer Maternal Grandfather        stomach cancer   Colon cancer Neg Hx    Social History   Socioeconomic History   Marital status: Married    Spouse name: Not on file   Number of children: Not on file   Years of education: Not on file   Highest education level: Associate degree: occupational, Scientist, product/process development, or vocational program  Occupational History   Not on file  Tobacco Use   Smoking status: Former    Current packs/day: 0.00    Types: Cigarettes    Quit date: 11/29/2003    Years since quitting: 20.7   Smokeless tobacco: Never  Vaping Use   Vaping status: Never Used  Substance and Sexual Activity   Alcohol use: Yes    Alcohol/week: 1.0 standard drink of alcohol    Types: 1 Glasses of wine per week    Comment: occasionally   Drug use: No   Sexual activity: Not Currently  Other Topics Concern   Not on file  Social History Narrative   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   Lives  with husband.  She has two adult children.    Social Drivers of Corporate investment banker Strain: Low Risk  (09/09/2024)   Overall Financial Resource Strain (CARDIA)    Difficulty of Paying Living Expenses: Not hard at all  Food Insecurity: No Food Insecurity (09/09/2024)   Hunger Vital Sign    Worried About Running Out of Food in the Last Year: Never true    Ran Out of Food in the  Last Year: Never true  Transportation Needs: No Transportation Needs (09/09/2024)   PRAPARE - Administrator, Civil Service (Medical): No    Lack of Transportation (Non-Medical): No  Physical Activity: Sufficiently Active (09/09/2024)   Exercise Vital Sign    Days of Exercise per Week: 7 days    Minutes of Exercise per Session: 30 min  Stress: Stress Concern Present (09/09/2024)   [employer] of Occupational Health - Occupational Stress Questionnaire    Feeling of Stress: To some extent  Social Connections: Socially Integrated (09/09/2024)   Social Connection and Isolation Panel    Frequency of Communication with Friends and Family: More than three times a week    Frequency of Social Gatherings with Friends and Family: More than three times a week    Attends Religious Services: More than 4 times per year    Active Member of [employer] or Organizations: Yes    Attends Engineer, structural: More than 4 times per year    Marital Status: Married    Tobacco Counseling Counseling given: Not Answered    Clinical Intake:  Pre-visit preparation completed: Yes  Pain : No/denies pain     BMI - recorded: 26.54 Nutritional Status: BMI 25 -29 Overweight Nutritional Risks: None Diabetes: No  Lab Results  Component Value Date   HGBA1C 6.2 09/13/2021   HGBA1C 5.9 (H) 06/29/2020   HGBA1C 5.8 05/16/2019     How often do you need to have someone help you when you read instructions, pamphlets, or other written materials from your doctor or pharmacy?: 1 - Never  Interpreter  Needed?: No  Information entered by :: Gina Jefferson Saba, CMA   Activities of Daily Living     09/09/2024    1:58 PM 09/05/2024    6:43 PM  In your present state of health, do you have any difficulty performing the following activities:  Hearing? 0 0  Vision? 0 0  Difficulty concentrating or making decisions? 0 0  Walking or climbing stairs? 0 0  Dressing or bathing? 0 0  Doing errands, shopping? 0 0  Preparing Food and eating ? N N  Using the Toilet? N N  In the past six months, have you accidently leaked urine? Gina Jefferson Gina Jefferson  Comment wears a pantyliner   Do you have problems with loss of bowel control? N N  Managing your Medications? N N  Managing your Finances? N N  Housekeeping or managing your Housekeeping? N N    Patient Care Team: Rollene Almarie LABOR, MD as PCP - General (Internal Medicine) Avram Lupita BRAVO, MD as Consulting Physician (Gastroenterology) Mat Browning, MD as Consulting Physician (Obstetrics and Gynecology) Gaspar Kung, MD as Consulting Physician (Orthopedic Surgery) Tommas Pears, MD (Endocrinology) Fate Morna SAILOR, [facility] (Inactive) as Pharmacist (Pharmacist) Cleatus Collar, MD as Consulting Physician (Ophthalmology)  I have updated your Care Teams any recent Medical Services you may have received from other providers in the past year.     Assessment:   This is a routine wellness examination for Gina Jefferson.  Hearing/Vision screen Hearing Screening - Comments:: Denies hearing difficulties   Vision Screening - Comments:: Wears rx glasses - up to date with routine eye exams with Collar Cleatus   Goals Addressed               This Visit's Progress     Patient Stated (pt-stated)        Patient stated she plans to continue monitoring her bp readings - checks bp  at home       Depression Screen     09/09/2024    1:59 PM 08/31/2023   11:48 AM 07/13/2023    9:01 AM 05/01/2023   10:48 AM 02/02/2023   10:49 AM 09/21/2022   10:36 AM 09/13/2021    2:10 PM   PHQ 2/9 Scores  PHQ - 2 Score 0 0 1 0 0 0 0  PHQ- 9 Score 0 0 3  0      Fall Risk     09/09/2024    1:59 PM 09/05/2024    6:43 PM 08/31/2023   11:52 AM 08/27/2023    2:27 PM 08/21/2023    3:21 PM  Fall Risk   Falls in the past year? 0 0 0 0 0  Number falls in past yr: 0 0 0 0 0  Injury with Fall? 0 0 0 0 0  Risk for fall due to : No Fall Risks  No Fall Risks No Fall Risks   Follow up Falls evaluation completed;Falls prevention discussed  Falls prevention discussed Falls prevention discussed     MEDICARE RISK AT HOME:  Medicare Risk at Home Any stairs in or around the home?: Yes If so, are there any without handrails?: Yes Home free of loose throw rugs in walkways, pet beds, electrical cords, etc?: Yes Adequate lighting in your home to reduce risk of falls?: Yes Life alert?: No Use of a cane, walker or w/c?: No Grab bars in the bathroom?: Yes Shower chair or bench in shower?: No Elevated toilet seat or a handicapped toilet?: Yes  TIMED UP AND GO:  Was the test performed?  No  Cognitive Function: 6CIT completed        09/09/2024    2:01 PM 08/31/2023   11:53 AM 09/21/2022   10:39 AM  6CIT Screen  What Year? 0 points 0 points 0 points  What month? 0 points 0 points 0 points  What time? 0 points 0 points 0 points  Count back from 20 0 points 0 points 0 points  Months in reverse 0 points 0 points 0 points  Repeat phrase 0 points 0 points 0 points  Total Score 0 points 0 points 0 points    Immunizations Immunization History  Administered Date(s) Administered   Fluad Quad(high Dose 65+) 08/21/2019, 08/27/2020, 08/31/2021, 09/11/2022   Fluad Trivalent(High Dose 65+) 09/08/2024   Influenza Split 08/28/2012   Influenza Whole 08/08/2005, 08/19/2008, 09/28/2009, 09/28/2010   Influenza,inj,Quad PF,6+ Mos 08/28/2013, 09/11/2014, 09/06/2016   Influenza-Unspecified 08/12/2013, 09/12/2015, 09/07/2017, 09/11/2018, 04/09/2020, 08/31/2021, 09/11/2022   PFIZER(Purple  Top)SARS-COV-2 Vaccination 12/07/2019, 12/26/2019, 09/18/2020   Pfizer Covid-19 Vaccine Bivalent Booster 50yrs & up 08/19/2021   Pfizer(Comirnaty)Fall Seasonal Vaccine 12 years and older 09/09/2024   Pneumococcal Conjugate-13 02/21/2017   Pneumococcal Polysaccharide-23 01/16/2006, 02/23/2018   Pneumococcal-Unspecified 02/21/2017   Td 01/17/2005   Tdap 02/27/2015   Unspecified SARS-COV-2 Vaccination 08/19/2021   Zoster, Live 01/15/2013    Screening Tests Health Maintenance  Topic Date Due   Zoster Vaccines- Shingrix (1 of 2) 01/01/2002   DTaP/Tdap/Td (3 - Td or Tdap) 02/26/2025   COVID-19 Vaccine (7 - 2025-26 season) 03/10/2025   Colonoscopy  07/19/2025   Medicare Annual Wellness (AWV)  09/09/2025   Mammogram  01/03/2026   Pneumococcal Vaccine: 50+ Years  Completed   Influenza Vaccine  Completed   DEXA SCAN  Completed   Hepatitis C Screening  Completed   Meningococcal B Vaccine  Aged Out    Health Maintenance Items  Addressed:  09/09/2024  Additional Screening:  Vision Screening: Recommended annual ophthalmology exams for early detection of glaucoma and other disorders of the eye. Is the patient up to date with their annual eye exam?  Yes  Who is the provider or what is the name of the office in which the patient attends annual eye exams? [facility]  Dental Screening: Recommended annual dental exams for proper oral hygiene  Community Resource Referral / Chronic Care Management: CRR required this visit?  No   CCM required this visit?  No   Plan:    I have personally reviewed and noted the following in the patient's chart:   Medical and social history Use of alcohol, tobacco or illicit drugs  Current medications and supplements including opioid prescriptions. Patient is not currently taking opioid prescriptions. Functional ability and status Nutritional status Physical activity Advanced directives List of other physicians Hospitalizations, surgeries, and ER  visits in previous 12 months Vitals Screenings to include cognitive, depression, and falls Referrals and appointments  In addition, I have reviewed and discussed with patient certain preventive protocols, quality metrics, and best practice recommendations. A written personalized care plan for preventive services as well as general preventive health recommendations were provided to patient.   Gina Jefferson CHRISTELLA Saba, CMA   09/09/2024   After Visit Summary: (In Person-Declined) Patient declined AVS at this time.  Notes: Scheduled a 1-yr appt w/PCP for 08/2024.  Pt d/c'ed BP med on her own.

## 2024-09-20 ENCOUNTER — Telehealth: Payer: Self-pay | Admitting: Pharmacist

## 2024-09-20 NOTE — Progress Notes (Signed)
 Pharmacy Quality Measure Review  This patient is appearing on a report for being at risk of failing the adherence measure for hypertension (ACEi/ARB) medications this calendar year.   Medication: valsartan  320 mg Last fill date: 04/25/24 for 90 day supply  Called and spoke to patient regarding valsartan  being overdue for refill. Discussed barriers to adherence, which included pt reports she self discontinued valsartan  due to experiencing worsened osteoarthritis pain which she contributed to valsartan  use. Pt reports she noticed an improvement in her symptoms once she stopped valsartan .   She has upcoming PCP f/u in 1 week. Will update medication list and inform PCP of the above to guide any needed treatment decisions at her appt next week.  Will fail [facility] quality metric for this year. No further action needed.  Darrelyn Drum, PharmD, BCPS, CPP Clinical Pharmacist Practitioner [facility] [phone]

## 2024-09-27 ENCOUNTER — Ambulatory Visit: Admitting: Internal Medicine

## 2024-09-27 ENCOUNTER — Encounter: Payer: Self-pay | Admitting: Internal Medicine

## 2024-09-27 VITALS — BP 132/84 | HR 102 | Temp 97.8°F | Ht 63.0 in | Wt 148.0 lb

## 2024-09-27 DIAGNOSIS — I10 Essential (primary) hypertension: Secondary | ICD-10-CM | POA: Diagnosis not present

## 2024-09-27 DIAGNOSIS — E782 Mixed hyperlipidemia: Secondary | ICD-10-CM

## 2024-09-27 DIAGNOSIS — Z0000 Encounter for general adult medical examination without abnormal findings: Secondary | ICD-10-CM

## 2024-09-27 DIAGNOSIS — R7301 Impaired fasting glucose: Secondary | ICD-10-CM

## 2024-09-27 DIAGNOSIS — E010 Iodine-deficiency related diffuse (endemic) goiter: Secondary | ICD-10-CM

## 2024-09-27 DIAGNOSIS — F4322 Adjustment disorder with anxiety: Secondary | ICD-10-CM

## 2024-09-27 LAB — COMPREHENSIVE METABOLIC PANEL WITH GFR
ALT: 17 U/L (ref 0–35)
AST: 22 U/L (ref 0–37)
Albumin: 3.9 g/dL (ref 3.5–5.2)
Alkaline Phosphatase: 91 U/L (ref 39–117)
BUN: 12 mg/dL (ref 6–23)
CO2: 26 meq/L (ref 19–32)
Calcium: 9.4 mg/dL (ref 8.4–10.5)
Chloride: 106 meq/L (ref 96–112)
Creatinine, Ser: 0.8 mg/dL (ref 0.40–1.20)
GFR: 73.45 mL/min (ref >60.00)
Glucose, Bld: 136 mg/dL — ABNORMAL HIGH (ref 70–99)
Potassium: 3.6 meq/L (ref 3.5–5.1)
Sodium: 140 meq/L (ref 135–145)
Total Bilirubin: 0.7 mg/dL (ref 0.2–1.2)
Total Protein: 7.1 g/dL (ref 6.0–8.3)

## 2024-09-27 LAB — CBC
HCT: 42.7 % (ref 36.0–46.0)
Hemoglobin: 14.1 g/dL (ref 12.0–15.0)
MCHC: 33 g/dL (ref 30.0–36.0)
MCV: 95.6 fl (ref 78.0–100.0)
Platelets: 187 K/uL (ref 150.0–400.0)
RBC: 4.46 Mil/uL (ref 3.87–5.11)
RDW: 13.9 % (ref 11.5–15.5)
WBC: 5 K/uL (ref 4.0–10.5)

## 2024-09-27 LAB — HEMOGLOBIN A1C: Hgb A1c MFr Bld: 6.1 % (ref 4.6–6.5)

## 2024-09-27 LAB — LIPID PANEL
Cholesterol: 246 mg/dL — ABNORMAL HIGH (ref 0–200)
HDL: 55.3 mg/dL (ref >39.00)
LDL Cholesterol: 165 mg/dL — ABNORMAL HIGH (ref 0–99)
NonHDL: 190.91
Total CHOL/HDL Ratio: 4
Triglycerides: 128 mg/dL (ref 0.0–149.0)
VLDL: 25.6 mg/dL (ref 0.0–40.0)

## 2024-09-27 LAB — TSH: TSH: 0.95 u[IU]/mL (ref 0.35–5.50)

## 2024-09-27 MED ORDER — ALPRAZOLAM 0.25 MG PO TABS: 0.2500 mg | ORAL_TABLET | Freq: Every day | ORAL | 5 refills | Status: AC | PRN

## 2024-09-27 MED ORDER — TRIAMCINOLONE ACETONIDE 0.1 % EX CREA: 1.0000 | TOPICAL_CREAM | Freq: Two times a day (BID) | CUTANEOUS | 0 refills | Status: AC

## 2024-09-27 NOTE — Assessment & Plan Note (Signed)
 Checking HgA1c and adjust as needed.

## 2024-09-27 NOTE — Assessment & Plan Note (Signed)
 Checking lipid panel and adjust as needed.

## 2024-09-27 NOTE — Assessment & Plan Note (Signed)
 BP at goal off meds for several months now. Checking CMP and lipid panel and adjust as needed.

## 2024-09-27 NOTE — Assessment & Plan Note (Signed)
Checking TSH and adjust as needed.  

## 2024-09-27 NOTE — Assessment & Plan Note (Signed)
 Flu shot up to date. Pneumonia complete. Shingrix due at pharmacy. Tetanus up to date. Colonoscopy up to date. Mammogram up to date, pap smear aged out and dexa complete. Counseled about sun safety and mole surveillance. Counseled about the dangers of distracted driving. Given 10 year screening recommendations.

## 2024-09-27 NOTE — Progress Notes (Signed)
   Subjective:   Patient ID: Gina Jefferson, female    DOB: [birth year], 73 y.o.   MRN: [id number]  The patient is here for physical. Pertinent topics discussed: Discussed the use of AI scribe software for clinical note transcription with the patient, who gave verbal consent to proceed.  History of Present Illness Gina Jefferson is a 73 year old female who presents with new onset of itchy skin welts.  She has been experiencing itchy welts for approximately two to three weeks, primarily affecting her arms, hands, thighs, and the back of her legs. The welts are very itchy upon appearance but tend to fade if she refrains from scratching. She uses Benadryl for relief from itching and Xanax  to manage stress, which she believes is contributing to the skin condition. No changes in diet, clothing, or use of new products have been noted that could account for the welts.  She is under significant stress due to her son's ongoing murder trial, which has been postponed multiple times, causing emotional distress. She stopped taking her blood pressure medication two months ago and reports feeling better since then, with her blood pressure remaining stable.  PMH, [facility], social history reviewed and updated  Review of Systems  Constitutional: Negative.   HENT: Negative.    Eyes: Negative.   Respiratory:  Negative for cough, chest tightness and shortness of breath.   Cardiovascular:  Negative for chest pain, palpitations and leg swelling.  Gastrointestinal:  Negative for abdominal distention, abdominal pain, constipation, diarrhea, nausea and vomiting.  Musculoskeletal: Negative.   Skin:  Positive for rash.  Neurological: Negative.   Psychiatric/Behavioral: Negative.      Objective:  Physical Exam Constitutional:      Appearance: She is well-developed.  HENT:     Head: Normocephalic and atraumatic.  Cardiovascular:     Rate and Rhythm: Normal rate and regular rhythm.  Pulmonary:     Effort: Pulmonary  effort is normal. No respiratory distress.     Breath sounds: Normal breath sounds. No wheezing or rales.  Abdominal:     General: Bowel sounds are normal. There is no distension.     Palpations: Abdomen is soft.     Tenderness: There is no abdominal tenderness.  Musculoskeletal:     Cervical back: Normal range of motion.  Skin:    General: Skin is warm and dry.  Neurological:     Mental Status: She is alert and oriented to person, place, and time.     Coordination: Coordination normal.     Vitals:   09/27/24 1015  BP: 132/84  Pulse: (!) 102  Temp: 97.8 F (36.6 C)  TempSrc: Temporal  SpO2: 96%  Weight: 148 lb (67.1 kg)  Height: 5' 3 (1.6 m)    Assessment & Plan:

## 2024-09-27 NOTE — Assessment & Plan Note (Signed)
 Using alprazolam  rarely and refilled today. Using trazodone  for sleep which is effective. Continue

## 2024-09-29 LAB — ENA+DNA/DS+SJORGEN'S
ENA RNP Ab: <0.2 AI (ref 0.0–0.9)
ENA SM Ab Ser-aCnc: 0.2 AI (ref 0.0–0.9)
ENA SSA (RO) Ab: <0.2 AI (ref 0.0–0.9)
ENA SSB (LA) Ab: 1 AI — ABNORMAL HIGH (ref 0.0–0.9)
dsDNA Ab: 1 [IU]/mL (ref 0–9)

## 2024-09-29 LAB — ANA W/REFLEX: Anti Nuclear Antibody (ANA): POSITIVE — AB

## 2024-10-02 ENCOUNTER — Ambulatory Visit: Payer: Self-pay | Admitting: Internal Medicine

## 2024-10-07 MED ORDER — REPATHA SURECLICK 140 MG/ML ~~LOC~~ SOAJ: 140.0000 mg | SUBCUTANEOUS | 2 refills | Status: DC

## 2024-10-09 ENCOUNTER — Other Ambulatory Visit (HOSPITAL_COMMUNITY): Payer: Self-pay

## 2024-10-09 ENCOUNTER — Telehealth: Payer: Self-pay

## 2024-10-09 NOTE — Telephone Encounter (Signed)
 Pharmacy Patient Advocate Encounter   Received notification from Onbase that prior authorization for Repatha SureClick 140MG /ML auto-injectors is required/requested.   Insurance verification completed.   The patient is insured through [facility].   Per test claim: PA required; PA submitted to above mentioned insurance via Latent Key/confirmation #/EOC [id number] Status is pending

## 2024-10-09 NOTE — Telephone Encounter (Signed)
 Copied from CRM [id number]. Topic: Clinical - Medication Prior Auth >> Oct 09, 2024  2:36 PM Viola FALCON wrote: Kineta from [employer] team called regrading the Repatha, it needs a prior authoriozation and they would like a call back with an update at [phone]

## 2024-10-10 NOTE — Telephone Encounter (Signed)
 Pharmacy Patient Advocate Encounter  Received notification from [facility] that Prior Authorization for Repatha SureClick 140MG /ML auto-injectors  has been APPROVED from 10/10/2024 to 04/09/2025   PA #/Case ID/Reference #: [id number]

## 2024-10-11 ENCOUNTER — Other Ambulatory Visit (HOSPITAL_COMMUNITY): Payer: Self-pay

## 2024-12-16 ENCOUNTER — Encounter: Payer: Self-pay | Admitting: Internal Medicine

## 2024-12-16 DIAGNOSIS — E782 Mixed hyperlipidemia: Secondary | ICD-10-CM

## 2024-12-16 MED ORDER — REPATHA SURECLICK 140 MG/ML ~~LOC~~ SOAJ: 140.0000 mg | SUBCUTANEOUS | 11 refills | Status: AC

## 2024-12-25 ENCOUNTER — Other Ambulatory Visit: Payer: Self-pay | Admitting: Internal Medicine

## 2025-09-29 ENCOUNTER — Ambulatory Visit

## 2025-09-29 ENCOUNTER — Encounter: Admitting: Internal Medicine
